# Patient Record
Sex: Female | Born: 1960
Health system: Southern US, Community
[De-identification: ages and names within clinical notes are randomized; demographics above are authoritative.]

## PROBLEM LIST (undated history)

## (undated) DIAGNOSIS — I10 Essential (primary) hypertension: Secondary | ICD-10-CM

## (undated) DIAGNOSIS — K250 Acute gastric ulcer with hemorrhage: Secondary | ICD-10-CM

## (undated) DIAGNOSIS — E1129 Type 2 diabetes mellitus with other diabetic kidney complication: Secondary | ICD-10-CM

## (undated) DIAGNOSIS — R519 Headache, unspecified: Secondary | ICD-10-CM

## (undated) DIAGNOSIS — K219 Gastro-esophageal reflux disease without esophagitis: Secondary | ICD-10-CM

## (undated) DIAGNOSIS — D62 Acute posthemorrhagic anemia: Secondary | ICD-10-CM

## (undated) DIAGNOSIS — K635 Polyp of colon: Secondary | ICD-10-CM

## (undated) DIAGNOSIS — R51 Headache: Secondary | ICD-10-CM

## (undated) DIAGNOSIS — K3189 Other diseases of stomach and duodenum: Secondary | ICD-10-CM

## (undated) DIAGNOSIS — D509 Iron deficiency anemia, unspecified: Secondary | ICD-10-CM

## (undated) DIAGNOSIS — K257 Chronic gastric ulcer without hemorrhage or perforation: Secondary | ICD-10-CM

## (undated) DIAGNOSIS — I129 Hypertensive chronic kidney disease with stage 1 through stage 4 chronic kidney disease, or unspecified chronic kidney disease: Secondary | ICD-10-CM

## (undated) DIAGNOSIS — F419 Anxiety disorder, unspecified: Secondary | ICD-10-CM

## (undated) DIAGNOSIS — R011 Cardiac murmur, unspecified: Secondary | ICD-10-CM

## (undated) DIAGNOSIS — R7303 Prediabetes: Secondary | ICD-10-CM

## (undated) HISTORY — DX: Acute gastric ulcer with hemorrhage: K25.0

## (undated) HISTORY — PX: COLONOSCOPY: SHX174

## (undated) HISTORY — DX: Other diseases of stomach and duodenum: K31.89

## (undated) HISTORY — DX: Hypertensive chronic kidney disease with stage 1 through stage 4 chronic kidney disease, or unspecified chronic kidney disease: I12.9

## (undated) HISTORY — DX: Cardiac murmur, unspecified: R01.1

## (undated) HISTORY — DX: Gastro-esophageal reflux disease without esophagitis: K21.9

## (undated) HISTORY — DX: Essential (primary) hypertension: I10

## (undated) HISTORY — DX: Iron deficiency anemia, unspecified: D50.9

## (undated) HISTORY — DX: Chronic gastric ulcer without hemorrhage or perforation: K25.7

## (undated) HISTORY — DX: Anxiety disorder, unspecified: F41.9

## (undated) HISTORY — DX: Type 2 diabetes mellitus with other diabetic kidney complication: E11.29

## (undated) HISTORY — DX: Acute posthemorrhagic anemia: D62

## (undated) HISTORY — PX: UPPER GASTROINTESTINAL ENDOSCOPY: SHX188

---

## 2004-12-15 ENCOUNTER — Other Ambulatory Visit: Payer: Self-pay

## 2004-12-15 ENCOUNTER — Emergency Department: Payer: Self-pay | Admitting: Emergency Medicine

## 2005-05-06 ENCOUNTER — Ambulatory Visit: Payer: Self-pay | Admitting: Internal Medicine

## 2005-05-08 ENCOUNTER — Ambulatory Visit: Payer: Self-pay | Admitting: Internal Medicine

## 2005-12-11 ENCOUNTER — Ambulatory Visit: Payer: Self-pay | Admitting: Gastroenterology

## 2005-12-18 ENCOUNTER — Other Ambulatory Visit: Payer: Self-pay

## 2005-12-25 ENCOUNTER — Ambulatory Visit: Payer: Self-pay | Admitting: Gastroenterology

## 2005-12-30 ENCOUNTER — Ambulatory Visit: Payer: Self-pay | Admitting: Gastroenterology

## 2006-02-01 ENCOUNTER — Ambulatory Visit: Payer: Self-pay | Admitting: Internal Medicine

## 2006-05-11 ENCOUNTER — Ambulatory Visit: Payer: Self-pay | Admitting: Internal Medicine

## 2008-03-13 ENCOUNTER — Ambulatory Visit: Payer: Self-pay | Admitting: Internal Medicine

## 2009-04-10 ENCOUNTER — Emergency Department: Payer: Self-pay

## 2010-08-12 ENCOUNTER — Ambulatory Visit: Payer: Self-pay | Admitting: Internal Medicine

## 2012-02-10 HISTORY — PX: BREAST BIOPSY: SHX20

## 2012-06-17 ENCOUNTER — Ambulatory Visit: Payer: Self-pay

## 2012-06-29 ENCOUNTER — Ambulatory Visit (INDEPENDENT_AMBULATORY_CARE_PROVIDER_SITE_OTHER): Payer: BC Managed Care – PPO | Admitting: General Surgery

## 2012-06-29 ENCOUNTER — Encounter: Payer: Self-pay | Admitting: General Surgery

## 2012-06-29 ENCOUNTER — Other Ambulatory Visit: Payer: Self-pay

## 2012-06-29 VITALS — BP 124/76 | HR 88 | Resp 16 | Ht 66.0 in | Wt 188.0 lb

## 2012-06-29 DIAGNOSIS — N63 Unspecified lump in unspecified breast: Secondary | ICD-10-CM

## 2012-06-29 NOTE — Progress Notes (Signed)
Patient ID: Tabitha Evans, female   DOB: December 26, 1960, 52 y.o.   MRN: [id number]  Chief Complaint  Patient presents with  . Other    mammogram    HPI Tabitha Evans is a 52 y.o. female here today for an following up mammogram and ultrasound  done on 06/17/12. Patient states no lumps and has never had any breast problems before. Patient dose perform self breast checks. No Family history of breast cancer.Patient states she is sore under her arm. Backspace the patient has a strong family history of breast cancer, but has been cleared of BRCA abnormality in 2011 under the care of Wyatt Haste, M.D.  HPI  Past Medical History  Diagnosis Date  . Hypertension   . Acid reflux     Past Surgical History  Procedure Laterality Date  . Colonoscopy      No family history on file.  Social History History  Substance Use Topics  . Smoking status: Not on file  . Smokeless tobacco: Never Used  . Alcohol Use: No    No Known Allergies  Current Outpatient Prescriptions  Medication Sig Dispense Refill  . Ferrous Sulfate (IRON) 325 (65 FE) MG TABS Take 1 tablet by mouth daily.      . Flaxseed, Linseed, (FLAX SEED OIL PO) Take 1 tablet by mouth daily.      Marland Kitchen omeprazole (PRILOSEC) 20 MG capsule Take 20 mg by mouth daily.      Marland Kitchen triamterene-hydrochlorothiazide (MAXZIDE-25) 37.5-25 MG per tablet        No current facility-administered medications for this visit.    Review of Systems Review of Systems  Constitutional: Negative.   Respiratory: Negative.   Cardiovascular: Negative.     Blood pressure 124/76, pulse 88, resp. rate 16, height 5\' 6"  (1.676 m), weight 188 lb (85.276 kg), last menstrual period 04/15/2012, not currently breastfeeding.  Physical Exam Physical Exam  Constitutional: She is oriented to person, place, and time. She appears well-developed and well-nourished.  Cardiovascular: Normal rate.   Murmur heard.  Systolic murmur is present with a grade of 2/6  Right upper  sternal border  Pulmonary/Chest: Effort normal and breath sounds normal. Right breast exhibits no inverted nipple, no mass, no nipple discharge, no skin change and no tenderness. Left breast exhibits no inverted nipple, no mass, no nipple discharge and no skin change.  Lymphadenopathy:    She has no cervical adenopathy.    She has no axillary adenopathy.  Neurological: She is alert and oriented to person, place, and time.  Skin: Skin is warm and dry.  1 cm skin thickening right breast  8 o'clock 6 CFN Tenderness lateral chest wall left breast along the course of the serratus muscle. No focal breast tenderness was noted.  Data Reviewed The patient's original screening mammograms were dated 05/31/2012. These were not available for review today.  Focal spot compression views dated 06/17/2012 showed a persistent density in the right breast. This measured 0.5 x 1.0 x 1.5 cm. This was oval-shaped with mixed echogenic and cystic areas. Internal vascularity was identified. This was felt to be indeterminate, BI-[id number].  Ultrasound examination of the right breast at the 8:00 position, 6 cm from the nipple showed a 0.6 x 0.92 x 0.9 cm fairly smoothly marginated nodule with a hyperechoic center and marked acoustic enhancement.   The patient was amenable to a vacuum assisted biopsy. 10 cc of 0.5% Xylocaine with 0.25% Marcaine with 1 200,000 of epinephrine was utilized well tolerated. A 10-gauge Encor device  was utilized. Pre-and post biopsy images were obtained. Approximately 10 core samples were obtained a postbiopsy clip was placed. There was minimal residual density after the biopsy was completed. Skin defect was closed with benzoin Steri-Strips followed by a Telfa and Tegaderm dressing. Assessment    Right breast nodule    Plan    The patient will be contacted when the pathology report is available. Assuming a benign result, a follow up right breast mammogram in 6 months will be obtained.  The left  axillary pain appears to be secondary to a musculoskeletal source. Local heat and OTC anti-inflammatories were recommended.       Earline Mayotte 06/30/2012, 8:13 AM

## 2012-06-29 NOTE — Patient Instructions (Addendum)
heating pad for comfort left breast area     CARE AFTER BREAST BIOPSY  1. Leave the dressing on that your doctor applied after surgery. It is waterproof. You may bathe, shower and/or swim. The dressing will probably remain intact until your return office visit. If the dressing comes off, you will see small strips of tape against your skin on the incision. Do not remove these strips.  2. You may want to use a gauze,cloth or similar protection in your bra to prevent rubbing against your dressing and incision. This is not necessary, but you may feel more comfortable doing so.  3. It is recommended that you wear a bra day and night to give support to the breast. This will prevent the weight of the breast from pulling on the incision.  4. Your breast will feel hard and lumpy under the incision. Do not be alarmed. This is the underlying stitching of tissue. Softening of this tissue will occur in time.  5. Make sure you call the office and schedule an appointment in one week after your surgery. The office phone number is [phone]. The nurses [facility] may have already done this for you.  6. You will notice about a week after your office visit that the strips of the tape on your incision will begin to loosen. These may then be removed.  7. Report to your doctor any of the following:  * Severe pain not relieved by your pain medication  *Redness of the incision  * Drainage from the incision  *Fever greater than 101 degrees  If biopsy normal then return in 6 months with mammogram and office visit

## 2012-06-30 ENCOUNTER — Encounter: Payer: Self-pay | Admitting: General Surgery

## 2012-06-30 DIAGNOSIS — N63 Unspecified lump in unspecified breast: Secondary | ICD-10-CM | POA: Insufficient documentation

## 2012-06-30 LAB — PATHOLOGY

## 2012-06-30 NOTE — Progress Notes (Signed)
Quick Note:  The patient was notified that the pathology was benign. She will follow up next week with a nurse as previously scheduled. Arrangements were made for a right breast mammogram and office visit in 6 months.  Cc: Marylu Lund Dear, M.D. ______

## 2012-07-01 ENCOUNTER — Other Ambulatory Visit: Payer: Self-pay | Admitting: *Deleted

## 2012-07-01 DIAGNOSIS — N63 Unspecified lump in unspecified breast: Secondary | ICD-10-CM

## 2012-07-01 NOTE — Progress Notes (Signed)
The patient has been asked to return to the office in six months for a unilateral right breast diagnostic mammogram. 

## 2012-07-07 ENCOUNTER — Ambulatory Visit (INDEPENDENT_AMBULATORY_CARE_PROVIDER_SITE_OTHER): Payer: BC Managed Care – PPO | Admitting: *Deleted

## 2012-07-07 DIAGNOSIS — N63 Unspecified lump in unspecified breast: Secondary | ICD-10-CM

## 2012-07-07 NOTE — Patient Instructions (Addendum)
The patient is aware that a heating pad may be used for comfort as needed.  Aware of pathology. Follow up as scheduled in 6 months with right mammogram and office visit

## 2012-07-07 NOTE — Progress Notes (Signed)
Patient here today for follow up post right breast biopsy.  Dressing and steristrip are off.  Minimal bruising noted.  The patient is aware that a heating pad may be used for comfort as needed.  Aware of pathology. Follow up as scheduled.

## 2013-01-19 ENCOUNTER — Ambulatory Visit: Payer: BC Managed Care – PPO | Admitting: General Surgery

## 2013-02-15 ENCOUNTER — Encounter: Payer: Self-pay | Admitting: *Deleted

## 2013-06-10 ENCOUNTER — Emergency Department: Payer: Self-pay | Admitting: Emergency Medicine

## 2013-06-10 LAB — URINALYSIS, COMPLETE
BACTERIA: NONE SEEN
BILIRUBIN, UR: NEGATIVE
Glucose,UR: NEGATIVE mg/dL (ref 0–75)
KETONE: NEGATIVE
Leukocyte Esterase: NEGATIVE
NITRITE: NEGATIVE
Ph: 5 (ref 4.5–8.0)
Protein: 30
SPECIFIC GRAVITY: 1.018 (ref 1.003–1.030)
SQUAMOUS EPITHELIAL: NONE SEEN
WBC UR: 13 /HPF (ref 0–5)

## 2013-11-28 ENCOUNTER — Emergency Department: Payer: Self-pay | Admitting: Emergency Medicine

## 2013-11-28 LAB — BASIC METABOLIC PANEL
ANION GAP: 10 (ref 7–16)
BUN: 13 mg/dL (ref 7–18)
CREATININE: 0.84 mg/dL (ref 0.60–1.30)
Calcium, Total: 9 mg/dL (ref 8.5–10.1)
Chloride: 100 mmol/L (ref 98–107)
Co2: 26 mmol/L (ref 21–32)
GLUCOSE: 118 mg/dL — AB (ref 65–99)
OSMOLALITY: 273 (ref 275–301)
POTASSIUM: 3.1 mmol/L — AB (ref 3.5–5.1)
SODIUM: 136 mmol/L (ref 136–145)

## 2013-11-28 LAB — TROPONIN I: Troponin-I: <0.02 ng/mL

## 2013-11-28 LAB — CBC
HCT: 43 % (ref 35.0–47.0)
HGB: 14.3 g/dL (ref 12.0–16.0)
MCH: 28.6 pg (ref 26.0–34.0)
MCHC: 33.3 g/dL (ref 32.0–36.0)
MCV: 86 fL (ref 80–100)
PLATELETS: 426 10*3/uL (ref 150–440)
RBC: 5 10*6/uL (ref 3.80–5.20)
RDW: 12.9 % (ref 11.5–14.5)
WBC: 10.3 10*3/uL (ref 3.6–11.0)

## 2013-12-11 ENCOUNTER — Encounter: Payer: Self-pay | Admitting: General Surgery

## 2014-04-19 ENCOUNTER — Ambulatory Visit: Payer: Self-pay | Admitting: Family Medicine

## 2014-05-22 ENCOUNTER — Ambulatory Visit
Admit: 2014-05-22 | Disposition: A | Discharge status: Home / Self Care | Payer: Self-pay | Attending: Family Medicine | Admitting: Family Medicine

## 2014-06-18 ENCOUNTER — Ambulatory Visit: Payer: Self-pay

## 2014-06-25 ENCOUNTER — Ambulatory Visit: Payer: Self-pay

## 2014-07-02 ENCOUNTER — Ambulatory Visit: Payer: Self-pay

## 2014-09-01 IMAGING — US ULTRASOUND RIGHT BREAST
1 series · 14 of 25 positions shown · non-contrast
Comparison: none

REASON FOR EXAM: av rt nodularity OUTSIDE
COMMENTS:

PROCEDURE:     US  - US BREAST RIGHT  - June 17, 2012 [DATE]
RESULT:     Focus right breast ultrasound dated 06/17/2012

[Series 1: ultrasound right breast · 0.10mm/px · 14 of 47 slices shown]
[im 1/47]
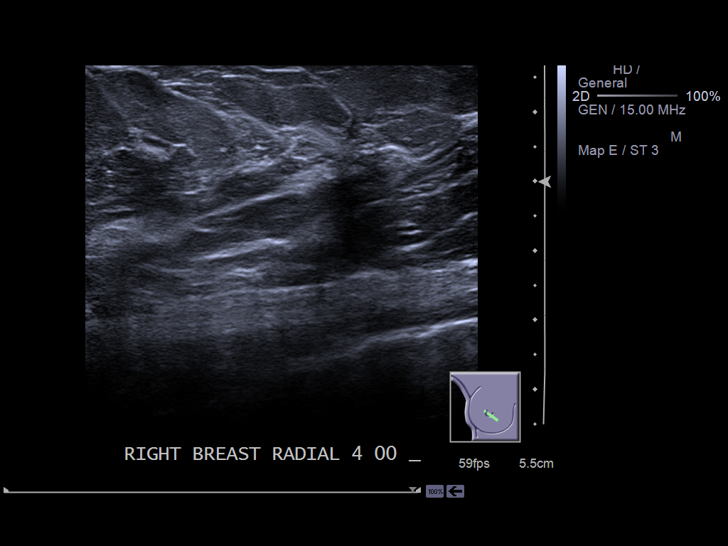
[im 4/47]
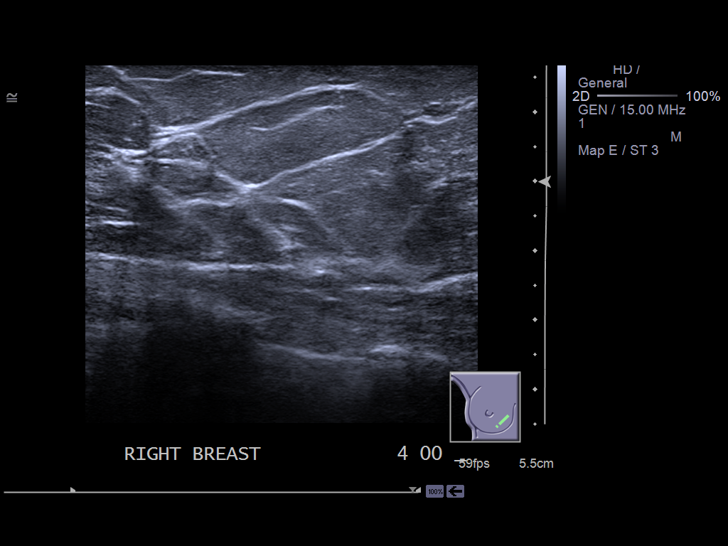
[im 8/47]
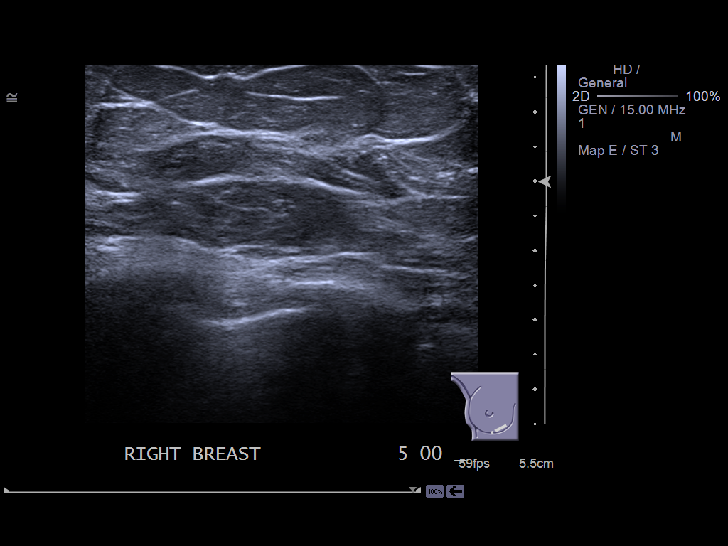
[im 12/47]
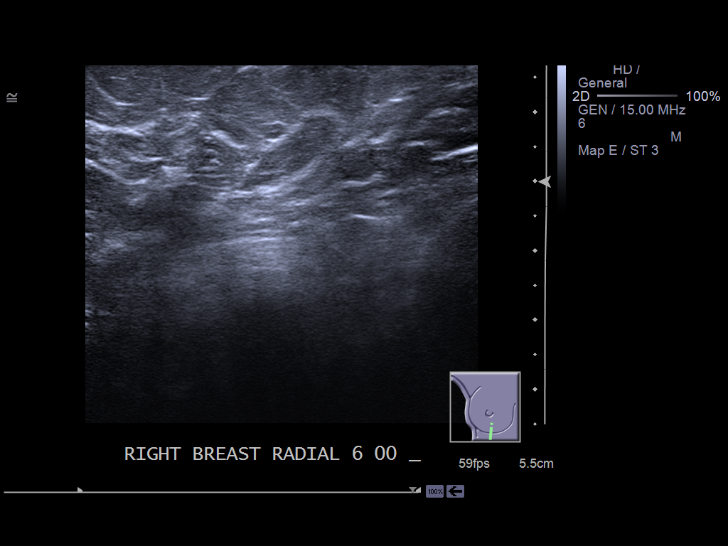
[im 16/47]
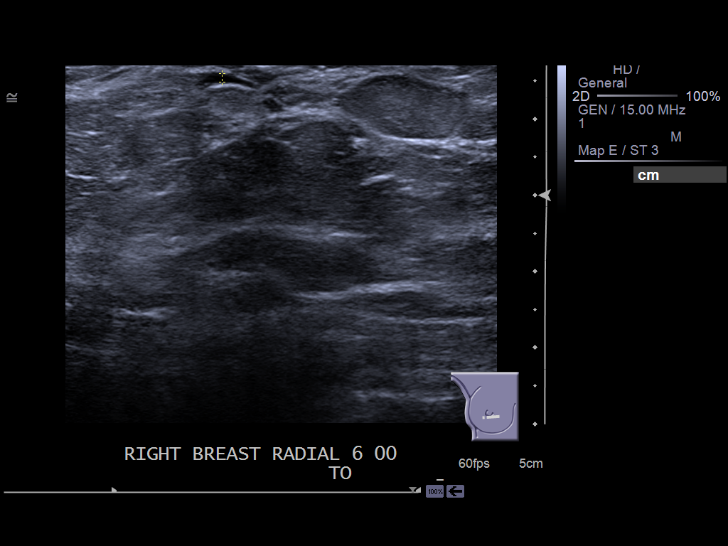
[im 18/47]
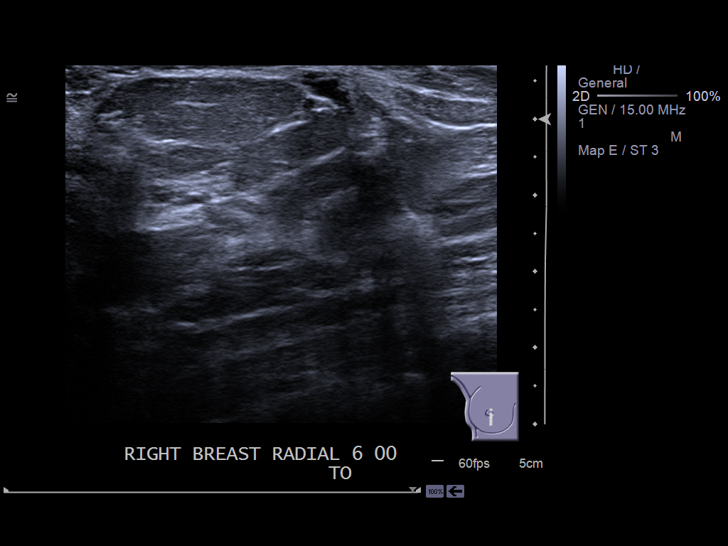
[im 22/47]
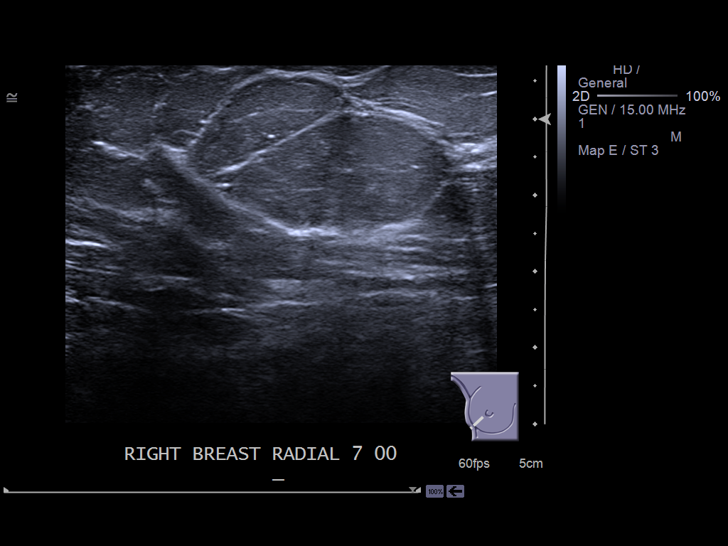
[im 25/47]
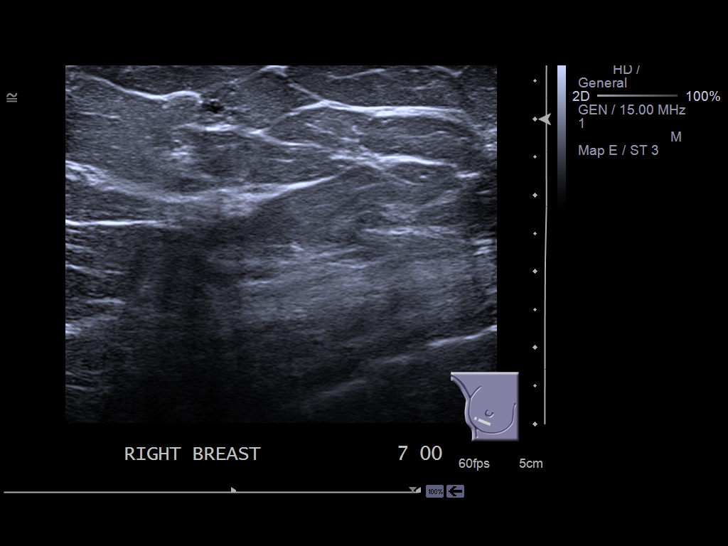
[im 29/47]
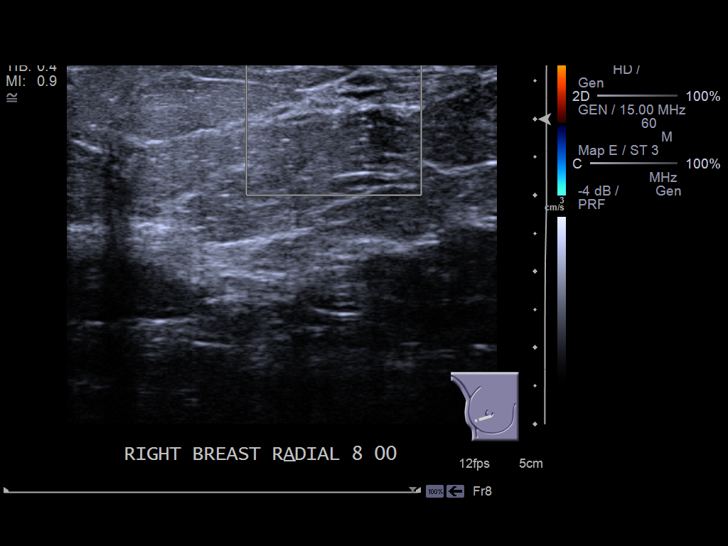
[im 31/47]
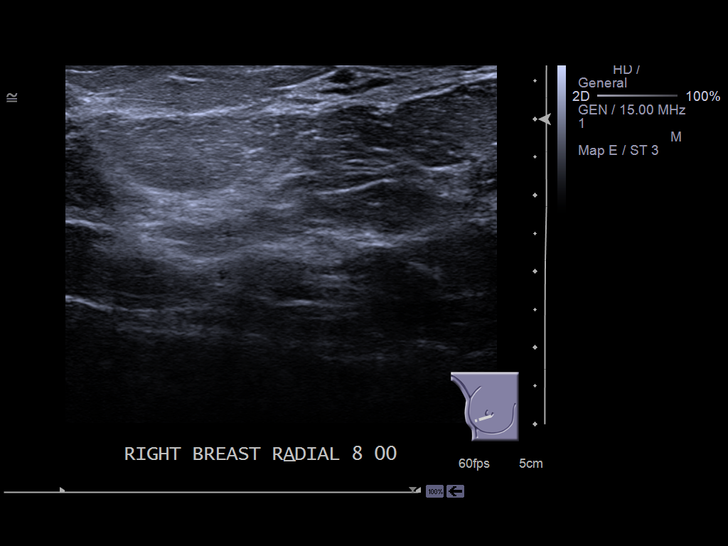
[im 35/47]
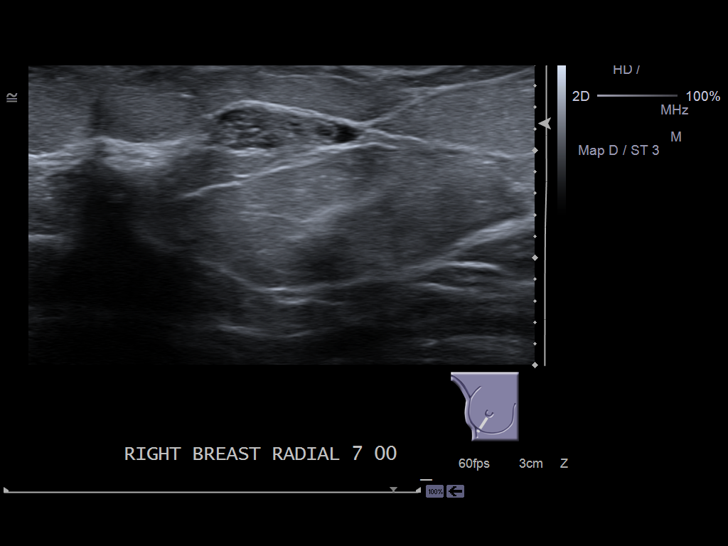
[im 39/47]
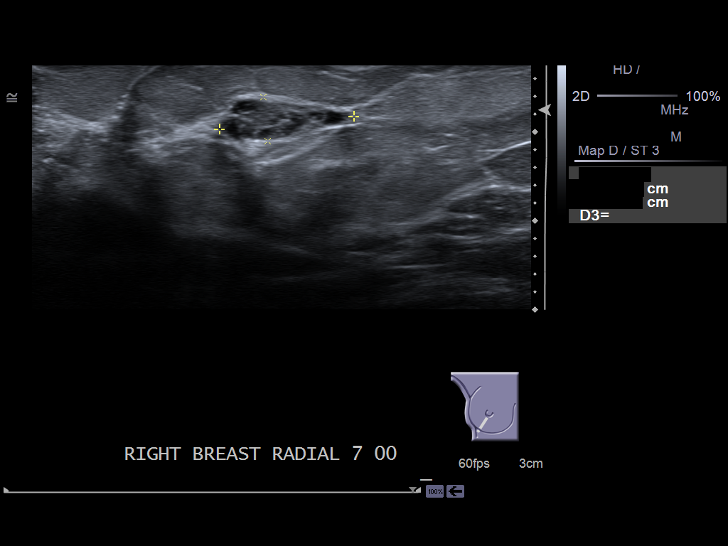
[im 43/47]
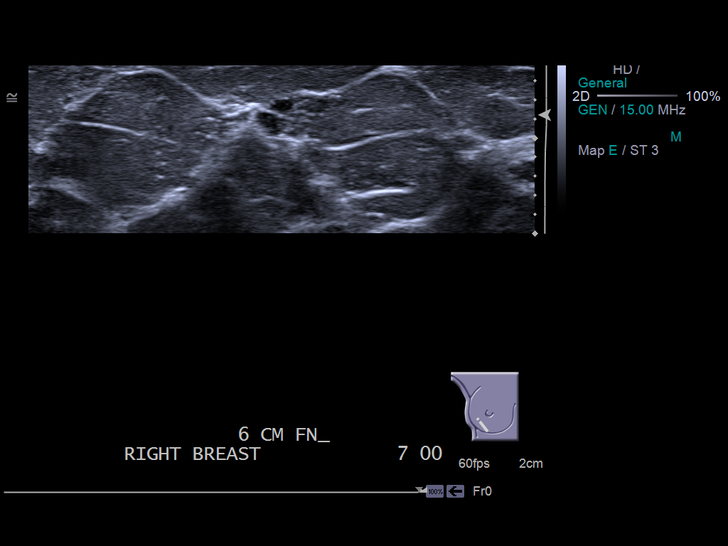
[im 47/47]
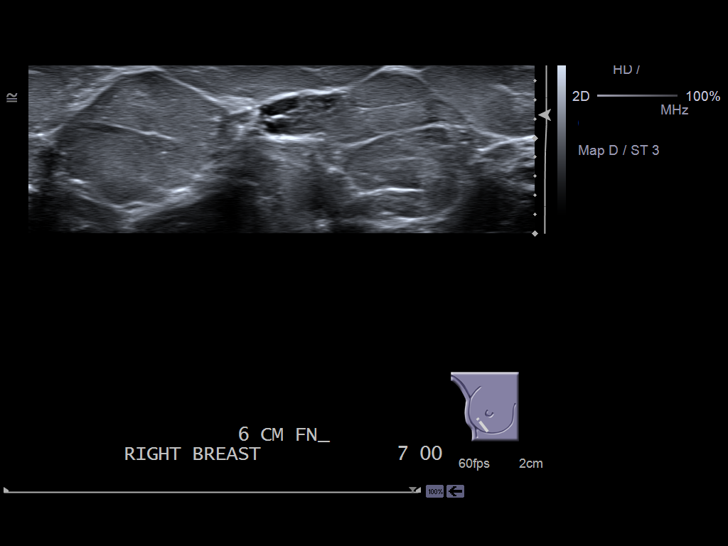

[14 of 25 positions shown; findings below may reference images not displayed]

FINDINGS: The right breast was evaluated in the region of interest on the 4
clock to the [DATE] position.

At the [DATE] position a oval-shaped mixed solid and cystic nodule is
appreciated measuring 1.52 x 0.5 x 0.99 cm. The borders a nodule primarily
smoothly marginated the nodule is wider than taller. There are areas of
vascularity within the nodule. The sonographic findings are indeterminate.
IMPRESSION: Indeterminate nodule at some o'clock position of the left
breast proximally 6 cm from the nipple please refer to the additional
radiographic view dictation for complete discussion.

## 2014-09-18 ENCOUNTER — Ambulatory Visit (INDEPENDENT_AMBULATORY_CARE_PROVIDER_SITE_OTHER): Payer: 59 | Admitting: Family Medicine

## 2014-09-18 ENCOUNTER — Encounter: Payer: Self-pay | Admitting: Family Medicine

## 2014-09-18 VITALS — BP 132/88 | HR 90 | Temp 98.3°F | Ht 64.4 in | Wt 192.6 lb

## 2014-09-18 DIAGNOSIS — E1129 Type 2 diabetes mellitus with other diabetic kidney complication: Secondary | ICD-10-CM | POA: Insufficient documentation

## 2014-09-18 DIAGNOSIS — I10 Essential (primary) hypertension: Secondary | ICD-10-CM

## 2014-09-18 DIAGNOSIS — Z13220 Encounter for screening for lipoid disorders: Secondary | ICD-10-CM | POA: Diagnosis not present

## 2014-09-18 DIAGNOSIS — D509 Iron deficiency anemia, unspecified: Secondary | ICD-10-CM | POA: Diagnosis not present

## 2014-09-18 DIAGNOSIS — E119 Type 2 diabetes mellitus without complications: Secondary | ICD-10-CM

## 2014-09-18 DIAGNOSIS — I129 Hypertensive chronic kidney disease with stage 1 through stage 4 chronic kidney disease, or unspecified chronic kidney disease: Secondary | ICD-10-CM | POA: Insufficient documentation

## 2014-09-18 DIAGNOSIS — B372 Candidiasis of skin and nail: Secondary | ICD-10-CM

## 2014-09-18 LAB — BAYER DCA HB A1C WAIVED: HB A1C: 5.8 % (ref <7.0)

## 2014-09-18 LAB — MICROALBUMIN, URINE WAIVED
Creatinine, Urine Waived: 100 mg/dL (ref 10–300)
MICROALB, UR WAIVED: 80 mg/L — AB (ref 0–19)

## 2014-09-18 MED ORDER — OMEPRAZOLE 20 MG PO CPDR: 20.0000 mg | DELAYED_RELEASE_CAPSULE | Freq: Every day | ORAL | Status: DC

## 2014-09-18 MED ORDER — NYSTATIN 100000 UNIT/GM EX CREA: 1.0000 "application " | TOPICAL_CREAM | Freq: Two times a day (BID) | CUTANEOUS | Status: DC

## 2014-09-18 MED ORDER — TRIAMCINOLONE ACETONIDE 0.1 % EX CREA: 1.0000 "application " | TOPICAL_CREAM | Freq: Two times a day (BID) | CUTANEOUS | Status: DC

## 2014-09-18 MED ORDER — TRIAMTERENE-HCTZ 37.5-25 MG PO TABS: 1.0000 | ORAL_TABLET | Freq: Every day | ORAL | Status: DC

## 2014-09-18 NOTE — Assessment & Plan Note (Signed)
Under good control at this time. Checking CMP today. Continue current regimen and recheck in 6 months.

## 2014-09-18 NOTE — Assessment & Plan Note (Signed)
Will treat with nystatin. Call if not getting better or getting worse.

## 2014-09-18 NOTE — Patient Instructions (Signed)
Diabetes and Standards of Medical Care Diabetes is complicated. You may find that your diabetes team includes a dietitian, nurse, diabetes educator, eye doctor, and more. To help everyone know what is going on and to help you get the care you deserve, the following schedule of care was developed to help keep you on track. Below are the tests, exams, vaccines, medicines, education, and plans you will need. HbA1c test This test shows how well you have controlled your glucose over the past 2-3 months. It is used to see if your diabetes management plan needs to be adjusted.   It is performed at least 2 times a year if you are meeting treatment goals.  It is performed 4 times a year if therapy has changed or if you are not meeting treatment goals. Blood pressure test  This test is performed at every routine medical visit. The goal is less than 140/90 mm Hg for most people, but 130/80 mm Hg in some cases. Ask your health care provider about your goal. Dental exam  Follow up with the dentist regularly. Eye exam  If you are diagnosed with type 1 diabetes as a child, get an exam upon reaching the age of 37 years or older and have had diabetes for 3-5 years. Yearly eye exams are recommended after that initial eye exam.  If you are diagnosed with type 1 diabetes as an adult, get an exam within 5 years of diagnosis and then yearly.  If you are diagnosed with type 2 diabetes, get an exam as soon as possible after the diagnosis and then yearly. Foot care exam  Visual foot exams are performed at every routine medical visit. The exams check for cuts, injuries, or other problems with the feet.  A comprehensive foot exam should be done yearly. This includes visual inspection as well as assessing foot pulses and testing for loss of sensation.  Check your feet nightly for cuts, injuries, or other problems with your feet. Tell your health care provider if anything is not healing. Kidney function test (urine  microalbumin)  This test is performed once a year.  Type 1 diabetes: The first test is performed 5 years after diagnosis.  Type 2 diabetes: The first test is performed at the time of diagnosis.  A serum creatinine and estimated glomerular filtration rate (eGFR) test is done once a year to assess the level of chronic kidney disease (CKD), if present. Lipid profile (cholesterol, HDL, LDL, triglycerides)  Performed every 5 years for most people.  The goal for LDL is less than 100 mg/dL. If you are at high risk, the goal is less than 70 mg/dL.  The goal for HDL is 40 mg/dL-50 mg/dL for men and 50 mg/dL-60 mg/dL for women. An HDL cholesterol of 60 mg/dL or higher gives some protection against heart disease.  The goal for triglycerides is less than 150 mg/dL. Influenza vaccine, pneumococcal vaccine, and hepatitis B vaccine  The influenza vaccine is recommended yearly.  It is recommended that people with diabetes who are over 24 years old get the pneumonia vaccine. In some cases, two separate shots may be given. Ask your health care provider if your pneumonia vaccination is up to date.  The hepatitis B vaccine is also recommended for adults with diabetes. Diabetes self-management education  Education is recommended at diagnosis and ongoing as needed. Treatment plan  Your treatment plan is reviewed at every medical visit. Document Released: 11/23/2008 Document Revised: 06/12/2013 Document Reviewed: 06/28/2012 [facility] Patient Information 2015 [place],  [employer]. This information is not intended to replace advice given to you by your health care provider. Make sure you discuss any questions you have with your health care provider.  

## 2014-09-18 NOTE — Progress Notes (Signed)
BP 132/88 mmHg  Pulse 90  Temp(Src) 98.3 F (36.8 C)  Ht 5' 4.4" (1.636 m)  Wt 192 lb 9.6 oz (87.363 kg)  BMI 32.64 kg/m2  SpO2 97%  LMP 09/01/2014 (Approximate)   Subjective:    Patient ID: Tabitha Evans, female    DOB: 1960/02/15, 54 y.o.   MRN: [id number]  HPI: Tabitha Evans is a 54 y.o. female  Chief Complaint  Patient presents with  . Rash    under left breast and back of neck, has been using the triamcinolone acetonide cream, which seem to help some.  . Diabetes   DIABETES- has not been doing well.  Hypoglycemic episodes:no Polydipsia/polyuria: no Visual disturbance: no Chest pain: no Paresthesias: no Glucose Monitoring: no Taking Insulin?: no Blood Pressure Monitoring: not checking Retinal Examination: Up to Date Foot Exam: Up to Date Diabetic Education: Completed Pneumovax: Up to Date Influenza: Postponed to Flu season Aspirin: yes  RASH Duration:  chronic just stated to bother her, under breasts have been there for a while off and on Location: under her L breast and at the back of her neck  Itching: yes Burning: yes Redness: no Oozing: no Scaling: no Blisters: no Painful: yes Fevers: no Change in detergents/soaps/personal care products: no Recent illness: no Recent travel:no History of same: yes Context: stable Alleviating factors: hydrocortisone cream- didn't get rid of it  Treatments attempted:hydrocortisone cream Shortness of breath: no  Throat/tongue swelling: no Myalgias/arthralgias: no  Relevant past medical, surgical, family and social history reviewed and updated as indicated. Interim medical history since our last visit reviewed. Allergies and medications reviewed and updated.  Review of Systems  Constitutional: Negative.   Respiratory: Negative.   Cardiovascular: Negative.   Gastrointestinal: Negative.   Psychiatric/Behavioral: Negative.    Per HPI unless specifically indicated above     Objective:    BP 132/88 mmHg   Pulse 90  Temp(Src) 98.3 F (36.8 C)  Ht 5' 4.4" (1.636 m)  Wt 192 lb 9.6 oz (87.363 kg)  BMI 32.64 kg/m2  SpO2 97%  LMP 09/01/2014 (Approximate)  Wt Readings from Last 3 Encounters:  09/18/14 192 lb 9.6 oz (87.363 kg)  06/29/12 188 lb (85.276 kg)    Physical Exam  Constitutional: She is oriented to person, place, and time. She appears well-developed and well-nourished. No distress.  HENT:  Head: Normocephalic and atraumatic.  Right Ear: Hearing normal.  Left Ear: Hearing normal.  Nose: Nose normal.  Eyes: Conjunctivae and lids are normal. Right eye exhibits no discharge. Left eye exhibits no discharge. No scleral icterus.  Cardiovascular: Normal rate, regular rhythm and normal heart sounds.  Exam reveals no gallop and no friction rub.   No murmur heard. Pulmonary/Chest: Effort normal and breath sounds normal. No respiratory distress. She has no wheezes. She has no rales. She exhibits no tenderness.  Musculoskeletal: Normal range of motion.  Neurological: She is alert and oriented to person, place, and time.  Skin: Skin is warm, dry and intact. Rash noted. No erythema. No pallor.  Pearly hyper pigmented rash on the back of her neck and under her breasts  Psychiatric: She has a normal mood and affect. Her speech is normal and behavior is normal. Judgment and thought content normal. Cognition and memory are normal.  Nursing note and vitals reviewed.     Assessment & Plan:   Problem List Items Addressed This Visit      Cardiovascular and Mediastinum   Essential hypertension    Under good control  at this time. Checking CMP today. Continue current regimen and recheck in 6 months.       Relevant Medications   aspirin 81 MG tablet   triamterene-hydrochlorothiazide (MAXZIDE-25) 37.5-25 MG per tablet   Other Relevant Orders   Comprehensive metabolic panel   Microalbumin, Urine Waived     Endocrine   Type 2 diabetes mellitus without complication - Primary    Has not been  working on diet and exercise. But A1c much better a 5.8! Start working on diet and exercise and we will recheck in 6 months.       Relevant Medications   aspirin 81 MG tablet   Other Relevant Orders   Comprehensive metabolic panel   Bayer DCA Hb A1c Waived   Microalbumin, Urine Waived     Musculoskeletal and Integument   Candidal intertrigo    Will treat with nystatin. Call if not getting better or getting worse.       Relevant Medications   nystatin cream (MYCOSTATIN)       Follow up plan: Return in about 6 months (around 03/21/2015) for Follow up.

## 2014-09-18 NOTE — Assessment & Plan Note (Signed)
Has not been working on diet and exercise. But A1c much better a 5.8! Start working on diet and exercise and we will recheck in 6 months.

## 2014-09-19 ENCOUNTER — Encounter: Payer: Self-pay | Admitting: Family Medicine

## 2014-09-19 LAB — COMPREHENSIVE METABOLIC PANEL
ALBUMIN: 4.5 g/dL (ref 3.5–5.5)
ALT: 46 IU/L — ABNORMAL HIGH (ref 0–32)
AST: 48 IU/L — ABNORMAL HIGH (ref 0–40)
Albumin/Globulin Ratio: 1.3 (ref 1.1–2.5)
Alkaline Phosphatase: 85 IU/L (ref 39–117)
BILIRUBIN TOTAL: 0.2 mg/dL (ref 0.0–1.2)
BUN / CREAT RATIO: 21 (ref 9–23)
BUN: 14 mg/dL (ref 6–24)
CO2: 23 mmol/L (ref 18–29)
CREATININE: 0.66 mg/dL (ref 0.57–1.00)
Calcium: 10 mg/dL (ref 8.7–10.2)
Chloride: 95 mmol/L — ABNORMAL LOW (ref 97–108)
GFR, EST AFRICAN AMERICAN: 118 mL/min/{1.73_m2} (ref >59)
GFR, EST NON AFRICAN AMERICAN: 103 mL/min/{1.73_m2} (ref >59)
GLOBULIN, TOTAL: 3.4 g/dL (ref 1.5–4.5)
Glucose: 131 mg/dL — ABNORMAL HIGH (ref 65–99)
POTASSIUM: 3.6 mmol/L (ref 3.5–5.2)
SODIUM: 138 mmol/L (ref 134–144)
TOTAL PROTEIN: 7.9 g/dL (ref 6.0–8.5)

## 2015-03-21 ENCOUNTER — Encounter: Payer: Self-pay | Admitting: Family Medicine

## 2015-03-21 ENCOUNTER — Ambulatory Visit (INDEPENDENT_AMBULATORY_CARE_PROVIDER_SITE_OTHER): Payer: BLUE CROSS/BLUE SHIELD | Admitting: Family Medicine

## 2015-03-21 VITALS — BP 140/90 | HR 91 | Temp 98.2°F | Ht 64.0 in | Wt 198.0 lb

## 2015-03-21 DIAGNOSIS — Z13220 Encounter for screening for lipoid disorders: Secondary | ICD-10-CM | POA: Diagnosis not present

## 2015-03-21 DIAGNOSIS — Z23 Encounter for immunization: Secondary | ICD-10-CM

## 2015-03-21 DIAGNOSIS — I10 Essential (primary) hypertension: Secondary | ICD-10-CM | POA: Diagnosis not present

## 2015-03-21 DIAGNOSIS — D509 Iron deficiency anemia, unspecified: Secondary | ICD-10-CM | POA: Diagnosis not present

## 2015-03-21 DIAGNOSIS — E119 Type 2 diabetes mellitus without complications: Secondary | ICD-10-CM | POA: Diagnosis not present

## 2015-03-21 MED ORDER — TRIAMTERENE-HCTZ 37.5-25 MG PO TABS: 1.0000 | ORAL_TABLET | Freq: Every day | ORAL | Status: DC

## 2015-03-21 MED ORDER — OMEPRAZOLE 20 MG PO CPDR: 20.0000 mg | DELAYED_RELEASE_CAPSULE | Freq: Every day | ORAL | Status: DC

## 2015-03-21 NOTE — Assessment & Plan Note (Signed)
A1c going in the wrong direction. Up to 7.2- will check again in 3 months. Work on diet and exercise.

## 2015-03-21 NOTE — Progress Notes (Signed)
BP 140/90 mmHg  Pulse 91  Temp(Src) 98.2 F (36.8 C)  Ht 5\' 4"  (1.626 m)  Wt 198 lb (89.812 kg)  BMI 33.97 kg/m2  SpO2 98%  LMP 09/01/2014 (Approximate)   Subjective:    Patient ID: Tabitha Evans, female    DOB: 1960/10/16, 55 y.o.   MRN: [id number]  HPI: Tabitha Evans is a 55 y.o. female  Chief Complaint  Patient presents with  . Hypertension  . Gastroesophageal Reflux   HYPERTENSION / [place] Satisfied with current treatment? yes Duration of hypertension: chronic BP monitoring frequency: not checking BP medication side effects: no Duration of hyperlipidemia: chronic Aspirin: no Recent stressors: yes Recurrent headaches: yes Visual changes: no Palpitations: yes Dyspnea: no Chest pain: no Lower extremity edema: no Dizzy/lightheaded: no  DIABETES Hypoglycemic episodes:no Polydipsia/polyuria: no Visual disturbance: no Chest pain: no Paresthesias: no Glucose Monitoring: no Blood Pressure Monitoring: not checking Retinal Examination: Up to Date Foot Exam: Up to Date Diabetic Education: Completed Pneumovax: Up to Date Influenza: Up to Date Aspirin: no  GERD GERD control status: controlled  Satisfied with current treatment? no Heartburn frequency: rarely Medication side effects: no  Medication compliance: good compliance Dysphagia: yes Odynophagia:  no Hematemesis: no Blood in stool: no EGD: no  Relevant past medical, surgical, family and social history reviewed and updated as indicated. Interim medical history since our last visit reviewed. Allergies and medications reviewed and updated.  Review of Systems  Constitutional: Negative.   Respiratory: Negative.   Cardiovascular: Negative.   Gastrointestinal: Negative.   Psychiatric/Behavioral: Negative.     Per HPI unless specifically indicated above     Objective:    BP 140/90 mmHg  Pulse 91  Temp(Src) 98.2 F (36.8 C)  Ht 5\' 4"  (1.626 m)  Wt 198 lb (89.812 kg)  BMI 33.97 kg/m2   SpO2 98%  LMP 09/01/2014 (Approximate)  Wt Readings from Last 3 Encounters:  03/21/15 198 lb (89.812 kg)  09/18/14 192 lb 9.6 oz (87.363 kg)  06/29/12 188 lb (85.276 kg)    Physical Exam  Constitutional: She is oriented to person, place, and time. She appears well-developed and well-nourished. No distress.  HENT:  Head: Normocephalic and atraumatic.  Right Ear: Hearing normal.  Left Ear: Hearing normal.  Nose: Nose normal.  Eyes: Conjunctivae and lids are normal. Right eye exhibits no discharge. Left eye exhibits no discharge. No scleral icterus.  Cardiovascular: Normal rate, regular rhythm, normal heart sounds and intact distal pulses.  Exam reveals no gallop and no friction rub.   No murmur heard. Pulmonary/Chest: Effort normal and breath sounds normal. No respiratory distress. She has no wheezes. She has no rales. She exhibits no tenderness.  Musculoskeletal: Normal range of motion.  Neurological: She is alert and oriented to person, place, and time.  Skin: Skin is warm, dry and intact. No rash noted. No erythema. No pallor.  Psychiatric: She has a normal mood and affect. Her speech is normal and behavior is normal. Judgment and thought content normal. Cognition and memory are normal.  Nursing note and vitals reviewed.   Results for orders placed or performed in visit on 09/18/14  Comprehensive metabolic panel  Result Value Ref Range   Glucose 131 (H) 65 - 99 mg/dL   BUN 14 6 - 24 mg/dL   Creatinine, Ser 0.66 0.57 - 1.00 mg/dL   GFR calc non Af Amer 103 >59 mL/min/1.73   GFR calc Af Amer 118 >59 mL/min/1.73   BUN/Creatinine Ratio 21 9 - 23  Sodium 138 134 - 144 mmol/L   Potassium 3.6 3.5 - 5.2 mmol/L   Chloride 95 (L) 97 - 108 mmol/L   CO2 23 18 - 29 mmol/L   Calcium 10.0 8.7 - 10.2 mg/dL   Total Protein 7.9 6.0 - 8.5 g/dL   Albumin 4.5 3.5 - 5.5 g/dL   Globulin, Total 3.4 1.5 - 4.5 g/dL   Albumin/Globulin Ratio 1.3 1.1 - 2.5   Bilirubin Total 0.2 0.0 - 1.2 mg/dL    Alkaline Phosphatase 85 39 - 117 IU/L   AST 48 (H) 0 - 40 IU/L   ALT 46 (H) 0 - 32 IU/L  Bayer DCA Hb A1c Waived  Result Value Ref Range   Bayer DCA Hb A1c Waived 5.8 <7.0 %  Microalbumin, Urine Waived  Result Value Ref Range   Microalb, Ur Waived 80 (H) 0 - 19 mg/L   Creatinine, Urine Waived 100 10 - 300 mg/dL   Microalb/Creat Ratio 30-300 (H) <30 mg/g      Assessment & Plan:   Problem List Items Addressed This Visit      Cardiovascular and Mediastinum   Essential hypertension    Not under the best control today. Will work on diet and exercise. DASH diet given today. Recheck in 3 months. Continue to monitor closely.       Relevant Medications   triamterene-hydrochlorothiazide (MAXZIDE-25) 37.5-25 MG tablet     Endocrine   Type 2 diabetes mellitus without complication (HCC) - Primary    A1c going in the wrong direction. Up to 7.2- will check again in 3 months. Work on diet and exercise.         Other   Iron deficiency anemia    Doing much better. Hgb up to 15.8 today. Continue to monitor.        Other Visit Diagnoses    Screening for cholesterol level        Labs checked today. Continue to monitor     Immunization due        Flu shot given today.    Relevant Orders    Flu Vaccine QUAD 36+ mos PF IM (Fluarix & Fluzone Quad PF) (Completed)        Follow up plan: Return in about 3 months (around 06/18/2015) for DM/HTN follow up.

## 2015-03-21 NOTE — Assessment & Plan Note (Signed)
Doing much better. Hgb up to 15.8 today. Continue to monitor.

## 2015-03-21 NOTE — Assessment & Plan Note (Signed)
Not under the best control today. Will work on diet and exercise. DASH diet given today. Recheck in 3 months. Continue to monitor closely.

## 2015-03-21 NOTE — Patient Instructions (Signed)
DASH Eating Plan  DASH stands for "Dietary Approaches to Stop Hypertension." The DASH eating plan is a healthy eating plan that has been shown to reduce high blood pressure (hypertension). Additional health benefits may include reducing the risk of type 2 diabetes mellitus, heart disease, and stroke. The DASH eating plan may also help with weight loss.  WHAT DO I NEED TO KNOW ABOUT THE DASH EATING PLAN?  For the DASH eating plan, you will follow these general guidelines:  · Choose foods with a percent daily value for sodium of less than 5% (as listed on the food label).  · Use salt-free seasonings or herbs instead of table salt or sea salt.  · Check with your health care provider or pharmacist before using salt substitutes.  · Eat lower-sodium products, often labeled as "lower sodium" or "no salt added."  · Eat fresh foods.  · Eat more vegetables, fruits, and low-fat dairy products.  · Choose whole grains. Look for the word "whole" as the first word in the ingredient list.  · Choose fish and skinless chicken or turkey more often than red meat. Limit fish, poultry, and meat to 6 oz (170 g) each day.  · Limit sweets, desserts, sugars, and sugary drinks.  · Choose heart-healthy fats.  · Limit cheese to 1 oz (28 g) per day.  · Eat more home-cooked food and less restaurant, buffet, and fast food.  · Limit fried foods.  · Cook foods using methods other than frying.  · Limit canned vegetables. If you do use them, rinse them well to decrease the sodium.  · When eating at a restaurant, ask that your food be prepared with less salt, or no salt if possible.  WHAT FOODS CAN I EAT?  Seek help from a dietitian for individual calorie needs.  Grains  Whole grain or whole wheat bread. Brown rice. Whole grain or whole wheat pasta. Quinoa, bulgur, and whole grain cereals. Low-sodium cereals. Corn or whole wheat flour tortillas. Whole grain cornbread. Whole grain crackers. Low-sodium crackers.  Vegetables  Fresh or frozen vegetables  (raw, steamed, roasted, or grilled). Low-sodium or reduced-sodium tomato and vegetable juices. Low-sodium or reduced-sodium tomato sauce and paste. Low-sodium or reduced-sodium canned vegetables.   Fruits  All fresh, canned (in natural juice), or frozen fruits.  Meat and Other Protein Products  Ground beef (85% or leaner), grass-fed beef, or beef trimmed of fat. Skinless chicken or turkey. Ground chicken or turkey. Pork trimmed of fat. All fish and seafood. Eggs. Dried beans, peas, or lentils. Unsalted nuts and seeds. Unsalted canned beans.  Dairy  Low-fat dairy products, such as skim or 1% milk, 2% or reduced-fat cheeses, low-fat ricotta or cottage cheese, or plain low-fat yogurt. Low-sodium or reduced-sodium cheeses.  Fats and Oils  Tub margarines without trans fats. Light or reduced-fat mayonnaise and salad dressings (reduced sodium). Avocado. Safflower, olive, or canola oils. Natural peanut or almond butter.  Other  Unsalted popcorn and pretzels.  The items listed above may not be a complete list of recommended foods or beverages. Contact your dietitian for more options.  WHAT FOODS ARE NOT RECOMMENDED?  Grains  White bread. White pasta. White rice. Refined cornbread. Bagels and croissants. Crackers that contain trans fat.  Vegetables  Creamed or fried vegetables. Vegetables in a cheese sauce. Regular canned vegetables. Regular canned tomato sauce and paste. Regular tomato and vegetable juices.  Fruits  Dried fruits. Canned fruit in light or heavy syrup. Fruit juice.  Meat and Other Protein   Products  Fatty cuts of meat. Ribs, chicken wings, bacon, sausage, bologna, salami, chitterlings, fatback, hot dogs, bratwurst, and packaged luncheon meats. Salted nuts and seeds. Canned beans with salt.  Dairy  Whole or 2% milk, cream, half-and-half, and cream cheese. Whole-fat or sweetened yogurt. Full-fat cheeses or blue cheese. Nondairy creamers and whipped toppings. Processed cheese, cheese spreads, or cheese  curds.  Condiments  Onion and garlic salt, seasoned salt, table salt, and sea salt. Canned and packaged gravies. Worcestershire sauce. Tartar sauce. Barbecue sauce. Teriyaki sauce. Soy sauce, including reduced sodium. Steak sauce. Fish sauce. Oyster sauce. Cocktail sauce. Horseradish. Ketchup and mustard. Meat flavorings and tenderizers. Bouillon cubes. Hot sauce. Tabasco sauce. Marinades. Taco seasonings. Relishes.  Fats and Oils  Butter, stick margarine, lard, shortening, ghee, and bacon fat. Coconut, palm kernel, or palm oils. Regular salad dressings.  Other  Pickles and olives. Salted popcorn and pretzels.  The items listed above may not be a complete list of foods and beverages to avoid. Contact your dietitian for more information.  WHERE CAN I FIND MORE INFORMATION?  National Heart, [employer], [employer]: www.nhlbi.nih.gov/health/health-topics/topics/dash/     This information is not intended to replace advice given to you by your health care provider. Make sure you discuss any questions you have with your health care provider.     Document Released: 01/15/2011 Document Revised: 02/16/2014 Document Reviewed: 11/30/2012  Elsevier Interactive Patient Education ©2016 [employer]

## 2015-03-22 ENCOUNTER — Encounter: Payer: Self-pay | Admitting: Family Medicine

## 2015-03-22 LAB — COMPREHENSIVE METABOLIC PANEL
ALT: 90 IU/L — ABNORMAL HIGH (ref 0–32)
AST: 76 IU/L — AB (ref 0–40)
Albumin/Globulin Ratio: 1.4 (ref 1.1–2.5)
Albumin: 4.5 g/dL (ref 3.5–5.5)
Alkaline Phosphatase: 90 IU/L (ref 39–117)
BUN/Creatinine Ratio: 19 (ref 9–23)
BUN: 13 mg/dL (ref 6–24)
Bilirubin Total: 0.4 mg/dL (ref 0.0–1.2)
CALCIUM: 10.2 mg/dL (ref 8.7–10.2)
CO2: 26 mmol/L (ref 18–29)
CREATININE: 0.68 mg/dL (ref 0.57–1.00)
Chloride: 96 mmol/L (ref 96–106)
GFR calc Af Amer: 116 mL/min/{1.73_m2} (ref >59)
GFR, EST NON AFRICAN AMERICAN: 101 mL/min/{1.73_m2} (ref >59)
Globulin, Total: 3.3 g/dL (ref 1.5–4.5)
Glucose: 133 mg/dL — ABNORMAL HIGH (ref 65–99)
POTASSIUM: 3.9 mmol/L (ref 3.5–5.2)
Sodium: 139 mmol/L (ref 134–144)
Total Protein: 7.8 g/dL (ref 6.0–8.5)

## 2015-03-22 LAB — TSH: TSH: 2.73 u[IU]/mL (ref 0.450–4.500)

## 2015-03-23 LAB — LIPID PANEL PICCOLO, WAIVED
Chol/HDL Ratio Piccolo,Waive: 3.8 mg/dL
Cholesterol Piccolo, Waived: 222 mg/dL — ABNORMAL HIGH (ref <200)
HDL CHOL PICCOLO, WAIVED: 58 mg/dL (ref >59)
LDL Chol Calc Piccolo Waived: 125 mg/dL — ABNORMAL HIGH (ref <100)
Triglycerides Piccolo,Waived: 196 mg/dL — ABNORMAL HIGH (ref <150)
VLDL Chol Calc Piccolo,Waive: 39 mg/dL — ABNORMAL HIGH (ref <30)

## 2015-03-23 LAB — MICROSCOPIC EXAMINATION: RBC, UA: NONE SEEN /hpf (ref 0–?)

## 2015-03-23 LAB — CBC WITH DIFFERENTIAL/PLATELET
HEMOGLOBIN: 15.7 g/dL (ref 11.1–15.9)
Hematocrit: 43.7 % (ref 34.0–46.6)
Lymphocytes Absolute: 2.7 10*3/uL (ref 0.7–3.1)
Lymphs: 33 %
MCH: 30.5 pg (ref 26.6–33.0)
MCHC: 35.9 g/dL — AB (ref 31.5–35.7)
MCV: 85 fL (ref 79–97)
MID (Absolute): 0.7 10*3/uL (ref 0.1–1.6)
MID: 9 %
NEUTROS ABS: 4.7 10*3/uL (ref 1.4–7.0)
NEUTROS PCT: 58 %
PLATELETS: 336 10*3/uL (ref 150–379)
RBC: 5.14 x10E6/uL (ref 3.77–5.28)
RDW: 13 % (ref 12.3–15.4)
WBC: 8.1 10*3/uL (ref 3.4–10.8)

## 2015-03-23 LAB — UA/M W/RFLX CULTURE, ROUTINE
Bilirubin, UA: NEGATIVE
Glucose, UA: NEGATIVE
KETONES UA: NEGATIVE
NITRITE UA: NEGATIVE
PH UA: 7 (ref 5.0–7.5)
Protein, UA: NEGATIVE
RBC UA: NEGATIVE
SPEC GRAV UA: 1.02 (ref 1.005–1.030)
Urobilinogen, Ur: 1 mg/dL (ref 0.2–1.0)

## 2015-03-23 LAB — BAYER DCA HB A1C WAIVED: HB A1C (BAYER DCA - WAIVED): 7.2 % — ABNORMAL HIGH (ref <7.0)

## 2015-03-23 LAB — URINE CULTURE, REFLEX

## 2015-03-25 ENCOUNTER — Telehealth: Payer: Self-pay | Admitting: Family Medicine

## 2015-03-25 NOTE — Telephone Encounter (Signed)
Please let her know that her urine did grow out a bacteria, but it doesn't usually cause any symptoms. If she has symptoms, we'll treat her, if not, she's good.

## 2015-03-25 NOTE — Telephone Encounter (Signed)
Patient states that she is not having any symptoms.

## 2015-06-18 ENCOUNTER — Other Ambulatory Visit: Payer: Self-pay | Admitting: Family Medicine

## 2015-06-18 ENCOUNTER — Ambulatory Visit (INDEPENDENT_AMBULATORY_CARE_PROVIDER_SITE_OTHER): Payer: BLUE CROSS/BLUE SHIELD | Admitting: Family Medicine

## 2015-06-18 ENCOUNTER — Encounter: Payer: Self-pay | Admitting: Family Medicine

## 2015-06-18 VITALS — BP 120/90 | HR 84 | Temp 98.2°F | Wt 197.0 lb

## 2015-06-18 DIAGNOSIS — E119 Type 2 diabetes mellitus without complications: Secondary | ICD-10-CM

## 2015-06-18 DIAGNOSIS — I10 Essential (primary) hypertension: Secondary | ICD-10-CM | POA: Diagnosis not present

## 2015-06-18 LAB — BAYER DCA HB A1C WAIVED: HB A1C (BAYER DCA - WAIVED): 7.8 % — ABNORMAL HIGH (ref <7.0)

## 2015-06-18 MED ORDER — TRIAMTERENE-HCTZ 37.5-25 MG PO TABS: 1.0000 | ORAL_TABLET | Freq: Every day | ORAL | Status: DC

## 2015-06-18 MED ORDER — METFORMIN HCL ER 500 MG PO TB24: 500.0000 mg | ORAL_TABLET | Freq: Every day | ORAL | Status: DC

## 2015-06-18 NOTE — Patient Instructions (Addendum)
Metformin extended-release tablets What is this medicine? METFORMIN (met FOR min) is used to treat type 2 diabetes. It helps to control blood sugar. Treatment is combined with diet and exercise. This medicine can be used alone or with other medicines for diabetes. This medicine may be used for other purposes; ask your health care provider or pharmacist if you have questions. What should I tell my health care provider before I take this medicine? They need to know if you have any of these conditions: -anemia -frequently drink alcohol-containing beverages -become easily dehydrated -heart attack -heart failure -kidney disease -liver disease -polycystic ovary syndrome -serious infection or injury -vomiting -an unusual or allergic reaction to metformin, other medicines, foods, dyes, or preservatives -pregnant or trying to get pregnant -breast-feeding How should I use this medicine? Take this medicine by mouth with a glass of water. Take it with meals. Swallow whole, do not crush or chew. Follow the directions on the prescription label. Take your medicine at regular intervals. Do not take your medicine more often than directed. Talk to your pediatrician regarding the use of this medicine in children. Special care may be needed. Overdosage: If you think you have taken too much of this medicine contact a poison control center or emergency room at once. NOTE: This medicine is only for you. Do not share this medicine with others. What if I miss a dose? If you miss a dose, take it as soon as you can. If it is almost time for your next dose, take only that dose. Do not take double or extra doses. What may interact with this medicine? Do not take this medicine with any of the following medications: -dofetilide -gatifloxacin -certain contrast medicines given before X-rays, CT scans, MRI, or other procedures This medicine may also interact with the following medications: -acetazolamide -certain  medicines for HIV infection or hepatitis, like adefovir, emtricitabine, entecavir, lamivudine, or tenofovir -cimetidine -crizotinib -digoxin -diuretics -female hormones, like estrogens or progestins and birth control pills -glycopyrrolate -isoniazid -lamotrigine -medicines for blood pressure, heart disease, irregular heart beat -memantine -midodrine -methazolamide -morphine -nicotinic acid -phenothiazines like chlorpromazine, mesoridazine, prochlorperazine, thioridazine -phenytoin -procainamide -propantheline -quinidine -quinine -ranitidine -ranolazine -steroid medicines like prednisone or cortisone -stimulant medicines for attention disorders, weight loss, or to stay awake -thyroid medicines -topiramate -trimethoprim -trospium -vancomycin -vandetanib -zonisamide This list may not describe all possible interactions. Give your health care provider a list of all the medicines, herbs, non-prescription drugs, or dietary supplements you use. Also tell them if you smoke, drink alcohol, or use illegal drugs. Some items may interact with your medicine. What should I watch for while using this medicine? Visit your doctor or health care professional for regular checks on your progress. A test called the HbA1C (A1C) will be monitored. This is a simple blood test. It measures your blood sugar control over the last 2 to 3 months. You will receive this test every 3 to 6 months. Learn how to check your blood sugar. Learn the symptoms of low and high blood sugar and how to manage them. Always carry a quick-source of sugar with you in case you have symptoms of low blood sugar. Examples include hard sugar candy or glucose tablets. Make sure others know that you can choke if you eat or drink when you develop serious symptoms of low blood sugar, such as seizures or unconsciousness. They must get medical help at once. Tell your doctor or health care professional if you have high blood sugar. You  might need to  change the dose of your medicine. If you are sick or exercising more than usual, you might need to change the dose of your medicine. Do not skip meals. Ask your doctor or health care professional if you should avoid alcohol. Many nonprescription cough and cold products contain sugar or alcohol. These can affect blood sugar. This medicine may cause ovulation in premenopausal women who do not have regular monthly periods. This may increase your chances of becoming pregnant. You should not take this medicine if you become pregnant or think you may be pregnant. Talk with your doctor or health care professional about your birth control options while taking this medicine. Contact your doctor or health care professional right away if think you are pregnant. The tablet shell for some brands of this medicine does not dissolve. This is normal. The tablet shell may appear whole in the stool. This is not a cause for concern. If you are going to need surgery, a MRI, CT scan, or other procedure, tell your doctor that you are taking this medicine. You may need to stop taking this medicine before the procedure. Wear a medical ID bracelet or chain, and carry a card that describes your disease and details of your medicine and dosage times. What side effects may I notice from receiving this medicine? Side effects that you should report to your doctor or health care professional as soon as possible: -allergic reactions like skin rash, itching or hives, swelling of the face, lips, or tongue -breathing problems -feeling faint or lightheaded, falls -muscle aches or pains -signs and symptoms of low blood sugar such as feeling anxious, confusion, dizziness, increased hunger, unusually weak or tired, sweating, shakiness, cold, irritable, headache, blurred vision, fast heartbeat, loss of consciousness -slow or irregular heartbeat -unusual stomach pain or discomfort -unusually tired or weak Side effects that  usually do not require medical attention (report to your doctor or health care professional if they continue or are bothersome): -diarrhea -headache -heartburn -metallic taste in mouth -nausea -stomach gas, upset This list may not describe all possible side effects. Call your doctor for medical advice about side effects. You may report side effects to FDA at 1-800-FDA-1088. Where should I keep my medicine? Keep out of the reach of children. Store at room temperature between 15 and 30 degrees C (59 and 86 degrees F). Protect from light. Throw away any unused medicine after the expiration date. NOTE: This sheet is a summary. It may not cover all possible information. If you have questions about this medicine, talk to your doctor, pharmacist, or health care provider.    2016, Elsevier/Gold Standard. (2013-07-11 22:12:16) Stress and Stress Management Stress is a normal reaction to life events. It is what you feel when life demands more than you are used to or more than you can handle. Some stress can be useful. For example, the stress reaction can help you catch the last bus of the day, study for a test, or meet a deadline at work. But stress that occurs too often or for too long can cause problems. It can affect your emotional health and interfere with relationships and normal daily activities. Too much stress can weaken your immune system and increase your risk for physical illness. If you already have a medical problem, stress can make it worse. CAUSES  All sorts of life events may cause stress. An event that causes stress for one person may not be stressful for another person. Major life events commonly cause stress. These may be  positive or negative. Examples include losing your job, moving into a new home, getting married, having a baby, or losing a loved one. Less obvious life events may also cause stress, especially if they occur day after day or in combination. Examples include working long  hours, driving in traffic, caring for children, being in debt, or being in a difficult relationship. SIGNS AND SYMPTOMS Stress may cause emotional symptoms including, the following:  Anxiety. This is feeling worried, afraid, on edge, overwhelmed, or out of control.  Anger. This is feeling irritated or impatient.  Depression. This is feeling sad, down, helpless, or guilty.  Difficulty focusing, remembering, or making decisions. Stress may cause physical symptoms, including the following:   Aches and pains. These may affect your head, neck, back, stomach, or other areas of your body.  Tight muscles or clenched jaw.  Low energy or trouble sleeping. Stress may cause unhealthy behaviors, including the following:   Eating to feel better (overeating) or skipping meals.  Sleeping too little, too much, or both.  Working too much or putting off tasks (procrastination).  Smoking, drinking alcohol, or using drugs to feel better. DIAGNOSIS  Stress is diagnosed through an assessment by your health care provider. Your health care provider will ask questions about your symptoms and any stressful life events.Your health care provider will also ask about your medical history and may order blood tests or other tests. Certain medical conditions and medicine can cause physical symptoms similar to stress. Mental illness can cause emotional symptoms and unhealthy behaviors similar to stress. Your health care provider may refer you to a mental health professional for further evaluation.  TREATMENT  Stress management is the recommended treatment for stress.The goals of stress management are reducing stressful life events and coping with stress in healthy ways.  Techniques for reducing stressful life events include the following:  Stress identification. Self-monitor for stress and identify what causes stress for you. These skills may help you to avoid some stressful events.  Time management. Set your  priorities, keep a calendar of events, and learn to say "no." These tools can help you avoid making too many commitments. Techniques for coping with stress include the following:  Rethinking the problem. Try to think realistically about stressful events rather than ignoring them or overreacting. Try to find the positives in a stressful situation rather than focusing on the negatives.  Exercise. Physical exercise can release both physical and emotional tension. The key is to find a form of exercise you enjoy and do it regularly.  Relaxation techniques. These relax the body and mind. Examples include yoga, meditation, tai chi, biofeedback, deep breathing, progressive muscle relaxation, listening to music, being out in nature, journaling, and other hobbies. Again, the key is to find one or more that you enjoy and can do regularly.  Healthy lifestyle. Eat a balanced diet, get plenty of sleep, and do not smoke. Avoid using alcohol or drugs to relax.  Strong support network. Spend time with family, friends, or other people you enjoy being around.Express your feelings and talk things over with someone you trust. Counseling or talktherapy with a mental health professional may be helpful if you are having difficulty managing stress on your own. Medicine is typically not recommended for the treatment of stress.Talk to your health care provider if you think you need medicine for symptoms of stress. HOME CARE INSTRUCTIONS  Keep all follow-up visits as directed by your health care provider.  Take all medicines as directed by your health  care provider. SEEK MEDICAL CARE IF:  Your symptoms get worse or you start having new symptoms.  You feel overwhelmed by your problems and can no longer manage them on your own. SEEK IMMEDIATE MEDICAL CARE IF:  You feel like hurting yourself or someone else.   This information is not intended to replace advice given to you by your health care provider. Make sure you  discuss any questions you have with your health care provider.   Document Released: 07/22/2000 Document Revised: 02/16/2014 Document Reviewed: 09/20/2012 Elsevier Interactive Patient Education [employer].

## 2015-06-18 NOTE — Progress Notes (Signed)
BP 120/90 mmHg  Pulse 84  Temp(Src) 98.2 F (36.8 C)  Wt 197 lb (89.359 kg)  SpO2 100%  LMP 09/01/2014 (Approximate)   Subjective:    Patient ID: Tabitha Evans, female    DOB: 11/03/1960, 55 y.o.   MRN: [id number]  HPI: Tabitha Evans is a 55 y.o. female  Chief Complaint  Patient presents with  . Hypertension  . Diabetes   DIABETES Hypoglycemic episodes:no Polydipsia/polyuria: yes Visual disturbance: no Chest pain: no Paresthesias: no Glucose Monitoring: no Taking Insulin?: no Blood Pressure Monitoring: not checking Retinal Examination: Not up to Date Foot Exam: Up to Date Diabetic Education: Completed Pneumovax: Up to Date Influenza: Up to Date Aspirin: yes  HYPERTENSION Hypertension status: stable  Satisfied with current treatment? yes Duration of hypertension: chronic BP monitoring frequency:  not checking BP medication side effects:  no Medication compliance: excellent compliance Aspirin: yes Recurrent headaches: no Visual changes: no Palpitations: no Dyspnea: no Chest pain: no Lower extremity edema: no Dizzy/lightheaded: no  Relevant past medical, surgical, family and social history reviewed and updated as indicated. Interim medical history since our last visit reviewed. Allergies and medications reviewed and updated.  Review of Systems  Constitutional: Negative.   Respiratory: Negative.   Cardiovascular: Negative.   Psychiatric/Behavioral: Negative.     Per HPI unless specifically indicated above     Objective:    BP 120/90 mmHg  Pulse 84  Temp(Src) 98.2 F (36.8 C)  Wt 197 lb (89.359 kg)  SpO2 100%  LMP 09/01/2014 (Approximate)  Wt Readings from Last 3 Encounters:  06/18/15 197 lb (89.359 kg)  03/21/15 198 lb (89.812 kg)  09/18/14 192 lb 9.6 oz (87.363 kg)    Physical Exam  Constitutional: She is oriented to person, place, and time. She appears well-developed and well-nourished. No distress.  HENT:  Head: Normocephalic and  atraumatic.  Right Ear: Hearing normal.  Left Ear: Hearing normal.  Nose: Nose normal.  Eyes: Conjunctivae and lids are normal. Right eye exhibits no discharge. Left eye exhibits no discharge. No scleral icterus.  Cardiovascular: Normal rate, regular rhythm, normal heart sounds and intact distal pulses.  Exam reveals no gallop and no friction rub.   No murmur heard. Pulmonary/Chest: Effort normal and breath sounds normal. No respiratory distress. She has no wheezes. She has no rales. She exhibits no tenderness.  Musculoskeletal: Normal range of motion.  Neurological: She is alert and oriented to person, place, and time.  Skin: Skin is warm, dry and intact. No rash noted. No erythema. No pallor.  Psychiatric: She has a normal mood and affect. Her speech is normal and behavior is normal. Judgment and thought content normal. Cognition and memory are normal.  Nursing note and vitals reviewed.   Results for orders placed or performed in visit on 03/21/15  Microscopic Examination  Result Value Ref Range   WBC, UA 0-5 0 -  5 /hpf   RBC, UA None seen 0 -  2 /hpf   Epithelial Cells (non renal) 0-10 0 - 10 /hpf   Bacteria, UA Few None seen/Few  Comprehensive metabolic panel  Result Value Ref Range   Glucose 133 (H) 65 - 99 mg/dL   BUN 13 6 - 24 mg/dL   Creatinine, Ser 0.68 0.57 - 1.00 mg/dL   GFR calc non Af Amer 101 >59 mL/min/1.73   GFR calc Af Amer 116 >59 mL/min/1.73   BUN/Creatinine Ratio 19 9 - 23   Sodium 139 134 - 144 mmol/L  Potassium 3.9 3.5 - 5.2 mmol/L   Chloride 96 96 - 106 mmol/L   CO2 26 18 - 29 mmol/L   Calcium 10.2 8.7 - 10.2 mg/dL   Total Protein 7.8 6.0 - 8.5 g/dL   Albumin 4.5 3.5 - 5.5 g/dL   Globulin, Total 3.3 1.5 - 4.5 g/dL   Albumin/Globulin Ratio 1.4 1.1 - 2.5   Bilirubin Total 0.4 0.0 - 1.2 mg/dL   Alkaline Phosphatase 90 39 - 117 IU/L   AST 76 (H) 0 - 40 IU/L   ALT 90 (H) 0 - 32 IU/L  Bayer DCA Hb A1c Waived  Result Value Ref Range   Bayer DCA Hb A1c  Waived 7.2 (H) <7.0 %  Lipid Panel Piccolo, Waived  Result Value Ref Range   Cholesterol Piccolo, Waived 222 (H) <200 mg/dL   HDL Chol Piccolo, Waived 58 >59 mg/dL   Triglycerides Piccolo,Waived 196 (H) <150 mg/dL   Chol/HDL Ratio Piccolo,Waive 3.8 mg/dL   LDL Chol Calc Piccolo Waived 125 (H) <100 mg/dL   VLDL Chol Calc Piccolo,Waive 39 (H) <30 mg/dL  TSH  Result Value Ref Range   TSH 2.730 0.450 - 4.500 uIU/mL  UA/M w/rflx Culture, Routine  Result Value Ref Range   Specific Gravity, UA 1.020 1.005 - 1.030   pH, UA 7.0 5.0 - 7.5   Color, UA Yellow Yellow   Appearance Ur Clear Clear   Leukocytes, UA Trace (A) Negative   Protein, UA Negative Negative/Trace   Glucose, UA Negative Negative   Ketones, UA Negative Negative   RBC, UA Negative Negative   Bilirubin, UA Negative Negative   Urobilinogen, Ur 1.0 0.2 - 1.0 mg/dL   Nitrite, UA Negative Negative   Microscopic Examination See below:    Urinalysis Reflex Comment   Urine Culture, Routine  Result Value Ref Range   Urine Culture, Routine Final report (A)    Urine Culture result 1 Comment (A)   CBC With Differential/Platelet  Result Value Ref Range   WBC 8.1 3.4 - 10.8 x10E3/uL   RBC 5.14 3.77 - 5.28 x10E6/uL   Hemoglobin 15.7 11.1 - 15.9 g/dL   Hematocrit 43.7 34.0 - 46.6 %   MCV 85 79 - 97 fL   MCH 30.5 26.6 - 33.0 pg   MCHC 35.9 (H) 31.5 - 35.7 g/dL   RDW 13.0 12.3 - 15.4 %   Platelets 336 150 - 379 x10E3/uL   Neutrophils 58 %   Lymphs 33 %   MID 9 %   Neutrophils Absolute 4.7 1.4 - 7.0 x10E3/uL   Lymphocytes Absolute 2.7 0.7 - 3.1 x10E3/uL   MID (Absolute) 0.7 0.1 - 1.6 X10E3/uL      Assessment & Plan:   Problem List Items Addressed This Visit      Cardiovascular and Mediastinum   Essential hypertension - Primary    Better on recheck. Continue current regimen. Continue to monitor. Call with any concerns.       Relevant Medications   triamterene-hydrochlorothiazide (MAXZIDE-25) 37.5-25 MG tablet      Endocrine   Type 2 diabetes mellitus without complication (HCC)    123456 up to 7.8. Will start her on metformin. Recheck 1 month. Call with any concerns.      Relevant Medications   metFORMIN (GLUCOPHAGE XR) 500 MG 24 hr tablet       Follow up plan: Return in about 4 weeks (around 07/16/2015) for follow up metformin.

## 2015-06-18 NOTE — Assessment & Plan Note (Signed)
A1c up to 7.8. Will start her on metformin. Recheck 1 month. Call with any concerns.

## 2015-06-18 NOTE — Assessment & Plan Note (Signed)
Better on recheck. Continue current regimen. Continue to monitor. Call with any concerns.  

## 2015-07-23 ENCOUNTER — Ambulatory Visit (INDEPENDENT_AMBULATORY_CARE_PROVIDER_SITE_OTHER): Payer: BLUE CROSS/BLUE SHIELD | Admitting: Family Medicine

## 2015-07-23 ENCOUNTER — Encounter: Payer: Self-pay | Admitting: Family Medicine

## 2015-07-23 VITALS — BP 147/89 | HR 84 | Temp 97.7°F | Ht 64.3 in | Wt 198.0 lb

## 2015-07-23 DIAGNOSIS — F419 Anxiety disorder, unspecified: Secondary | ICD-10-CM | POA: Insufficient documentation

## 2015-07-23 DIAGNOSIS — E119 Type 2 diabetes mellitus without complications: Secondary | ICD-10-CM | POA: Diagnosis not present

## 2015-07-23 DIAGNOSIS — F411 Generalized anxiety disorder: Secondary | ICD-10-CM | POA: Diagnosis not present

## 2015-07-23 MED ORDER — SERTRALINE HCL 50 MG PO TABS: ORAL_TABLET | ORAL | Status: DC

## 2015-07-23 NOTE — Assessment & Plan Note (Signed)
Discussed options. Would like to start zoloft. Follow up in 1 month.

## 2015-07-23 NOTE — Progress Notes (Signed)
BP 147/89 mmHg  Pulse 84  Temp(Src) 97.7 F (36.5 C)  Ht 5' 4.3" (1.633 m)  Wt 198 lb (89.812 kg)  BMI 33.68 kg/m2  SpO2 98%  LMP 09/01/2014 (Approximate)   Subjective:    Patient ID: Tabitha Evans, female    DOB: [birth year], 55 y.o.   MRN: [id number]  HPI: Tabitha Evans is a 55 y.o. female  Chief Complaint  Patient presents with  . Diabetes   DIABETES Hypoglycemic episodes:no Polydipsia/polyuria: no Visual disturbance: yes- slight Chest pain: no Paresthesias: no Glucose Monitoring: no  Accucheck frequency: Not Checking Taking Insulin?: no Blood Pressure Monitoring: not checking Retinal Examination: Not up to Date Foot Exam: Up to Date Diabetic Education: Completed Pneumovax: up to date Influenza: Up to Date Aspirin: no   ANXIETY/STRESS- since she started the new job, been at the new job 25 years Duration:exacerbated Anxious mood: yes  Excessive worrying: yes Irritability: yes  Sweating: no Nausea: no Palpitations:yes Hyperventilation: no Panic attacks: no Agoraphobia: no  Obscessions/compulsions: no Depressed mood: yes Depression screen PHQ 2/9 06/18/2015  Decreased Interest 0  Down, Depressed, Hopeless 0  PHQ - 2 Score 0   Anhedonia: no Weight changes: no Insomnia: yes hard to stay asleep  Hypersomnia: no Fatigue/loss of energy: yes Feelings of worthlessness: yes Feelings of guilt: yes Impaired concentration/indecisiveness: yes Suicidal ideations: no  Crying spells: no Recent Stressors/Life Changes: yes   Relationship problems: no   Family stress: no     Financial stress: no    Job stress: yes    Recent death/loss: no   Relevant past medical, surgical, family and social history reviewed and updated as indicated. Interim medical history since our last visit reviewed. Allergies and medications reviewed and updated.  Review of Systems  Constitutional: Negative.   Respiratory: Negative.   Cardiovascular: Negative.    Psychiatric/Behavioral: Positive for confusion, sleep disturbance, dysphoric mood and decreased concentration. Negative for suicidal ideas, hallucinations, behavioral problems, self-injury and agitation. The patient is nervous/anxious. The patient is not hyperactive.     Per HPI unless specifically indicated above     Objective:    BP 147/89 mmHg  Pulse 84  Temp(Src) 97.7 F (36.5 C)  Ht 5' 4.3" (1.633 m)  Wt 198 lb (89.812 kg)  BMI 33.68 kg/m2  SpO2 98%  LMP 09/01/2014 (Approximate)  Wt Readings from Last 3 Encounters:  07/23/15 198 lb (89.812 kg)  06/18/15 197 lb (89.359 kg)  03/21/15 198 lb (89.812 kg)    Physical Exam  Constitutional: She is oriented to person, place, and time. She appears well-developed and well-nourished. No distress.  HENT:  Head: Normocephalic and atraumatic.  Right Ear: Hearing normal.  Left Ear: Hearing normal.  Nose: Nose normal.  Eyes: Conjunctivae and lids are normal. Right eye exhibits no discharge. Left eye exhibits no discharge. No scleral icterus.  Pulmonary/Chest: Effort normal. No respiratory distress.  Musculoskeletal: Normal range of motion.  Neurological: She is alert and oriented to person, place, and time.  Skin: Skin is warm, dry and intact. No rash noted. No erythema. No pallor.  Psychiatric: She has a normal mood and affect. Her speech is normal and behavior is normal. Judgment and thought content normal. Cognition and memory are normal.  Nursing note and vitals reviewed.   Results for orders placed or performed in visit on 06/18/15  Bayer DCA Hb A1c Waived  Result Value Ref Range   Bayer DCA Hb A1c Waived 7.8 (H) <7.0 %  Assessment & Plan:   Problem List Items Addressed This Visit      Endocrine   Type 2 diabetes mellitus without complication ([place]) - Primary    Rechecking BMP. Continue metformin. Recheck 1 month.       Relevant Orders   Basic metabolic panel     Other   Anxiety disorder    Discussed options.  Would like to start zoloft. Follow up in 1 month.           Follow up plan: Return in about 4 weeks (around 08/20/2015) for follow up mood.

## 2015-07-23 NOTE — Assessment & Plan Note (Signed)
Rechecking BMP. Continue metformin. Recheck 1 month.

## 2015-07-23 NOTE — Patient Instructions (Signed)
Sertraline tablets What is this medicine? SERTRALINE (SER tra leen) is used to treat depression. It may also be used to treat obsessive compulsive disorder, panic disorder, post-trauma stress, premenstrual dysphoric disorder (PMDD) or social anxiety. This medicine may be used for other purposes; ask your health care provider or pharmacist if you have questions. What should I tell my health care provider before I take this medicine? They need to know if you have any of these conditions: -bipolar disorder or a family history of bipolar disorder -diabetes -glaucoma -heart disease -high blood pressure -history of irregular heartbeat -history of low levels of calcium, magnesium, or potassium in the blood -if you often drink alcohol -liver disease -receiving electroconvulsive therapy -seizures -suicidal thoughts, plans, or attempt; a previous suicide attempt by you or a family member -thyroid disease -an unusual or allergic reaction to sertraline, other medicines, foods, dyes, or preservatives -pregnant or trying to get pregnant -breast-feeding How should I use this medicine? Take this medicine by mouth with a glass of water. Follow the directions on the prescription label. You can take it with or without food. Take your medicine at regular intervals. Do not take your medicine more often than directed. Do not stop taking this medicine suddenly except upon the advice of your doctor. Stopping this medicine too quickly may cause serious side effects or your condition may worsen. A special MedGuide will be given to you by the pharmacist with each prescription and refill. Be sure to read this information carefully each time. Talk to your pediatrician regarding the use of this medicine in children. While this drug may be prescribed for children as young as 7 years for selected conditions, precautions do apply. Overdosage: If you think you have taken too much of this medicine contact a poison control  center or emergency room at once. NOTE: This medicine is only for you. Do not share this medicine with others. What if I miss a dose? If you miss a dose, take it as soon as you can. If it is almost time for your next dose, take only that dose. Do not take double or extra doses. What may interact with this medicine? Do not take this medicine with any of the following medications: -certain medicines for fungal infections like fluconazole, itraconazole, ketoconazole, posaconazole, voriconazole -cisapride -disulfiram -dofetilide -linezolid -MAOIs like Carbex, Eldepryl, Marplan, Nardil, and Parnate -metronidazole -methylene blue (injected into a vein) -pimozide -thioridazine -ziprasidone This medicine may also interact with the following medications: -alcohol -aspirin and aspirin-like medicines -certain medicines for depression, anxiety, or psychotic disturbances -certain medicines for irregular heart beat like flecainide, propafenone -certain medicines for migraine headaches like almotriptan, eletriptan, frovatriptan, naratriptan, rizatriptan, sumatriptan, zolmitriptan -certain medicines for sleep -certain medicines for seizures like carbamazepine, valproic acid, phenytoin -certain medicines that treat or prevent blood clots like warfarin, enoxaparin, dalteparin -cimetidine -digoxin -diuretics -fentanyl -furazolidone -isoniazid -lithium -NSAIDs, medicines for pain and inflammation, like ibuprofen or naproxen -other medicines that prolong the QT interval (cause an abnormal heart rhythm) -procarbazine -rasagiline -supplements like St. John's wort, kava kava, valerian -tolbutamide -tramadol -tryptophan This list may not describe all possible interactions. Give your health care provider a list of all the medicines, herbs, non-prescription drugs, or dietary supplements you use. Also tell them if you smoke, drink alcohol, or use illegal drugs. Some items may interact with your  medicine. What should I watch for while using this medicine? Tell your doctor if your symptoms do not get better or if they get worse. Visit your doctor   interactions. Give your health care provider a list of all the medicines, herbs, non-prescription drugs, or dietary supplements you use. Also tell them if you smoke, drink alcohol, or use illegal drugs. Some items may interact with your  medicine.  What should I watch for while using this medicine?  Tell your doctor if your symptoms do not get better or if they get worse. Visit your doctor or health care professional for regular checks on your progress. Because it may take several weeks to see the full effects of this medicine, it is important to continue your treatment as prescribed by your doctor.  Patients and their families should watch out for new or worsening thoughts of suicide or depression. Also watch out for sudden changes in feelings such as feeling anxious, agitated, panicky, irritable, hostile, aggressive, impulsive, severely restless, overly excited and hyperactive, or not being able to sleep. If this happens, especially at the beginning of treatment or after a change in dose, call your health care professional.  You may get drowsy or dizzy. Do not drive, use machinery, or do anything that needs mental alertness until you know how this medicine affects you. Do not stand or sit up quickly, especially if you are an older patient. This reduces the risk of dizzy or fainting spells. Alcohol may interfere with the effect of this medicine. Avoid alcoholic drinks.  Your mouth may get dry. Chewing sugarless gum or sucking hard candy, and drinking plenty of water may help. Contact your doctor if the problem does not go away or is severe.  What side effects may I notice from receiving this medicine?  Side effects that you should report to your doctor or health care professional as soon as possible:  -allergic reactions like skin rash, itching or hives, swelling of the face, lips, or tongue  -black or bloody stools, blood in the urine or vomit  -fast, irregular heartbeat  -feeling faint or lightheaded, falls  -hallucination, loss of contact with reality  -seizures  -suicidal thoughts or other mood changes  -unusual bleeding or bruising  -unusually weak or tired  -vomiting  Side effects that usually do not require medical attention (report to your  doctor or health care professional if they continue or are bothersome):  -change in appetite  -change in sex drive or performance  -diarrhea  -increased sweating  -indigestion, nausea  -tremors  This list may not describe all possible side effects. Call your doctor for medical advice about side effects. You may report side effects to FDA at 1-800-FDA-1088.  Where should I keep my medicine?  Keep out of the reach of children.  Store at room temperature between 15 and 30 degrees C (59 and 86 degrees F). Throw away any unused medicine after the expiration date.  NOTE: This sheet is a summary. It may not cover all possible information. If you have questions about this medicine, talk to your doctor, pharmacist, or health care provider.     © 2016, Elsevier/Gold Standard. (2012-08-23 12:57:35)  Generalized Anxiety Disorder  Generalized anxiety disorder (GAD) is a mental disorder. It interferes with life functions, including relationships, work, and school.  GAD is different from normal anxiety, which everyone experiences at some point in their lives in response to specific life events and activities. Normal anxiety actually helps us prepare for and get through these life events and activities. Normal anxiety goes away after the event or activity is over.   GAD causes anxiety that is not necessarily related to specific   events or activities. It also causes excess anxiety in proportion to specific events or activities. The anxiety associated with GAD is also difficult to control. GAD can vary from mild to severe. People with severe GAD can have intense waves of anxiety with physical symptoms (panic attacks).   SYMPTOMS  The anxiety and worry associated with GAD are difficult to control. This anxiety and worry are related to many life events and activities and also occur more days than not for 6 months or longer. People with GAD also have three or more of the following symptoms (one or more in children):  · Restlessness.     · Fatigue.  · Difficulty concentrating.    · Irritability.  · Muscle tension.  · Difficulty sleeping or unsatisfying sleep.  DIAGNOSIS  GAD is diagnosed through an assessment by your health care provider. Your health care provider will ask you questions about your mood, physical symptoms, and events in your life. Your health care provider may ask you about your medical history and use of alcohol or drugs, including prescription medicines. Your health care provider may also do a physical exam and blood tests. Certain medical conditions and the use of certain substances can cause symptoms similar to those associated with GAD. Your health care provider may refer you to a mental health specialist for further evaluation.  TREATMENT  The following therapies are usually used to treat GAD:   · Medication. Antidepressant medication usually is prescribed for long-term daily control. Antianxiety medicines may be added in severe cases, especially when panic attacks occur.    · Talk therapy (psychotherapy). Certain types of talk therapy can be helpful in treating GAD by providing support, education, and guidance. A form of talk therapy called cognitive behavioral therapy can teach you healthy ways to think about and react to daily life events and activities.  · Stress management techniques. These include yoga, meditation, and exercise and can be very helpful when they are practiced regularly.  A mental health specialist can help determine which treatment is best for you. Some people see improvement with one therapy. However, other people require a combination of therapies.     This information is not intended to replace advice given to you by your health care provider. Make sure you discuss any questions you have with your health care provider.     Document Released: 05/23/2012 Document Revised: 02/16/2014 Document Reviewed: 05/23/2012  Elsevier Interactive Patient Education ©2016 Elsevier Inc.

## 2015-07-24 ENCOUNTER — Encounter: Payer: Self-pay | Admitting: Family Medicine

## 2015-07-24 LAB — BASIC METABOLIC PANEL
BUN / CREAT RATIO: 12 (ref 9–23)
BUN: 10 mg/dL (ref 6–24)
CO2: 22 mmol/L (ref 18–29)
CREATININE: 0.83 mg/dL (ref 0.57–1.00)
Calcium: 9.9 mg/dL (ref 8.7–10.2)
Chloride: 96 mmol/L (ref 96–106)
GFR, EST AFRICAN AMERICAN: 94 mL/min/{1.73_m2} (ref >59)
GFR, EST NON AFRICAN AMERICAN: 81 mL/min/{1.73_m2} (ref >59)
GLUCOSE: 102 mg/dL — AB (ref 65–99)
Potassium: 3.8 mmol/L (ref 3.5–5.2)
Sodium: 137 mmol/L (ref 134–144)

## 2015-08-22 ENCOUNTER — Ambulatory Visit (INDEPENDENT_AMBULATORY_CARE_PROVIDER_SITE_OTHER): Payer: BLUE CROSS/BLUE SHIELD | Admitting: Family Medicine

## 2015-08-22 ENCOUNTER — Encounter: Payer: Self-pay | Admitting: Family Medicine

## 2015-08-22 VITALS — BP 133/85 | HR 76 | Temp 98.2°F | Ht 64.3 in | Wt 191.0 lb

## 2015-08-22 DIAGNOSIS — F40243 Fear of flying: Secondary | ICD-10-CM

## 2015-08-22 DIAGNOSIS — F411 Generalized anxiety disorder: Secondary | ICD-10-CM | POA: Diagnosis not present

## 2015-08-22 DIAGNOSIS — E119 Type 2 diabetes mellitus without complications: Secondary | ICD-10-CM

## 2015-08-22 MED ORDER — METFORMIN HCL ER 500 MG PO TB24: 500.0000 mg | ORAL_TABLET | Freq: Every day | ORAL | Status: DC

## 2015-08-22 MED ORDER — SERTRALINE HCL 100 MG PO TABS: 100.0000 mg | ORAL_TABLET | Freq: Every day | ORAL | Status: DC

## 2015-08-22 MED ORDER — ALPRAZOLAM 0.5 MG PO TABS: ORAL_TABLET | ORAL | Status: DC

## 2015-08-22 NOTE — Assessment & Plan Note (Signed)
Doing better, but still not great. Will increase zoloft to 100mg  daily and recheck in 1 month.

## 2015-08-22 NOTE — Progress Notes (Signed)
BP 133/85 mmHg  Pulse 76  Temp(Src) 98.2 F (36.8 C)  Ht 5' 4.3" (1.633 m)  Wt 191 lb (86.637 kg)  BMI 32.49 kg/m2  SpO2 98%  LMP 09/01/2014 (Approximate)   Subjective:    Patient ID: Tabitha Evans, female    DOB: [birth year], 55 y.o.   MRN: [id number]  HPI: Tabitha Evans is a 55 y.o. female  Chief Complaint  Patient presents with  . Depression  . Anxiety  . med refills    needs metformin   DEPRESSION- doing a bit better, but still not great Mood status: better Satisfied with current treatment?: no Symptom severity: moderate  Duration of current treatment : months Side effects: no Medication compliance: excellent compliance Psychotherapy/counseling: no  Depressed mood: yes Anxious mood: yes Anhedonia: no Significant weight loss or gain: yes Insomnia: yes hard to stay asleep Fatigue: yes Feelings of worthlessness or guilt: yes Impaired concentration/indecisiveness: yes Suicidal ideations: no Hopelessness: yes Crying spells: yes Depression screen [facility] 2/9 08/22/2015 06/18/2015  Decreased Interest 1 0  Down, Depressed, Hopeless 0 0  PHQ - 2 Score 1 0  Altered sleeping 3 -  Tired, decreased energy 1 -  Change in appetite 1 -  Feeling bad or failure about yourself  0 -  Trouble concentrating 2 -  Moving slowly or fidgety/restless 0 -  Suicidal thoughts 0 -  PHQ-9 Score 8 -  Difficult doing work/chores Somewhat difficult -   GAD 7 : Generalized Anxiety Score 08/22/2015  Nervous, Anxious, on Edge 1  Control/stop worrying 3  Worry too much - different things 3  Trouble relaxing 1  Restless 0  Easily annoyed or irritable 0  Afraid - awful might happen 0  Total GAD 7 Score 8  Anxiety Difficulty Not difficult at all   Flying to [place] in a couple of weeks and anxious about flying.   Relevant past medical, surgical, family and social history reviewed and updated as indicated. Interim medical history since our last visit reviewed. Allergies and medications  reviewed and updated.  Review of Systems  Constitutional: Negative.   Respiratory: Negative.   Cardiovascular: Negative.   Psychiatric/Behavioral: Positive for sleep disturbance and dysphoric mood. Negative for suicidal ideas, hallucinations, behavioral problems, confusion, self-injury, decreased concentration and agitation. The patient is nervous/anxious. The patient is not hyperactive.    Per HPI unless specifically indicated above     Objective:    BP 133/85 mmHg  Pulse 76  Temp(Src) 98.2 F (36.8 C)  Ht 5' 4.3" (1.633 m)  Wt 191 lb (86.637 kg)  BMI 32.49 kg/m2  SpO2 98%  LMP 09/01/2014 (Approximate)  Wt Readings from Last 3 Encounters:  08/22/15 191 lb (86.637 kg)  07/23/15 198 lb (89.812 kg)  06/18/15 197 lb (89.359 kg)    Physical Exam  Constitutional: She is oriented to person, place, and time. She appears well-developed and well-nourished. No distress.  HENT:  Head: Normocephalic and atraumatic.  Right Ear: Hearing normal.  Left Ear: Hearing normal.  Nose: Nose normal.  Eyes: Conjunctivae and lids are normal. Right eye exhibits no discharge. Left eye exhibits no discharge. No scleral icterus.  Cardiovascular: Normal rate, regular rhythm, normal heart sounds and intact distal pulses.  Exam reveals no gallop and no friction rub.   No murmur heard. Pulmonary/Chest: Effort normal and breath sounds normal. No respiratory distress. She has no wheezes. She has no rales. She exhibits no tenderness.  Musculoskeletal: Normal range of motion.  Neurological: She is alert  and oriented to person, place, and time.  Skin: Skin is warm, dry and intact. No rash noted. She is not diaphoretic. No erythema. No pallor.  Psychiatric: She has a normal mood and affect. Her speech is normal and behavior is normal. Judgment and thought content normal. Cognition and memory are normal.  Nursing note and vitals reviewed.   Results for orders placed or performed in visit on [id number]  Basic  metabolic panel  Result Value Ref Range   Glucose 102 (H) 65 - 99 mg/dL   BUN 10 6 - 24 mg/dL   Creatinine, Ser 0.83 0.57 - 1.00 mg/dL   GFR calc non Af Amer 81 >59 mL/min/1.73   GFR calc Af Amer 94 >59 mL/min/1.73   BUN/Creatinine Ratio 12 9 - 23   Sodium 137 134 - 144 mmol/L   Potassium 3.8 3.5 - 5.2 mmol/L   Chloride 96 96 - 106 mmol/L   CO2 22 18 - 29 mmol/L   Calcium 9.9 8.7 - 10.2 mg/dL      Assessment & Plan:   Problem List Items Addressed This Visit      Endocrine   Type 2 diabetes mellitus without complication ([place])    Tolerating well. Due for A1c next month. Needs refill- given today.      Relevant Medications   metFORMIN (GLUCOPHAGE XR) 500 MG 24 hr tablet     Other   Anxiety disorder - Primary    Doing better, but still not great. Will increase zoloft to 100mg  daily and recheck in 1 month.        Other Visit Diagnoses    Fear of flying        Xanax given for flying. Not to drive on medicine. Not to drink on medicine.     Relevant Medications    sertraline (ZOLOFT) 100 MG tablet    ALPRAZolam (XANAX) 0.5 MG tablet        Follow up plan: Return in about 4 weeks (around 09/19/2015).

## 2015-08-22 NOTE — Assessment & Plan Note (Signed)
Tolerating well. Due for A1c next month. Needs refill- given today.

## 2015-10-01 ENCOUNTER — Ambulatory Visit: Payer: BLUE CROSS/BLUE SHIELD | Admitting: Family Medicine

## 2015-10-08 ENCOUNTER — Encounter: Payer: Self-pay | Admitting: Family Medicine

## 2015-10-08 ENCOUNTER — Ambulatory Visit (INDEPENDENT_AMBULATORY_CARE_PROVIDER_SITE_OTHER): Payer: BLUE CROSS/BLUE SHIELD | Admitting: Family Medicine

## 2015-10-08 VITALS — BP 138/87 | HR 83 | Temp 98.3°F | Wt 181.0 lb

## 2015-10-08 DIAGNOSIS — F411 Generalized anxiety disorder: Secondary | ICD-10-CM

## 2015-10-08 MED ORDER — SERTRALINE HCL 100 MG PO TABS: 100.0000 mg | ORAL_TABLET | Freq: Every day | ORAL | 1 refills | Status: DC

## 2015-10-08 NOTE — Assessment & Plan Note (Signed)
Doing much better. Feeling stable. Does not want to change medicine. Continue current regimen and recheck in 1 month at DM visit.

## 2015-10-08 NOTE — Progress Notes (Signed)
BP 138/87 (BP Location: Left Arm, Patient Position: Sitting, Cuff Size: Large)   Pulse 83   Temp 98.3 F (36.8 C)   Wt 181 lb (82.1 kg)   LMP 09/01/2014 (Approximate)   SpO2 98%   BMI 30.78 kg/m    Subjective:    Patient ID: Tabitha Evans, female    DOB: [birth year], 55 y.o.   MRN: [id number]  HPI: Tabitha Evans is a 55 y.o. female  Chief Complaint  Patient presents with  . Depression   DEPRESSION- feeling good! Doesn't want to change dose. Happy with how she is doing.  Mood status: better Satisfied with current treatment?: yes Symptom severity: mild  Duration of current treatment : months Side effects: no Medication compliance: excellent compliance Psychotherapy/counseling: no  Depressed mood: yes Anxious mood: no Anhedonia: no Significant weight loss or gain: yes Insomnia: no  Fatigue: yes Feelings of worthlessness or guilt: no Impaired concentration/indecisiveness: no Suicidal ideations: no Hopelessness: no Crying spells: no Depression screen [facility] 2/9 10/08/2015 08/22/2015 06/18/2015  Decreased Interest 0 1 0  Down, Depressed, Hopeless 0 0 0  PHQ - 2 Score 0 1 0  Altered sleeping 3 3 -  Tired, decreased energy 1 1 -  Change in appetite 2 1 -  Feeling bad or failure about yourself  0 0 -  Trouble concentrating 3 2 -  Moving slowly or fidgety/restless 0 0 -  Suicidal thoughts 0 0 -  PHQ-9 Score 9 8 -  Difficult doing work/chores - Somewhat difficult -    Relevant past medical, surgical, family and social history reviewed and updated as indicated. Interim medical history since our last visit reviewed. Allergies and medications reviewed and updated.  Review of Systems  Constitutional: Negative.   Respiratory: Negative.   Cardiovascular: Negative.   Psychiatric/Behavioral: Negative.     Per HPI unless specifically indicated above     Objective:    BP 138/87 (BP Location: Left Arm, Patient Position: Sitting, Cuff Size: Large)   Pulse 83   Temp  98.3 F (36.8 C)   Wt 181 lb (82.1 kg)   LMP 09/01/2014 (Approximate)   SpO2 98%   BMI 30.78 kg/m   Wt Readings from Last 3 Encounters:  10/08/15 181 lb (82.1 kg)  08/22/15 191 lb (86.6 kg)  07/23/15 198 lb (89.8 kg)    Physical Exam  Constitutional: She is oriented to person, place, and time. She appears well-developed and well-nourished. No distress.  HENT:  Head: Normocephalic and atraumatic.  Right Ear: Hearing normal.  Left Ear: Hearing normal.  Nose: Nose normal.  Eyes: Conjunctivae and lids are normal. Right eye exhibits no discharge. Left eye exhibits no discharge. No scleral icterus.  Pulmonary/Chest: Effort normal. No respiratory distress.  Musculoskeletal: Normal range of motion.  Neurological: She is alert and oriented to person, place, and time.  Skin: Skin is warm, dry and intact. No rash noted. No erythema. No pallor.  Psychiatric: She has a normal mood and affect. Her speech is normal and behavior is normal. Judgment and thought content normal. Cognition and memory are normal.  Nursing note and vitals reviewed.   Results for orders placed or performed in visit on [id number]  Basic metabolic panel  Result Value Ref Range   Glucose 102 (H) 65 - 99 mg/dL   BUN 10 6 - 24 mg/dL   Creatinine, Ser 0.83 0.57 - 1.00 mg/dL   GFR calc non Af Amer 81 >59 mL/min/1.73   GFR calc Af Wyvonnia Lora  94 >59 mL/min/1.73   BUN/Creatinine Ratio 12 9 - 23   Sodium 137 134 - 144 mmol/L   Potassium 3.8 3.5 - 5.2 mmol/L   Chloride 96 96 - 106 mmol/L   CO2 22 18 - 29 mmol/L   Calcium 9.9 8.7 - 10.2 mg/dL      Assessment & Plan:   Problem List Items Addressed This Visit      Other   Anxiety disorder - Primary    Doing much better. Feeling stable. Does not want to change medicine. Continue current regimen and recheck in 1 month at DM visit.        Other Visit Diagnoses   None.      Follow up plan: Return 3-5 weeks, for DM visit.

## 2015-11-05 ENCOUNTER — Ambulatory Visit: Payer: BLUE CROSS/BLUE SHIELD | Admitting: Family Medicine

## 2016-03-01 ENCOUNTER — Other Ambulatory Visit: Payer: Self-pay | Admitting: Family Medicine

## 2016-03-23 ENCOUNTER — Encounter: Payer: Self-pay | Admitting: Family Medicine

## 2016-03-23 MED ORDER — OMEPRAZOLE 20 MG PO CPDR: 20.0000 mg | DELAYED_RELEASE_CAPSULE | Freq: Every day | ORAL | 1 refills | Status: DC

## 2016-05-29 ENCOUNTER — Encounter: Payer: Self-pay | Admitting: Family Medicine

## 2016-05-29 ENCOUNTER — Ambulatory Visit (INDEPENDENT_AMBULATORY_CARE_PROVIDER_SITE_OTHER): Payer: BLUE CROSS/BLUE SHIELD | Admitting: Family Medicine

## 2016-05-29 VITALS — BP 125/78 | HR 80 | Temp 98.3°F | Ht 64.0 in | Wt 195.7 lb

## 2016-05-29 DIAGNOSIS — E119 Type 2 diabetes mellitus without complications: Secondary | ICD-10-CM

## 2016-05-29 DIAGNOSIS — J069 Acute upper respiratory infection, unspecified: Secondary | ICD-10-CM | POA: Diagnosis not present

## 2016-05-29 MED ORDER — HYDROCOD POLST-CPM POLST ER 10-8 MG/5ML PO SUER: 5.0000 mL | Freq: Two times a day (BID) | ORAL | 0 refills | Status: DC | PRN

## 2016-05-29 NOTE — Assessment & Plan Note (Signed)
Overdue for A1C. Last A1C was 06/2015 and was 7.8. Continue metformin and await results. Continue working on lifestyle modifications

## 2016-05-29 NOTE — Patient Instructions (Signed)
Follow up as needed

## 2016-05-29 NOTE — Progress Notes (Signed)
BP 125/78 (BP Location: Right Arm, Patient Position: Sitting, Cuff Size: Normal)   Pulse 80   Temp 98.3 F (36.8 C)   Ht 5\' 4"  (1.626 m)   Wt 195 lb 11.2 oz (88.8 kg)   LMP 09/01/2014 (Approximate)   SpO2 94%   BMI 33.59 kg/m    Subjective:    Patient ID: Tabitha Evans, female    DOB: 03-Mar-1960, 56 y.o.   MRN: [id number]  HPI: Tabitha Evans is a 56 y.o. female  Presenting with cough, chest congestion, ear discomfort, and malaise x 9 days. Has also had 3 episodes of vomiting and diarrhea 3x/day x 3 days. Went to urgent care 2 days ago and was Rxed ipratropium nasal spray, tessalon, and cefdinir with instructions to fill the abx if the other meds did not give relief. She wants advice on whether or not to take the cefdinir. She has used the nasal spray and tessalon along with other OTC meds including delsym, mucinex, and robitussin with minimal relief. Denies fever, sinus pain, and wheezing.   Chief Complaint  Patient presents with  . Sore Throat    x's one week. Went to Urgent Care. Not feeling better. Has Rx for Cefdinir on hand.   . Cough    Productive, clear  . Ear Pain    Left ear   Past Medical History:  Diagnosis Date  . Acid reflux   . Hypertension    Social History   Social History  . Marital status: Single    Spouse name: N/A  . Number of children: N/A  . Years of education: N/A   Occupational History  . Not on file.   Social History Main Topics  . Smoking status: Never Smoker  . Smokeless tobacco: Never Used  . Alcohol use 0.0 oz/week     Comment: on the weekends  . Drug use: No  . Sexual activity: No   Other Topics Concern  . Not on file   Social History Narrative  . No narrative on file  Non-smoker, no relevant ENT pmhx.   Relevant past medical, surgical, family and social history reviewed and updated as indicated. Interim medical history since our last visit reviewed. Allergies and medications reviewed and updated.  Review of Systems    Constitutional: Positive for fatigue. Negative for fever.  HENT: Positive for ear pain (left) and sore throat. Negative for congestion, ear discharge, sinus pain and sinus pressure.   Eyes: Negative.   Respiratory: Positive for cough. Negative for shortness of breath and wheezing.   Cardiovascular: Negative.   Gastrointestinal: Positive for diarrhea and vomiting.  Endocrine: Negative.   Genitourinary: Negative.   Musculoskeletal: Positive for myalgias.  Skin: Negative.   Allergic/Immunologic: Negative.   Neurological: Negative.   Hematological: Negative.   Psychiatric/Behavioral: Negative.     Per HPI unless specifically indicated above     Objective:    BP 125/78 (BP Location: Right Arm, Patient Position: Sitting, Cuff Size: Normal)   Pulse 80   Temp 98.3 F (36.8 C)   Ht 5\' 4"  (1.626 m)   Wt 195 lb 11.2 oz (88.8 kg)   LMP 09/01/2014 (Approximate)   SpO2 94%   BMI 33.59 kg/m   Wt Readings from Last 3 Encounters:  05/29/16 195 lb 11.2 oz (88.8 kg)  10/08/15 181 lb (82.1 kg)  08/22/15 191 lb (86.6 kg)    Physical Exam  Constitutional: She is oriented to person, place, and time. She appears well-developed and well-nourished.  No distress.  HENT:  Head: Normocephalic and atraumatic.  Right Ear: Tympanic membrane and external ear normal.  Left Ear: Tympanic membrane and external ear normal.  Nose: Nose normal.  Mouth/Throat: Mucous membranes are normal. No oropharyngeal exudate.  oropharynx is erythematous, non-edematous, tonsils are not enlarged   Eyes: Conjunctivae are normal. Right eye exhibits no discharge. Left eye exhibits no discharge. No scleral icterus.  Neck: Neck supple.  Cardiovascular: Normal rate, regular rhythm and normal heart sounds.   Pulmonary/Chest: Effort normal and breath sounds normal. No respiratory distress. She has no wheezes. She has no rales. She exhibits no tenderness.  Abdominal: Soft. There is no tenderness.  Lymphadenopathy:    She has  no cervical adenopathy.  Neurological: She is alert and oriented to person, place, and time.  Skin: Skin is warm and dry. She is not diaphoretic.  Psychiatric: She has a normal mood and affect.      Assessment & Plan:   Problem List Items Addressed This Visit      Endocrine   Type 2 diabetes mellitus without complication ([place])    Overdue for A1C. Last A1C was 06/2015 and was 7.8. Continue metformin and await results. Continue working on lifestyle modifications      Relevant Orders   HgB A1c    Other Visit Diagnoses    Upper respiratory tract infection, unspecified type    -  Primary   Rapid strep neg, discussed filling the cefdinir and starting Tussionex for bedtime in addition to tessalon and delsym for daytime. Mucinex, rest, hydration.    Relevant Orders   Rapid strep screen (not at [facility]) (Completed)   Culture, Group A Strep (Completed)       Follow up plan: Return in about 3 months (around 08/28/2016) for A1C.

## 2016-06-01 ENCOUNTER — Other Ambulatory Visit: Payer: Self-pay | Admitting: Family Medicine

## 2016-06-01 LAB — RAPID STREP SCREEN (MED CTR MEBANE ONLY): Strep Gp A Ag, IA W/Reflex: NEGATIVE

## 2016-06-01 LAB — HEMOGLOBIN A1C
Est. average glucose Bld gHb Est-mCnc: 137 mg/dL
HEMOGLOBIN A1C: 6.4 % — AB (ref 4.8–5.6)

## 2016-06-01 LAB — CULTURE, GROUP A STREP: Strep A Culture: NEGATIVE

## 2016-07-02 ENCOUNTER — Emergency Department
Admission: EM | Admit: 2016-07-02 | Discharge: 2016-07-02 | Disposition: A | Discharge status: Home / Self Care | Payer: BLUE CROSS/BLUE SHIELD | Attending: Emergency Medicine | Admitting: Emergency Medicine

## 2016-07-02 ENCOUNTER — Emergency Department: Payer: BLUE CROSS/BLUE SHIELD

## 2016-07-02 ENCOUNTER — Telehealth: Payer: Self-pay | Admitting: Family Medicine

## 2016-07-02 ENCOUNTER — Encounter: Payer: Self-pay | Admitting: Emergency Medicine

## 2016-07-02 DIAGNOSIS — I10 Essential (primary) hypertension: Secondary | ICD-10-CM | POA: Insufficient documentation

## 2016-07-02 DIAGNOSIS — Z79899 Other long term (current) drug therapy: Secondary | ICD-10-CM | POA: Diagnosis not present

## 2016-07-02 DIAGNOSIS — Z7984 Long term (current) use of oral hypoglycemic drugs: Secondary | ICD-10-CM | POA: Diagnosis not present

## 2016-07-02 DIAGNOSIS — R079 Chest pain, unspecified: Secondary | ICD-10-CM | POA: Insufficient documentation

## 2016-07-02 DIAGNOSIS — Z7982 Long term (current) use of aspirin: Secondary | ICD-10-CM | POA: Insufficient documentation

## 2016-07-02 DIAGNOSIS — E119 Type 2 diabetes mellitus without complications: Secondary | ICD-10-CM | POA: Insufficient documentation

## 2016-07-02 LAB — TROPONIN I
Troponin I: <0.03 ng/mL (ref <0.03)
Troponin I: <0.03 ng/mL (ref <0.03)

## 2016-07-02 LAB — BASIC METABOLIC PANEL
Anion gap: 9 (ref 5–15)
BUN: 15 mg/dL (ref 6–20)
CO2: 27 mmol/L (ref 22–32)
Calcium: 9.8 mg/dL (ref 8.9–10.3)
Chloride: 103 mmol/L (ref 101–111)
Creatinine, Ser: 0.66 mg/dL (ref 0.44–1.00)
GFR calc non Af Amer: >60 mL/min (ref >60)
Glucose, Bld: 114 mg/dL — ABNORMAL HIGH (ref 65–99)
POTASSIUM: 3.4 mmol/L — AB (ref 3.5–5.1)
SODIUM: 139 mmol/L (ref 135–145)

## 2016-07-02 LAB — CBC
HEMATOCRIT: 39.6 % (ref 35.0–47.0)
Hemoglobin: 13.7 g/dL (ref 12.0–16.0)
MCH: 28.1 pg (ref 26.0–34.0)
MCHC: 34.5 g/dL (ref 32.0–36.0)
MCV: 81.6 fL (ref 80.0–100.0)
PLATELETS: 306 10*3/uL (ref 150–440)
RBC: 4.86 MIL/uL (ref 3.80–5.20)
RDW: 13.7 % (ref 11.5–14.5)
WBC: 6.4 10*3/uL (ref 3.6–11.0)

## 2016-07-02 NOTE — Telephone Encounter (Signed)
Patient at ER  

## 2016-07-02 NOTE — Telephone Encounter (Addendum)
Patient called to see if she could get an appointment early this morning with Dr. Wynetta Emery.Went through the schedule and offered times for patient but patient stated she would not be able to make times given.Patient informed this Probation officer that she is experiencing  tingling in her left arm, tightness in her chest, and something moving around in her head. . Also informed patient she may need to go to the ER. Patient  stated she will go to the ER because she would like to be seen now and wants to know for sure if she needs immediate attention.   Thank you.

## 2016-07-02 NOTE — Discharge Instructions (Signed)
Please return here for worse pain shortness of breath fever or feeling sicker. Dr. Humphrey Rolls can see you tomorrow at 10:00. He will continue to check on your chest pain.

## 2016-07-02 NOTE — ED Notes (Signed)
Report given to Stephen, RN 

## 2016-07-02 NOTE — ED Provider Notes (Signed)
[facility] Emergency Department Provider Note   ____________________________________________   First MD Initiated Contact with Patient 07/02/16 [phone]     (approximate)  I have reviewed the triage vital signs and the nursing notes.   HISTORY  Chief Complaint Chest Pain    HPI Tabitha Evans is a 56 y.o. female who woke up this morning and reports she had some chest pressure and heaviness and some left arm tingling. It lasted for approximately an hour. His not associated with this shortness of breath sweating or nausea. Nothing she seemed to do made it better or worse although she really didn't exercise much. Pain is gone now. And station is normal in both arms at this time. The pressure was mild when it was present. Patient also reports she feels some tingling in her scalp occasionally. There is none at this at this time.  Family history significant for her brother dying of a heart attack at age 68 although he had retention and was on dialysis for number of years prior to that. Past Medical History:  Diagnosis Date  . Acid reflux   . Hypertension     Patient Active Problem List   Diagnosis Date Noted  . Anxiety disorder 07/23/2015  . Type 2 diabetes mellitus without complication ([place]) 15/17/6160  . Essential hypertension 09/18/2014  . Iron deficiency anemia 09/18/2014  . Candidal intertrigo 09/18/2014  . Breast mass 06/30/2012    Past Surgical History:  Procedure Laterality Date  . COLONOSCOPY    . UPPER GASTROINTESTINAL ENDOSCOPY      Prior to Admission medications   Medication Sig Start Date End Date Taking? Authorizing Provider  aspirin 81 MG tablet Take 81 mg by mouth daily.   Yes [provider]  ipratropium (ATROVENT) 0.03 % nasal spray Place 1 spray into both nostrils 2 (two) times daily.  05/26/16  Yes [provider]  metFORMIN (GLUCOPHAGE-XR) 500 MG 24 hr tablet TAKE ONE TABLET BY MOUTH ONCE DAILY WITH BREAKFAST  03/02/16  Yes Johnson, Megan P, DO  omeprazole (PRILOSEC) 20 MG capsule Take 1 capsule (20 mg total) by mouth daily. 03/23/16  Yes Johnson, Megan P, DO  sertraline (ZOLOFT) 100 MG tablet Take 1 tablet (100 mg total) by mouth daily. 10/08/15  Yes Johnson, Megan P, DO  triamterene-hydrochlorothiazide (MAXZIDE-25) 37.5-25 MG tablet TAKE ONE TABLET BY MOUTH ONCE DAILY 06/02/16  Yes Johnson, Megan P, DO  chlorpheniramine-HYDROcodone (TUSSIONEX PENNKINETIC ER) 10-8 MG/5ML SUER Take 5 mLs by mouth every 12 (twelve) hours as needed for cough. Patient not taking: Reported on 07/02/2016 05/29/16   Volney American, PA-C    Allergies Patient has no known allergies.  Family History  Problem Relation Age of Onset  . Hypertension Mother   . Hyperlipidemia Mother   . Glaucoma Mother   . Liver disease Father   . Cancer Father   . Glaucoma Sister   . Hypertension Brother   . Dementia Maternal Grandmother   . Glaucoma Sister     Social History Social History  Substance Use Topics  . Smoking status: Never Smoker  . Smokeless tobacco: Never Used  . Alcohol use 0.0 oz/week     Comment: on the weekends    Review of Systems  Constitutional: No fever/chills Eyes: No visual changes. ENT: No sore throat. Cardiovascular: See history of present illness Respiratory: Denies shortness of breath. Gastrointestinal: No abdominal pain.  No nausea, no vomiting.  No diarrhea.  No constipation. Genitourinary: Negative for dysuria. Musculoskeletal:  Negative for back pain. Skin: Negative for rash. Neurological: At present Negative for headaches, focal weakness or numbness. {  ____________________________________________   PHYSICAL EXAM:  VITAL SIGNS: ED Triage Vitals  Enc Vitals Group     BP 07/02/16 0919 (!) 151/89     Pulse Rate 07/02/16 0919 79     Resp 07/02/16 0919 18     Temp 07/02/16 0919 98.6 F (37 C)     Temp Source 07/02/16 0919 Oral     SpO2 07/02/16 0919 96 %     Weight --       Height --      Head Circumference --      Peak Flow --      Pain Score 07/02/16 0915 5     Pain Loc --      Pain Edu? --      Excl. in [place]? --     Constitutional: Alert and oriented. Well appearing and in no acute distress. Eyes: Conjunctivae are normal. PERRL. EOMI. Head: Atraumatic. Nose: No congestion/rhinnorhea. Mouth/Throat: Mucous membranes are moist.  Oropharynx non-erythematous. Neck: No stridor.  Cardiovascular: Normal rate, regular rhythm. Grossly normal heart sounds.  Good peripheral circulation. Respiratory: Normal respiratory effort.  No retractions. Lungs CTAB. Gastrointestinal: Soft and nontender. No distention. No abdominal bruits. No CVA tenderness. Musculoskeletal: No lower extremity tenderness nor edema.  No joint effusions. Neurologic:  Normal speech and language. No gross focal neurologic deficits are appreciated. No gait instability. Skin:  Skin is warm, dry and intact. No rash noted. Psychiatric: Mood and affect are normal. Speech and behavior are normal.  ____________________________________________   LABS (all labs ordered are listed, but only abnormal results are displayed)  Labs Reviewed  BASIC METABOLIC PANEL - Abnormal; Notable for the following:       Result Value   Potassium 3.4 (*)    Glucose, Bld 114 (*)    All other components within normal limits  CBC  TROPONIN I  TROPONIN I   ____________________________________________  EKG  EKG read and interpreted by me shows normal sinus rhythm rate of 78 left axis no acute changes ____________________________________________  RADIOLOGY  Study Result   CLINICAL DATA:  Left-sided chest pain impression  EXAM: CHEST  2 VIEW  COMPARISON:  12/15/2004  FINDINGS: The heart size and mediastinal contours are within normal limits. Both lungs are clear. The visualized skeletal structures are unremarkable.  IMPRESSION: No active cardiopulmonary disease.   Electronically Signed   By:  Inez Catalina M.D.   On: 07/02/2016 09:34    ____________________________________________   PROCEDURES  Procedure(s) performed:   Procedures  Critical Care performed:  ____________________________________________   INITIAL IMPRESSION / ASSESSMENT AND PLAN / ED COURSE  Pertinent labs & imaging results that were available during my care of the patient were reviewed by me and considered in my medical decision making (see chart for details).          ____________________________________________   FINAL CLINICAL IMPRESSION(S) / ED DIAGNOSES  Final diagnoses:  Nonspecific chest pain      NEW MEDICATIONS STARTED DURING THIS VISIT:  New Prescriptions   No medications on file     Note:  This document was prepared using Dragon voice recognition software and may include unintentional dictation errors.    Nena Polio, MD 07/02/16 1311

## 2016-07-02 NOTE — ED Triage Notes (Signed)
Pt c/o left sided chest pressure with left arm tingling on and off for a month. Has not had any other symptoms with it.  Ambulatory to check in desk. NAD.

## 2016-09-01 ENCOUNTER — Ambulatory Visit (INDEPENDENT_AMBULATORY_CARE_PROVIDER_SITE_OTHER): Payer: BLUE CROSS/BLUE SHIELD | Admitting: Family Medicine

## 2016-09-01 ENCOUNTER — Encounter: Payer: Self-pay | Admitting: Family Medicine

## 2016-09-01 VITALS — BP 117/79 | HR 78 | Temp 98.3°F | Ht 64.0 in | Wt 188.3 lb

## 2016-09-01 DIAGNOSIS — G2581 Restless legs syndrome: Secondary | ICD-10-CM

## 2016-09-01 DIAGNOSIS — I10 Essential (primary) hypertension: Secondary | ICD-10-CM

## 2016-09-01 DIAGNOSIS — E119 Type 2 diabetes mellitus without complications: Secondary | ICD-10-CM | POA: Diagnosis not present

## 2016-09-01 DIAGNOSIS — D509 Iron deficiency anemia, unspecified: Secondary | ICD-10-CM | POA: Diagnosis not present

## 2016-09-01 DIAGNOSIS — F411 Generalized anxiety disorder: Secondary | ICD-10-CM | POA: Diagnosis not present

## 2016-09-01 LAB — MICROALBUMIN, URINE: Microalb, Ur: 80

## 2016-09-01 LAB — HEMOGLOBIN A1C: Hemoglobin A1C: 6.1

## 2016-09-01 MED ORDER — TRIAMTERENE-HCTZ 37.5-25 MG PO TABS: 1.0000 | ORAL_TABLET | Freq: Every day | ORAL | 1 refills | Status: DC

## 2016-09-01 MED ORDER — SERTRALINE HCL 100 MG PO TABS: 100.0000 mg | ORAL_TABLET | Freq: Every day | ORAL | 1 refills | Status: DC

## 2016-09-01 MED ORDER — OMEPRAZOLE 20 MG PO CPDR: 20.0000 mg | DELAYED_RELEASE_CAPSULE | Freq: Every day | ORAL | 1 refills | Status: DC

## 2016-09-01 NOTE — Assessment & Plan Note (Signed)
Under good control. Continue current regimen. Refill given. Call with any concerns.  

## 2016-09-01 NOTE — Assessment & Plan Note (Signed)
Rechecking levels today- possibly the cause of her RLS, await results and treat as needed.

## 2016-09-01 NOTE — Assessment & Plan Note (Signed)
Under good control with A1c of 6.1. Continue current regimen. Refill given. Call with any concerns.

## 2016-09-01 NOTE — Progress Notes (Signed)
BP 117/79 (BP Location: Left Arm, Patient Position: Sitting, Cuff Size: Normal)   Pulse 78   Temp 98.3 F (36.8 C)   Ht 5\' 4"  (1.626 m)   Wt 188 lb 4.8 oz (85.4 kg)   LMP 09/01/2014 (Approximate)   SpO2 99%   BMI 32.32 kg/m    Subjective:    Patient ID: Tabitha Evans, female    DOB: [birth year], 56 y.o.   MRN: [id number]  HPI: [name] is a 56 y.o. female  Chief Complaint  Patient presents with  . Diabetes   HYPERTENSION Hypertension status: controlled  Satisfied with current treatment? yes Duration of hypertension: chronic BP monitoring frequency:  not checking BP medication side effects:  no Medication compliance: excellent compliance Previous BP meds: triamterene-HCTZ Aspirin: no Recurrent headaches: yes Visual changes: no Palpitations: no Dyspnea: no Chest pain: no Lower extremity edema: no Dizzy/lightheaded: no  DIABETES Hypoglycemic episodes:yes Polydipsia/polyuria: no Visual disturbance: no Chest pain: no Paresthesias: no Glucose Monitoring: no Taking Insulin?: no Blood Pressure Monitoring: not checking Retinal Examination: Not up to Date Foot Exam: Up to Date Diabetic Education: Completed Pneumovax: Up to Date Influenza: Up to Date Aspirin: no  ANXIETY/STRESS Duration:controlled Anxious mood: no  Excessive worrying: no Irritability: no  Sweating: no Nausea: no Palpitations:no Hyperventilation: no Panic attacks: no Agoraphobia: no  Obscessions/compulsions: no Depressed mood: no Depression screen [facility] 2/9 10/08/2015 08/22/2015 06/18/2015  Decreased Interest 0 1 0  Down, Depressed, Hopeless 0 0 0  PHQ - 2 Score 0 1 0  Altered sleeping 3 3 -  Tired, decreased energy 1 1 -  Change in appetite 2 1 -  Feeling bad or failure about yourself  0 0 -  Trouble concentrating 3 2 -  Moving slowly or fidgety/restless 0 0 -  Suicidal thoughts 0 0 -  PHQ-9 Score 9 8 -  Difficult doing work/chores - Somewhat difficult -   Anhedonia:  no Weight changes: no Insomnia: no   Hypersomnia: no Fatigue/loss of energy: no Feelings of worthlessness: no Feelings of guilt: no Impaired concentration/indecisiveness: no Suicidal ideations: no  Crying spells: no Recent Stressors/Life Changes: no  RESTLESS LEGS Duration: 5 months Discomfort description:  Jumping leg- she can stop the shaking leg when she thinks about it, but when she doesn't think about it she does it Pain: no Location: R leg Bilateral: yes Symmetric: no Severity: moderate Onset:  gradual Frequency:  every few minutes Symptoms only occur while legs at rest: yes Sudden unintentional leg jerking: yes Bed partner bothered by leg movements: yes LE numbness: no Decreased sensation: no Weakness: no Insomnia: no Daytime somnolence: no Fatigue: no Alleviating factors: thinking about it Aggravating factors: unknown Status: stable Treatments attempted: nothing  ANEMIA Anemia status: uncontrolled Etiology of anemia: iron-deficiency Duration of anemia treatment: has been off the iron for about a year Compliance with treatment: good compliance Iron supplementation side effects: no Severity of anemia: mild Fatigue: no Decreased exercise tolerance: no  Dyspnea on exertion: no Palpitations: no Bleeding: no Pica: no  Relevant past medical, surgical, family and social history reviewed and updated as indicated. Interim medical history since our last visit reviewed. Allergies and medications reviewed and updated.  Review of Systems  Constitutional: Negative.   HENT: Negative.   Respiratory: Negative.   Cardiovascular: Negative.   Psychiatric/Behavioral: Negative.     Per HPI unless specifically indicated above     Objective:    BP 117/79 (BP Location: Left Arm, Patient Position: Sitting, Cuff Size:  Normal)   Pulse 78   Temp 98.3 F (36.8 C)   Ht 5\' 4"  (1.626 m)   Wt 188 lb 4.8 oz (85.4 kg)   LMP 09/01/2014 (Approximate)   SpO2 99%   BMI 32.32  kg/m   Wt Readings from Last 3 Encounters:  09/01/16 188 lb 4.8 oz (85.4 kg)  05/29/16 195 lb 11.2 oz (88.8 kg)  10/08/15 181 lb (82.1 kg)    Physical Exam  Constitutional: She is oriented to person, place, and time. She appears well-developed and well-nourished. No distress.  HENT:  Head: Normocephalic and atraumatic.  Right Ear: Hearing normal.  Left Ear: Hearing normal.  Nose: Nose normal.  Eyes: Conjunctivae and lids are normal. Right eye exhibits no discharge. Left eye exhibits no discharge. No scleral icterus.  Cardiovascular: Normal rate, regular rhythm, normal heart sounds and intact distal pulses.  Exam reveals no gallop and no friction rub.   No murmur heard. Pulmonary/Chest: Effort normal and breath sounds normal. No respiratory distress. She has no wheezes. She has no rales. She exhibits no tenderness.  Musculoskeletal: Normal range of motion.  Neurological: She is alert and oriented to person, place, and time.  Skin: Skin is warm, dry and intact. No rash noted. She is not diaphoretic. No erythema. No pallor.  Psychiatric: She has a normal mood and affect. Her speech is normal and behavior is normal. Judgment and thought content normal. Cognition and memory are normal.  Nursing note and vitals reviewed.   Results for orders placed or performed in visit on 09/01/16  Microalbumin, urine  Result Value Ref Range   Microalb, Ur 80   Hemoglobin A1c  Result Value Ref Range   Hemoglobin A1C 6.1       Assessment & Plan:   Problem List Items Addressed This Visit      Endocrine   Controlled type 2 diabetes with renal manifestation ([place])    Under good control with A1c of 6.1. Continue current regimen. Refill given. Call with any concerns.        Genitourinary   Benign hypertensive renal disease - Primary    Under good control. Continue current regimen. Refill given. Call with any concerns.        Other   Iron deficiency anemia    Rechecking levels today- possibly  the cause of her RLS, await results and treat as needed.       Relevant Orders   CBC with Differential/Platelet   Comprehensive metabolic panel   UA/M w/rflx Culture, Routine   Anxiety disorder    Under good control. Continue current regimen. Refill given. Call with any concerns.      Relevant Orders   Comprehensive metabolic panel   TSH   UA/M w/rflx Culture, Routine    Other Visit Diagnoses    RLS (restless legs syndrome)       Unclear if nervous habit or RLS- will check labs. Await results. Treat as needed. Call with any concerns.        Follow up plan: Return in about 6 months (around 03/04/2017) for Physical.

## 2016-09-02 LAB — COMPREHENSIVE METABOLIC PANEL
A/G RATIO: 1.6 (ref 1.2–2.2)
ALT: 47 IU/L — ABNORMAL HIGH (ref 0–32)
AST: 51 IU/L — AB (ref 0–40)
Albumin: 4.5 g/dL (ref 3.5–5.5)
Alkaline Phosphatase: 91 IU/L (ref 39–117)
BILIRUBIN TOTAL: 0.3 mg/dL (ref 0.0–1.2)
BUN/Creatinine Ratio: 18 (ref 9–23)
BUN: 14 mg/dL (ref 6–24)
CHLORIDE: 100 mmol/L (ref 96–106)
CO2: 25 mmol/L (ref 20–29)
Calcium: 9.7 mg/dL (ref 8.7–10.2)
Creatinine, Ser: 0.77 mg/dL (ref 0.57–1.00)
GFR calc Af Amer: 102 mL/min/{1.73_m2} (ref >59)
GFR calc non Af Amer: 88 mL/min/{1.73_m2} (ref >59)
GLUCOSE: 118 mg/dL — AB (ref 65–99)
Globulin, Total: 2.9 g/dL (ref 1.5–4.5)
POTASSIUM: 4.3 mmol/L (ref 3.5–5.2)
Sodium: 142 mmol/L (ref 134–144)
Total Protein: 7.4 g/dL (ref 6.0–8.5)

## 2016-09-02 LAB — CBC WITH DIFFERENTIAL/PLATELET
BASOS ABS: 0 10*3/uL (ref 0.0–0.2)
BASOS: 0 %
EOS (ABSOLUTE): 0.1 10*3/uL (ref 0.0–0.4)
Eos: 2 %
Hematocrit: 38 % (ref 34.0–46.6)
Hemoglobin: 12.9 g/dL (ref 11.1–15.9)
Immature Grans (Abs): 0 10*3/uL (ref 0.0–0.1)
Immature Granulocytes: 0 %
Lymphocytes Absolute: 1.5 10*3/uL (ref 0.7–3.1)
Lymphs: 28 %
MCH: 27.9 pg (ref 26.6–33.0)
MCHC: 33.9 g/dL (ref 31.5–35.7)
MCV: 82 fL (ref 79–97)
MONOS ABS: 0.3 10*3/uL (ref 0.1–0.9)
Monocytes: 6 %
NEUTROS ABS: 3.4 10*3/uL (ref 1.4–7.0)
NEUTROS PCT: 64 %
PLATELETS: 271 10*3/uL (ref 150–379)
RBC: 4.62 x10E6/uL (ref 3.77–5.28)
RDW: 14.9 % (ref 12.3–15.4)
WBC: 5.3 10*3/uL (ref 3.4–10.8)

## 2016-09-02 LAB — TSH: TSH: 3.18 u[IU]/mL (ref 0.450–4.500)

## 2016-09-02 LAB — LIPID PANEL W/O CHOL/HDL RATIO
CHOLESTEROL TOTAL: 222 mg/dL — AB (ref 100–199)
HDL: 46 mg/dL (ref >39)
LDL Calculated: 151 mg/dL — ABNORMAL HIGH (ref 0–99)
TRIGLYCERIDES: 126 mg/dL (ref 0–149)
VLDL Cholesterol Cal: 25 mg/dL (ref 5–40)

## 2016-09-03 LAB — UA/M W/RFLX CULTURE, ROUTINE
Bilirubin, UA: NEGATIVE
GLUCOSE, UA: NEGATIVE
Leukocytes, UA: NEGATIVE
NITRITE UA: NEGATIVE
RBC, UA: NEGATIVE
SPEC GRAV UA: 1.02 (ref 1.005–1.030)
UUROB: 4 mg/dL — AB (ref 0.2–1.0)
pH, UA: 7 (ref 5.0–7.5)

## 2016-09-03 LAB — MICROSCOPIC EXAMINATION
BACTERIA UA: NONE SEEN
CAST TYPE: NONE SEEN
CRYSTALS: NONE SEEN
Casts: NONE SEEN /lpf
Crystal Type: NONE SEEN
MUCUS UA: NONE SEEN
RBC, UA: NONE SEEN /hpf (ref 0–?)
Renal Epithel, UA: NONE SEEN /hpf
Trichomonas, UA: NONE SEEN
YEAST UA: NONE SEEN

## 2016-09-03 LAB — MICROALBUMIN, URINE WAIVED
Creatinine, Urine Waived: 200 mg/dL (ref 10–300)
Microalb, Ur Waived: 80 mg/L — ABNORMAL HIGH (ref 0–19)

## 2016-09-03 LAB — BAYER DCA HB A1C WAIVED: HB A1C: 6.1 % (ref <7.0)

## 2016-09-05 ENCOUNTER — Other Ambulatory Visit: Payer: Self-pay | Admitting: Family Medicine

## 2017-01-12 LAB — HM DIABETES EYE EXAM

## 2017-02-25 ENCOUNTER — Encounter: Payer: Self-pay | Admitting: Emergency Medicine

## 2017-02-25 ENCOUNTER — Other Ambulatory Visit: Payer: Self-pay

## 2017-02-25 ENCOUNTER — Observation Stay
Admission: EM | Admit: 2017-02-25 | Discharge: 2017-02-27 | Disposition: A | Discharge status: Home / Self Care | Payer: BLUE CROSS/BLUE SHIELD | Attending: Internal Medicine | Admitting: Internal Medicine

## 2017-02-25 ENCOUNTER — Ambulatory Visit: Payer: Self-pay | Admitting: *Deleted

## 2017-02-25 DIAGNOSIS — K921 Melena: Secondary | ICD-10-CM

## 2017-02-25 DIAGNOSIS — E876 Hypokalemia: Secondary | ICD-10-CM | POA: Diagnosis not present

## 2017-02-25 DIAGNOSIS — F329 Major depressive disorder, single episode, unspecified: Secondary | ICD-10-CM | POA: Insufficient documentation

## 2017-02-25 DIAGNOSIS — K254 Chronic or unspecified gastric ulcer with hemorrhage: Principal | ICD-10-CM | POA: Insufficient documentation

## 2017-02-25 DIAGNOSIS — Z8349 Family history of other endocrine, nutritional and metabolic diseases: Secondary | ICD-10-CM | POA: Diagnosis not present

## 2017-02-25 DIAGNOSIS — Z9889 Other specified postprocedural states: Secondary | ICD-10-CM | POA: Insufficient documentation

## 2017-02-25 DIAGNOSIS — Z83511 Family history of glaucoma: Secondary | ICD-10-CM | POA: Diagnosis not present

## 2017-02-25 DIAGNOSIS — Z86010 Personal history of colonic polyps: Secondary | ICD-10-CM | POA: Diagnosis not present

## 2017-02-25 DIAGNOSIS — K922 Gastrointestinal hemorrhage, unspecified: Secondary | ICD-10-CM | POA: Diagnosis present

## 2017-02-25 DIAGNOSIS — R000 Tachycardia, unspecified: Secondary | ICD-10-CM | POA: Diagnosis not present

## 2017-02-25 DIAGNOSIS — Z79899 Other long term (current) drug therapy: Secondary | ICD-10-CM | POA: Diagnosis not present

## 2017-02-25 DIAGNOSIS — Z8249 Family history of ischemic heart disease and other diseases of the circulatory system: Secondary | ICD-10-CM | POA: Diagnosis not present

## 2017-02-25 DIAGNOSIS — D62 Acute posthemorrhagic anemia: Secondary | ICD-10-CM

## 2017-02-25 DIAGNOSIS — K219 Gastro-esophageal reflux disease without esophagitis: Secondary | ICD-10-CM | POA: Diagnosis not present

## 2017-02-25 DIAGNOSIS — Z8379 Family history of other diseases of the digestive system: Secondary | ICD-10-CM | POA: Insufficient documentation

## 2017-02-25 DIAGNOSIS — Z7984 Long term (current) use of oral hypoglycemic drugs: Secondary | ICD-10-CM | POA: Diagnosis not present

## 2017-02-25 DIAGNOSIS — I10 Essential (primary) hypertension: Secondary | ICD-10-CM | POA: Diagnosis not present

## 2017-02-25 DIAGNOSIS — Z7982 Long term (current) use of aspirin: Secondary | ICD-10-CM | POA: Insufficient documentation

## 2017-02-25 DIAGNOSIS — Z818 Family history of other mental and behavioral disorders: Secondary | ICD-10-CM | POA: Diagnosis not present

## 2017-02-25 DIAGNOSIS — E119 Type 2 diabetes mellitus without complications: Secondary | ICD-10-CM | POA: Insufficient documentation

## 2017-02-25 DIAGNOSIS — R51 Headache: Secondary | ICD-10-CM | POA: Insufficient documentation

## 2017-02-25 DIAGNOSIS — K250 Acute gastric ulcer with hemorrhage: Secondary | ICD-10-CM

## 2017-02-25 HISTORY — DX: Polyp of colon: K63.5

## 2017-02-25 HISTORY — DX: Headache, unspecified: R51.9

## 2017-02-25 HISTORY — DX: Headache: R51

## 2017-02-25 HISTORY — DX: Gastrointestinal hemorrhage, unspecified: K92.2

## 2017-02-25 LAB — COMPREHENSIVE METABOLIC PANEL
ALT: 46 U/L (ref 14–54)
AST: 52 U/L — ABNORMAL HIGH (ref 15–41)
Albumin: 3.9 g/dL (ref 3.5–5.0)
Alkaline Phosphatase: 72 U/L (ref 38–126)
Anion gap: 11 (ref 5–15)
BILIRUBIN TOTAL: 0.7 mg/dL (ref 0.3–1.2)
BUN: 19 mg/dL (ref 6–20)
CALCIUM: 9.2 mg/dL (ref 8.9–10.3)
CHLORIDE: 102 mmol/L (ref 101–111)
CO2: 24 mmol/L (ref 22–32)
CREATININE: 0.68 mg/dL (ref 0.44–1.00)
Glucose, Bld: 126 mg/dL — ABNORMAL HIGH (ref 65–99)
Potassium: 3.5 mmol/L (ref 3.5–5.1)
Sodium: 137 mmol/L (ref 135–145)
TOTAL PROTEIN: 7.2 g/dL (ref 6.5–8.1)

## 2017-02-25 LAB — CBC
HCT: 32.5 % — ABNORMAL LOW (ref 35.0–47.0)
Hemoglobin: 11.2 g/dL — ABNORMAL LOW (ref 12.0–16.0)
MCH: 28.8 pg (ref 26.0–34.0)
MCHC: 34.3 g/dL (ref 32.0–36.0)
MCV: 83.9 fL (ref 80.0–100.0)
Platelets: 364 10*3/uL (ref 150–440)
RBC: 3.88 MIL/uL (ref 3.80–5.20)
RDW: 13.8 % (ref 11.5–14.5)
WBC: 9.5 10*3/uL (ref 3.6–11.0)

## 2017-02-25 LAB — HEMOGLOBIN: Hemoglobin: 9.8 g/dL — ABNORMAL LOW (ref 12.0–16.0)

## 2017-02-25 LAB — TYPE AND SCREEN
ABO/RH(D): B NEG
ANTIBODY SCREEN: NEGATIVE

## 2017-02-25 MED ORDER — PANTOPRAZOLE SODIUM 40 MG IV SOLR
40.0000 mg | Freq: Two times a day (BID) | INTRAVENOUS | Status: DC
  Administered 2017-02-25 – 2017-02-27 (×4): 40 mg via INTRAVENOUS
  Filled 2017-02-25 (×4): qty 40

## 2017-02-25 MED ORDER — ALBUTEROL SULFATE (2.5 MG/3ML) 0.083% IN NEBU: 2.5000 mg | INHALATION_SOLUTION | RESPIRATORY_TRACT | Status: DC | PRN

## 2017-02-25 MED ORDER — SODIUM CHLORIDE 0.9 % IV BOLUS (SEPSIS)
1000.0000 mL | Freq: Once | INTRAVENOUS | Status: AC
  Administered 2017-02-25: 1000 mL via INTRAVENOUS

## 2017-02-25 MED ORDER — PANTOPRAZOLE SODIUM 40 MG PO TBEC: 40.0000 mg | DELAYED_RELEASE_TABLET | Freq: Two times a day (BID) | ORAL | Status: DC

## 2017-02-25 MED ORDER — SERTRALINE HCL 50 MG PO TABS
100.0000 mg | ORAL_TABLET | Freq: Every day | ORAL | Status: DC
  Administered 2017-02-27: 100 mg via ORAL
  Filled 2017-02-25: qty 2

## 2017-02-25 MED ORDER — ACETAMINOPHEN 325 MG PO TABS
650.0000 mg | ORAL_TABLET | Freq: Four times a day (QID) | ORAL | Status: DC | PRN
  Administered 2017-02-26 (×2): 650 mg via ORAL
  Filled 2017-02-25 (×2): qty 2

## 2017-02-25 MED ORDER — PANTOPRAZOLE SODIUM 40 MG IV SOLR
40.0000 mg | Freq: Once | INTRAVENOUS | Status: AC
  Administered 2017-02-25: 40 mg via INTRAVENOUS
  Filled 2017-02-25: qty 40

## 2017-02-25 MED ORDER — SODIUM CHLORIDE 0.9% FLUSH
3.0000 mL | Freq: Two times a day (BID) | INTRAVENOUS | Status: DC
  Administered 2017-02-25 – 2017-02-27 (×4): 3 mL via INTRAVENOUS

## 2017-02-25 MED ORDER — SODIUM CHLORIDE 0.9 % IV SOLN
Freq: Once | INTRAVENOUS | Status: AC
  Administered 2017-02-26 (×2): via INTRAVENOUS

## 2017-02-25 MED ORDER — POLYETHYLENE GLYCOL 3350 17 G PO PACK: 17.0000 g | PACK | Freq: Every day | ORAL | Status: DC | PRN

## 2017-02-25 MED ORDER — ONDANSETRON HCL 4 MG PO TABS: 4.0000 mg | ORAL_TABLET | Freq: Four times a day (QID) | ORAL | Status: DC | PRN

## 2017-02-25 MED ORDER — ACETAMINOPHEN 650 MG RE SUPP: 650.0000 mg | Freq: Four times a day (QID) | RECTAL | Status: DC | PRN

## 2017-02-25 MED ORDER — ONDANSETRON HCL 4 MG/2ML IJ SOLN: 4.0000 mg | Freq: Four times a day (QID) | INTRAMUSCULAR | Status: DC | PRN

## 2017-02-25 NOTE — H&P (Signed)
[facility] [place] NAME: Tabitha Evans    MR#:  [id number]  DATE OF BIRTH:  06-05-1960  DATE OF ADMISSION:  02/25/2017  PRIMARY CARE PHYSICIAN: Valerie Roys, DO   REQUESTING/REFERRING PHYSICIAN: Dr. Jimmye Norman  CHIEF COMPLAINT:   Chief Complaint  Patient presents with  . Dizziness  . Rectal Bleeding    HISTORY OF PRESENT ILLNESS:  Tabitha Evans  is a 57 y.o. female with a known history of hypertension, GERD, colon polyps who takes Tylenol with aspirin daily for headaches presents to the emergency room complaining of painless bloody stool.  4 episodes.  No melena.  Feels dizzy every time she sits or stands up.  Hemoglobin 11.2.  Tachycardic into the 120s.  Blood pressure stable.  4 patient had colonoscopy many years back which was routine and had colon polyps.  No prior history of GI bleed.  Patient is being admitted for GI bleed which seems to be likely lower with anemia. Total of 4 episodes of bloody stool.  PAST MEDICAL HISTORY:   Past Medical History:  Diagnosis Date  . Acid reflux   . Colon polyp   . Frequent headaches   . Hypertension     PAST SURGICAL HISTORY:   Past Surgical History:  Procedure Laterality Date  . COLONOSCOPY    . UPPER GASTROINTESTINAL ENDOSCOPY      SOCIAL HISTORY:   Social History   Tobacco Use  . Smoking status: Never Smoker  . Smokeless tobacco: Never Used  Substance Use Topics  . Alcohol use: Yes    Alcohol/week: 0.0 oz    Comment: on the weekends    FAMILY HISTORY:   Family History  Problem Relation Age of Onset  . Hypertension Mother   . Hyperlipidemia Mother   . Glaucoma Mother   . Liver disease Father   . Cancer Father   . Glaucoma Sister   . Hypertension Brother   . Dementia Maternal Grandmother   . Glaucoma Sister     DRUG ALLERGIES:  No Known Allergies  REVIEW OF SYSTEMS:   Review of Systems  Constitutional: Positive for malaise/fatigue. Negative for chills  and fever.  HENT: Negative for sore throat.   Eyes: Negative for blurred vision, double vision and pain.  Respiratory: Negative for cough, hemoptysis, shortness of breath and wheezing.   Cardiovascular: Negative for chest pain, palpitations, orthopnea and leg swelling.  Gastrointestinal: Positive for blood in stool. Negative for abdominal pain, constipation, diarrhea, heartburn, nausea and vomiting.  Genitourinary: Negative for dysuria and hematuria.  Musculoskeletal: Negative for back pain and joint pain.  Skin: Negative for rash.  Neurological: Positive for dizziness. Negative for sensory change, speech change, focal weakness and headaches.  Endo/Heme/Allergies: Does not bruise/bleed easily.  Psychiatric/Behavioral: Negative for depression. The patient is not nervous/anxious.     MEDICATIONS AT HOME:   Prior to Admission medications   Medication Sig Start Date End Date Taking? Authorizing Provider  aspirin-acetaminophen-caffeine (EXCEDRIN MIGRAINE) [phone] MG tablet Take 2 tablets by mouth daily.   Yes [provider]  ferrous sulfate 325 (65 FE) MG tablet Take 325 mg by mouth daily with breakfast.   Yes [provider]  metFORMIN (GLUCOPHAGE-XR) 500 MG 24 hr tablet TAKE ONE TABLET BY MOUTH ONCE DAILY WITH BREAKFAST 09/07/16  Yes Johnson, Megan P, DO  omeprazole (PRILOSEC) 20 MG capsule Take 1 capsule (20 mg total) by mouth daily. 09/01/16  Yes Johnson, Megan P, DO  sertraline (ZOLOFT) 100  MG tablet Take 1 tablet (100 mg total) by mouth daily. 09/01/16  Yes Johnson, Megan P, DO  triamterene-hydrochlorothiazide (MAXZIDE-25) 37.5-25 MG tablet Take 1 tablet by mouth daily. 09/01/16  Yes Johnson, Megan P, DO     VITAL SIGNS:  Blood pressure 133/80, pulse (!) 121, temperature 98.4 F (36.9 C), temperature source Oral, resp. rate 16, height 5\' 5"  (1.651 m), weight 88.5 kg (195 lb), last menstrual period 09/01/2014, SpO2 98 %.  PHYSICAL EXAMINATION:  Physical  Exam  GENERAL:  57 y.o.-year-old patient lying in the bed with no acute distress.  EYES: Pupils equal, round, reactive to light and accommodation. No scleral icterus. Extraocular muscles intact.  HEENT: Head atraumatic, normocephalic. Oropharynx and nasopharynx clear. No oropharyngeal erythema, moist oral mucosa  NECK:  Supple, no jugular venous distention. No thyroid enlargement, no tenderness.  LUNGS: Normal breath sounds bilaterally, no wheezing, rales, rhonchi. No use of accessory muscles of respiration.  CARDIOVASCULAR: S1, S2 normal. No murmurs, rubs, or gallops.  ABDOMEN: Soft, nontender, nondistended. Bowel sounds present. No organomegaly or mass.  EXTREMITIES: No pedal edema, cyanosis, or clubbing. + 2 pedal & radial pulses b/l.   NEUROLOGIC: Cranial nerves II through XII are intact. No focal Motor or sensory deficits appreciated b/l PSYCHIATRIC: The patient is alert and oriented x 3. Good affect.  SKIN: No obvious rash, lesion, or ulcer.   LABORATORY PANEL:   CBC Recent Labs  Lab 02/25/17 1255  WBC 9.5  HGB 11.2*  HCT 32.5*  PLT 364   ------------------------------------------------------------------------------------------------------------------  Chemistries  Recent Labs  Lab 02/25/17 1255  NA 137  K 3.5  CL 102  CO2 24  GLUCOSE 126*  BUN 19  CREATININE 0.68  CALCIUM 9.2  AST 52*  ALT 46  ALKPHOS 72  BILITOT 0.7   ------------------------------------------------------------------------------------------------------------------  Cardiac Enzymes No results for input(s): TROPONINI in the last 168 hours. ------------------------------------------------------------------------------------------------------------------  RADIOLOGY:  No results found.   IMPRESSION AND PLAN:   *GI bleed likely lower.  Could be due to colon polyps or diverticular.  Tachycardic and has acute blood loss anemia.  No need for transfusion at this time.  We will bolus 1 L normal  saline stent.  Repeat hemoglobin later today evening.  Transfuse if hypotensive or hemoglobin less than 8.  Consult GI. Hold aspirin.  *Hypertension.  Hold blood pressure medications at this time.  *DVT prophylaxis with SCDs  All the records are reviewed and case discussed with ED provider. Management plans discussed with the patient, family and they are in agreement.  CODE STATUS: Full Code  TOTAL TIME TAKING CARE OF THIS PATIENT: 40 minutes.   Neita Carp M.D on 02/25/2017 at 2:34 PM  Between 7am to 6pm - Pager - [phone]  After 6pm go to [email or web address] - password [email or web address] [facility]  Office  [phone]  CC: Primary care physician; Valerie Roys, DO  Note: This dictation was prepared with Dragon dictation along with smaller phrase technology. Any transcriptional errors that result from this process are unintentional.

## 2017-02-25 NOTE — ED Provider Notes (Addendum)
[facility] Emergency Department Provider Note       Time seen: ----------------------------------------- 1:54 PM on 02/25/2017 -----------------------------------------   I have reviewed the triage vital signs and the nursing notes.  HISTORY   Chief Complaint Dizziness and Rectal Bleeding    HPI Tabitha Evans is a 57 y.o. female with a history of GERD and hypertension who presents to the ED for bloody stools and dizziness since last night.  Patient states she has been having dark red stools.  Patient states she has had about 4 episodes of passing large amount of blood that is very dark in appearance.  It is painless, she has never had this happen before.  She has had a colonoscopy years ago which was routine.  She denies fevers, chills or other complaints.  She denies recent stomach upset or GERD.  She does take omeprazole.  Past Medical History:  Diagnosis Date  . Acid reflux   . Hypertension     Patient Active Problem List   Diagnosis Date Noted  . Anxiety disorder 07/23/2015  . Controlled type 2 diabetes with renal manifestation ([place]) 09/18/2014  . Benign hypertensive renal disease 09/18/2014  . Iron deficiency anemia 09/18/2014  . Candidal intertrigo 09/18/2014  . Breast mass 06/30/2012    Past Surgical History:  Procedure Laterality Date  . COLONOSCOPY    . UPPER GASTROINTESTINAL ENDOSCOPY      Allergies Patient has no known allergies.  Social History Social History   Tobacco Use  . Smoking status: Never Smoker  . Smokeless tobacco: Never Used  Substance Use Topics  . Alcohol use: Yes    Alcohol/week: 0.0 oz    Comment: on the weekends  . Drug use: No    Review of Systems Constitutional: Negative for fever. Cardiovascular: Negative for chest pain. Respiratory: Negative for shortness of breath. Gastrointestinal: Negative for abdominal pain, vomiting and diarrhea.  Positive for rectal bleeding Genitourinary: Negative for  dysuria. Musculoskeletal: Negative for back pain. Skin: Negative for rash. Neurological: Negative for headaches, focal weakness or numbness.  All systems negative/normal/unremarkable except as stated in the HPI  ____________________________________________   PHYSICAL EXAM:  VITAL SIGNS: ED Triage Vitals [02/25/17 1250]  Enc Vitals Group     BP 133/80     Pulse Rate (!) 121     Resp 16     Temp 98.4 F (36.9 C)     Temp Source Oral     SpO2 98 %     Weight 195 lb (88.5 kg)     Height 5\' 5"  (1.651 m)     Head Circumference      Peak Flow      Pain Score      Pain Loc      Pain Edu?      Excl. in [place]?     Constitutional: Alert and oriented. Well appearing and in no distress. Eyes: Conjunctivae are normal. Normal extraocular movements. ENT   Head: Normocephalic and atraumatic.   Nose: No congestion/rhinnorhea.   Mouth/Throat: Mucous membranes are moist.   Neck: No stridor. Cardiovascular: rapid rate, regular rhythm. No murmurs, rubs, or gallops. Respiratory: Normal respiratory effort without tachypnea nor retractions. Breath sounds are clear and equal bilaterally. No wheezes/rales/rhonchi. Gastrointestinal: Soft and nontender. Normal bowel sounds Rectal: Black stool, grossly heme positive Musculoskeletal: Nontender with normal range of motion in extremities. No lower extremity tenderness nor edema. Neurologic:  Normal speech and language. No gross focal neurologic deficits are appreciated.  Skin:  Skin is warm, dry and intact. No rash noted. Psychiatric: Mood and affect are normal. Speech and behavior are normal.  ____________________________________________  ED COURSE:  As part of my medical decision making, I reviewed the following data within the [place] History obtained from family if available, nursing notes, old chart and ekg, as well as notes from prior ED visits. Patient presented for possible gastrointestinal bleeding, we will  assess with labs as indicated at this time.   Procedures ____________________________________________   LABS (pertinent positives/negatives)  Labs Reviewed  COMPREHENSIVE METABOLIC PANEL - Abnormal; Notable for the following components:      Result Value   Glucose, Bld 126 (*)    AST 52 (*)    All other components within normal limits  CBC - Abnormal; Notable for the following components:   Hemoglobin 11.2 (*)    HCT 32.5 (*)    All other components within normal limits  POC OCCULT BLOOD, ED  TYPE AND SCREEN   EKG: Interpreted by me, sinus tachycardia with a rate of 108 bpm, normal PR interval, normal QRS, normal QT. ____________________________________________  DIFFERENTIAL DIAGNOSIS   Upper GI bleed, lower GI bleed, anemia, peptic ulcer disease, diverticular bleeding  FINAL ASSESSMENT AND PLAN  Gastrointestinal bleeding, anemia   Plan: Patient had presented for passing dark stool and feeling dizzy.  She was tachycardic here on arrival.  Patient's labs do reveal some anemia which is new for her.  I will discussed with gastroenterology but likely she will be observed in the hospital for further bleeding and may end up needing endoscopy and/or colonoscopy.  She will be placed on IV Protonix.   Earleen Newport, MD   Note: This note was generated in part or whole with voice recognition software. Voice recognition is usually quite accurate but there are transcription errors that can and very often do occur. I apologize for any typographical errors that were not detected and corrected.     Earleen Newport, MD 02/25/17 1405    Earleen Newport, MD 02/25/17 1408    Earleen Newport, MD 02/25/17 [phone]

## 2017-02-25 NOTE — ED Triage Notes (Signed)
Says bloody stools and dizziness since last night.   Dark red stools.

## 2017-02-25 NOTE — Telephone Encounter (Signed)
Pt reports rectal bleeding since last night. Total of 4 episodes, last one this AM. States large- moderate amounts each time. States at times occurs with stool (watery) other times "just the dark red blood." Mild dizziness. Denies any abdominal pain, cramping. Post menopausal. States is "definitely not vaginal."  Instructed to go to ED. Son will drive. Instructed to remain NPO until seen. Reason for Disposition . [1] MODERATE rectal bleeding (small blood clots, passing blood without stool, or toilet water turns red) AND [2] more than once a day  Answer Assessment - Initial Assessment Questions 1. APPEARANCE of BLOOD: "What color is it?" "Is it passed separately, on the surface of the stool, or mixed in with the stool?"      Dark red, mixed in stool at times 2. AMOUNT: "How much blood was passed?"      Large amount 3. FREQUENCY: "How many times has blood been passed with the stools?"      4 episodes since last night 4. ONSET: "When was the blood first seen in the stools?" (Days or weeks)     Last night 5. DIARRHEA: "Is there also some diarrhea?" If so, ask: "How many diarrhea stools were passed in past 24 hours?"      At times 6. CONSTIPATION: "Do you have constipation?" If so, "How bad is it?"     no 7. RECURRENT SYMPTOMS: "Have you had blood in your stools before?" If so, ask: "When was the last time?" and "What happened that time?"      no 8. BLOOD THINNERS: "Do you take any blood thinners?" (e.g., Coumadin/warfarin, Pradaxa/dabigatran, aspirin)     no 9. OTHER SYMPTOMS: "Do you have any other symptoms?"  (e.g., abdominal pain, vomiting, dizziness, fever)    Mild dizziness 10. PREGNANCY: "Is there any chance you are pregnant?" "When was your last menstrual period?"       no  Protocols used: RECTAL BLEEDING-A-AH

## 2017-02-26 ENCOUNTER — Other Ambulatory Visit: Payer: Self-pay

## 2017-02-26 ENCOUNTER — Encounter
Admission: EM | Disposition: A | Discharge status: Home / Self Care | Payer: Self-pay | Source: Home / Self Care | Attending: Emergency Medicine

## 2017-02-26 ENCOUNTER — Inpatient Hospital Stay: Payer: BLUE CROSS/BLUE SHIELD | Admitting: Anesthesiology

## 2017-02-26 ENCOUNTER — Encounter: Payer: Self-pay | Admitting: *Deleted

## 2017-02-26 DIAGNOSIS — K250 Acute gastric ulcer with hemorrhage: Secondary | ICD-10-CM

## 2017-02-26 DIAGNOSIS — Z791 Long term (current) use of non-steroidal anti-inflammatories (NSAID): Secondary | ICD-10-CM

## 2017-02-26 DIAGNOSIS — K921 Melena: Secondary | ICD-10-CM

## 2017-02-26 DIAGNOSIS — D62 Acute posthemorrhagic anemia: Secondary | ICD-10-CM

## 2017-02-26 HISTORY — PX: ESOPHAGOGASTRODUODENOSCOPY: SHX5428

## 2017-02-26 LAB — BASIC METABOLIC PANEL
ANION GAP: 11 (ref 5–15)
BUN: 11 mg/dL (ref 6–20)
CO2: 24 mmol/L (ref 22–32)
Calcium: 9 mg/dL (ref 8.9–10.3)
Chloride: 101 mmol/L (ref 101–111)
Creatinine, Ser: 0.64 mg/dL (ref 0.44–1.00)
Glucose, Bld: 127 mg/dL — ABNORMAL HIGH (ref 65–99)
POTASSIUM: 3.3 mmol/L — AB (ref 3.5–5.1)
SODIUM: 136 mmol/L (ref 135–145)

## 2017-02-26 LAB — HIV ANTIBODY (ROUTINE TESTING W REFLEX): HIV SCREEN 4TH GENERATION: NONREACTIVE

## 2017-02-26 LAB — CBC
HEMATOCRIT: 30 % — AB (ref 35.0–47.0)
Hemoglobin: 9.9 g/dL — ABNORMAL LOW (ref 12.0–16.0)
MCH: 27.9 pg (ref 26.0–34.0)
MCHC: 33 g/dL (ref 32.0–36.0)
MCV: 84.5 fL (ref 80.0–100.0)
Platelets: 315 10*3/uL (ref 150–440)
RBC: 3.55 MIL/uL — AB (ref 3.80–5.20)
RDW: 13.8 % (ref 11.5–14.5)
WBC: 7.4 10*3/uL (ref 3.6–11.0)

## 2017-02-26 LAB — HEMOGLOBIN: Hemoglobin: 9.4 g/dL — ABNORMAL LOW (ref 12.0–16.0)

## 2017-02-26 SURGERY — EGD (ESOPHAGOGASTRODUODENOSCOPY)
Anesthesia: General

## 2017-02-26 MED ORDER — PROPOFOL 10 MG/ML IV BOLUS
INTRAVENOUS | Status: DC | PRN
  Administered 2017-02-26: 30 mg via INTRAVENOUS
  Administered 2017-02-26: 20 mg via INTRAVENOUS

## 2017-02-26 MED ORDER — FENTANYL CITRATE (PF) 100 MCG/2ML IJ SOLN
INTRAMUSCULAR | Status: DC | PRN
  Administered 2017-02-26 (×2): 50 ug via INTRAVENOUS

## 2017-02-26 MED ORDER — PROPOFOL 500 MG/50ML IV EMUL
INTRAVENOUS | Status: AC
  Filled 2017-02-26: qty 50

## 2017-02-26 MED ORDER — MIDAZOLAM HCL 5 MG/5ML IJ SOLN
INTRAMUSCULAR | Status: DC | PRN
  Administered 2017-02-26: 2 mg via INTRAVENOUS

## 2017-02-26 MED ORDER — MIDAZOLAM HCL 2 MG/2ML IJ SOLN
INTRAMUSCULAR | Status: AC
  Filled 2017-02-26: qty 2

## 2017-02-26 MED ORDER — GLYCOPYRROLATE 0.2 MG/ML IJ SOLN
INTRAMUSCULAR | Status: AC
  Filled 2017-02-26: qty 1

## 2017-02-26 MED ORDER — LIDOCAINE HCL (PF) 2 % IJ SOLN
INTRAMUSCULAR | Status: AC
  Filled 2017-02-26: qty 10

## 2017-02-26 MED ORDER — FENTANYL CITRATE (PF) 100 MCG/2ML IJ SOLN
INTRAMUSCULAR | Status: AC
  Filled 2017-02-26: qty 2

## 2017-02-26 MED ORDER — POTASSIUM CHLORIDE CRYS ER 20 MEQ PO TBCR: 40.0000 meq | EXTENDED_RELEASE_TABLET | Freq: Once | ORAL | Status: DC

## 2017-02-26 MED ORDER — LIDOCAINE HCL (PF) 2 % IJ SOLN
INTRAMUSCULAR | Status: DC | PRN
  Administered 2017-02-26: 100 mg

## 2017-02-26 MED ORDER — GLYCOPYRROLATE 0.2 MG/ML IJ SOLN
INTRAMUSCULAR | Status: DC | PRN
  Administered 2017-02-26: 0.2 mg via INTRAVENOUS

## 2017-02-26 MED ORDER — SODIUM CHLORIDE 0.9 % IV SOLN
INTRAVENOUS | Status: DC
  Administered 2017-02-26: 13:00:00 via INTRAVENOUS

## 2017-02-26 MED ORDER — PROPOFOL 500 MG/50ML IV EMUL
INTRAVENOUS | Status: DC | PRN
  Administered 2017-02-26: 75 ug/kg/min via INTRAVENOUS

## 2017-02-26 NOTE — Op Note (Signed)
[facility] Gastroenterology Patient Name: Tabitha Evans Procedure Date: 02/26/2017 1:53 PM MRN: [id number] Account #: [id number] Date of Birth: 14-Mar-1960 Admit Type: Outpatient Age: 57 Room: [facility] ENDO ROOM 3 Gender: Female Note Status: Finalized Procedure:            Upper GI endoscopy Indications:          Hematochezia Providers:            Kylii Ennis B. Bonna Gains MD, MD Referring MD:         Forest Gleason Md, MD (Referring MD) Medicines:            Monitored Anesthesia Care Complications:        No immediate complications. Procedure:            Pre-Anesthesia Assessment:                       - The risks and benefits of the procedure and the                        sedation options and risks were discussed with the                        patient. All questions were answered and informed                        consent was obtained.                       - Patient identification and proposed procedure were                        verified prior to the procedure.                       - ASA Grade Assessment: III - A patient with severe                        systemic disease.                       After obtaining informed consent, the endoscope was                        passed under direct vision. Throughout the procedure,                        the patient's blood pressure, pulse, and oxygen                        saturations were monitored continuously. The                        Colonoscope was introduced through the mouth, and                        advanced to the second part of duodenum. The upper GI                        endoscopy was accomplished with ease. The patient  tolerated the procedure well. Findings:      The examined esophagus was normal.      One oozing superficial gastric ulcer was found in the gastric body. The       lesion was 4 mm in largest dimension. For hemostasis, four hemostatic       clips were successfully placed.  There was no bleeding at the end of the       procedure. No biopsies were taken due to the active bleeding.      The duodenal bulb and second portion of the duodenum were normal.      Patchy mildly erythematous mucosa without bleeding was found in the       gastric antrum. Biopsies were not taken due to recent bleeding. Impression:           - Normal esophagus.                       - Oozing gastric ulcer. Clips were placed.                       - Normal duodenal bulb and second portion of the                        duodenum.                       - Erythematous mucosa in the antrum.                       - No specimens collected. Recommendation:       - Use Protonix (pantoprazole) 40 mg IV BID.                       - Transition PPI to oral BID at time of discharge.                        Continue for 4 weeks then switch to daily for another 8                        weeks.                       Discontinue NSAIDs                       Obtain stool for H. Pylori                       Continue Serial CBCs and transfuse PRN                       Clear liquid diet today and then can advance diet                        tomorrow if no further active GI bleeding or worsening                        anemia                       Observe is hospital for 24-48 hrs prior to discharge                       -  The findings and recommendations were discussed with                        the patient.                       - Return to my office in 4 weeks. Procedure Code(s):    --- Professional ---                       [phone], Esophagogastroduodenoscopy, flexible, transoral;                        with control of bleeding, any method Diagnosis Code(s):    --- Professional ---                       K25.4, Chronic or unspecified gastric ulcer with                        hemorrhage                       K92.1, Melena (includes Hematochezia) CPT copyright 2016 American Medical Association. All rights  reserved. The codes documented in this report are preliminary and upon coder review may  be revised to meet current compliance requirements.  [name], MD Margretta Sidle B. Bonna Gains MD, MD 02/26/2017 2:29:06 PM This report has been signed electronically. Number of Addenda: 0 Note Initiated On: 02/26/2017 1:53 PM      [facility]

## 2017-02-26 NOTE — Consult Note (Signed)
[name], MD [address], [place], [place], [place], [address] [address], [place], [place], [place], [address] Phone: [phone]  Fax: [phone]  Consultation  Referring Provider:     Dr. Tressia Miners Primary Care Physician:  Valerie Roys, DO Primary Gastroenterologist:  Virgel Manifold, MD        Reason for Consultation:     Hematochezia  Date of Admission:  02/25/2017 Date of Consultation:  02/26/2017         HPI:   Tabitha Evans is a 57 y.o. female presents with 1-2-day history of dark red output with her stools.  Denies seeing any blood clots or melena.  Reports multiple bowel movements with dark red blood in the stool, medium in quantity.  Denies any abdominal pain.  Reports Excedrin use, 2 tablets daily for months.  Denies any episodes of emesis or hematemesis.  Denies any previous history of GI bleeding.  Reports previous history of EGD and colonoscopy.  On review of provation records she had a colonoscopy in 2007 for iron deficiency anemia which showed patchy nonbleeding angiectasias, diverticulosis, but no source of her iron deficiency.  EGD was done and was normal.  Small bowel capsule was recommended but it is unclear if this was done.  Her hemoglobin 5 months ago was 12.9.  On presentation was 11.2, and subsequently 9.8.  No episodes of GI bleeding in the hospital.  Her MCV is low normal at 84.    Past Medical History:  Diagnosis Date  . Acid reflux   . Colon polyp   . Frequent headaches   . Hypertension     Past Surgical History:  Procedure Laterality Date  . COLONOSCOPY    . UPPER GASTROINTESTINAL ENDOSCOPY      Prior to Admission medications   Medication Sig Start Date End Date Taking? Authorizing Provider  aspirin-acetaminophen-caffeine (EXCEDRIN MIGRAINE) [phone] MG tablet Take 2 tablets by mouth daily.   Yes [provider]  ferrous sulfate 325 (65 FE) MG tablet Take 325 mg by mouth daily with breakfast.   Yes  [provider]  metFORMIN (GLUCOPHAGE-XR) 500 MG 24 hr tablet TAKE ONE TABLET BY MOUTH ONCE DAILY WITH BREAKFAST 09/07/16  Yes Johnson, Megan P, DO  omeprazole (PRILOSEC) 20 MG capsule Take 1 capsule (20 mg total) by mouth daily. 09/01/16  Yes Johnson, Megan P, DO  sertraline (ZOLOFT) 100 MG tablet Take 1 tablet (100 mg total) by mouth daily. 09/01/16  Yes Johnson, Megan P, DO  triamterene-hydrochlorothiazide (MAXZIDE-25) 37.5-25 MG tablet Take 1 tablet by mouth daily. 09/01/16  Yes Johnson, Megan P, DO    Family History  Problem Relation Age of Onset  . Hypertension Mother   . Hyperlipidemia Mother   . Glaucoma Mother   . Liver disease Father   . Cancer Father   . Glaucoma Sister   . Hypertension Brother   . Dementia Maternal Grandmother   . Glaucoma Sister      Social History   Tobacco Use  . Smoking status: Never Smoker  . Smokeless tobacco: Never Used  Substance Use Topics  . Alcohol use: Yes    Alcohol/week: 0.0 oz    Comment: on the weekends  . Drug use: No    Allergies as of 02/25/2017  . (No Known Allergies)    Review of Systems:    All systems reviewed and negative except where noted in HPI.   Physical Exam:  Vital signs in last 24 hours: Vitals:  02/25/17 1719 02/25/17 1943 02/26/17 0511 02/26/17 0736  BP: (!) 127/58 119/73 128/69 125/70  Pulse: 80 91 78 74  Resp: 18 18 18    Temp: 97.9 F (36.6 C) 98.1 F (36.7 C) 97.8 F (36.6 C) 97.9 F (36.6 C)  TempSrc: Oral Oral Oral Oral  SpO2: 100% 100% 100% 99%  Weight: 196 lb 1.6 oz (89 kg)  194 lb 12.8 oz (88.4 kg)   Height: 5\' 5"  (1.651 m)      Last BM Date: 02/25/17 General:   Pleasant, cooperative in NAD Head:  Normocephalic and atraumatic. Eyes:   No icterus.   Conjunctiva pink. PERRLA. Ears:  Normal auditory acuity. Neck:  Supple; no masses or thyroidomegaly Lungs: Respirations even and unlabored. Lungs clear to auscultation bilaterally.   No wheezes, crackles, or rhonchi.  Heart:  Regular  rate and rhythm;  Without murmur, clicks, rubs or gallops Abdomen:  Soft, nondistended, nontender. Normal bowel sounds. No appreciable masses or hepatomegaly.  No rebound or guarding.  Neurologic:  Alert and oriented x3;  grossly normal neurologically. Skin:  Intact without significant lesions or rashes. Cervical Nodes:  No significant cervical adenopathy. Psych:  Alert and cooperative. Normal affect.  LAB RESULTS: Recent Labs    02/25/17 1255 02/25/17 1822 02/26/17 0537  WBC 9.5  --  7.4  HGB 11.2* 9.8* 9.9*  HCT 32.5*  --  30.0*  PLT 364  --  315   BMET Recent Labs    02/25/17 1255 02/26/17 0537  NA 137 136  K 3.5 3.3*  CL 102 101  CO2 24 24  GLUCOSE 126* 127*  BUN 19 11  CREATININE 0.68 0.64  CALCIUM 9.2 9.0   LFT Recent Labs    02/25/17 1255  PROT 7.2  ALBUMIN 3.9  AST 52*  ALT 46  ALKPHOS 72  BILITOT 0.7   PT/INR No results for input(s): LABPROT, INR in the last 72 hours.  STUDIES: No results found.    Impression / Plan:   Tabitha Evans is a 57 y.o. y/o female with hematochezia in the setting of excedrin use daily at home and acute anemia  We will proceed with EGD to evaluate for peptic ulcer disease due to her ongoing Excedrin use Continue Protonix 40 mg IV twice daily Avoid NSAIDs Continue serial CBCs and transfuse as needed If EGD is negative, colonoscopy would be the next step    Thank you for involving me in the care of this patient.      LOS: 1 day   Virgel Manifold, MD  02/26/2017, 11:19 AM

## 2017-02-26 NOTE — Anesthesia Post-op Follow-up Note (Signed)
Anesthesia QCDR form completed.        

## 2017-02-26 NOTE — Anesthesia Preprocedure Evaluation (Signed)
Anesthesia Evaluation  Patient identified by MRN, date of birth, ID band Patient awake    Reviewed: Allergy & Precautions, H&P , NPO status , Patient's Chart, lab work & pertinent test results  History of Anesthesia Complications Negative for: history of anesthetic complications  Airway Mallampati: III  TM Distance: >3 FB Neck ROM: full    Dental  (+) Chipped   Pulmonary neg pulmonary ROS, neg shortness of breath,           Cardiovascular Exercise Tolerance: Good hypertension, (-) angina(-) Past MI and (-) DOE      Neuro/Psych  Headaches, PSYCHIATRIC DISORDERS Anxiety negative psych ROS   GI/Hepatic Neg liver ROS, GERD  Medicated and Controlled,  Endo/Other  diabetes, Type 2  Renal/GU Renal disease  negative genitourinary   Musculoskeletal   Abdominal   Peds  Hematology negative hematology ROS (+)   Anesthesia Other Findings Patient is NPO appropriate and reports no nausea or vomiting today.  Past Medical History: No date: Acid reflux No date: Colon polyp No date: Frequent headaches No date: Hypertension  Past Surgical History: No date: COLONOSCOPY No date: UPPER GASTROINTESTINAL ENDOSCOPY  BMI    Body Mass Index:  32.42 kg/m      Reproductive/Obstetrics negative OB ROS                             Anesthesia Physical Anesthesia Plan  ASA: III  Anesthesia Plan: General   Post-op Pain Management:    Induction: Intravenous  PONV Risk Score and Plan: Propofol infusion  Airway Management Planned: Natural Airway and Nasal Cannula  Additional Equipment:   Intra-op Plan:   Post-operative Plan:   Informed Consent: I have reviewed the patients History and Physical, chart, labs and discussed the procedure including the risks, benefits and alternatives for the proposed anesthesia with the patient or authorized representative who has indicated his/her understanding and  acceptance.   Dental Advisory Given  Plan Discussed with: Anesthesiologist, CRNA and Surgeon  Anesthesia Plan Comments: (Patient consented for risks of anesthesia including but not limited to:  - adverse reactions to medications - risk of intubation if required - damage to teeth, lips or other oral mucosa - sore throat or hoarseness - Damage to heart, brain, lungs or loss of life  Patient voiced understanding.)        Anesthesia Quick Evaluation

## 2017-02-26 NOTE — Progress Notes (Signed)
MD Tressia Miners was made aware of egd results. No further concerns at this time.

## 2017-02-26 NOTE — Progress Notes (Signed)
[facility] [place] NAME: Tabitha Evans    MR#:  [id number]  DATE OF BIRTH:  20-Jan-1961  SUBJECTIVE:  CHIEF COMPLAINT:   Chief Complaint  Patient presents with  . Dizziness  . Rectal Bleeding   - came in rectal bleeding. Also taking NSAIDS at home - no further bleeding today, hb did drop to 9.9  REVIEW OF SYSTEMS:  Review of Systems  Constitutional: Negative for chills, fever and malaise/fatigue.  HENT: Negative for congestion, ear discharge, hearing loss and nosebleeds.   Eyes: Negative for blurred vision and double vision.  Respiratory: Negative for cough, shortness of breath and wheezing.   Cardiovascular: Negative for chest pain, palpitations and leg swelling.  Gastrointestinal: Positive for abdominal pain and blood in stool. Negative for constipation, diarrhea, nausea and vomiting.  Genitourinary: Negative for dysuria.  Musculoskeletal: Negative for myalgias.  Neurological: Negative for dizziness, speech change, focal weakness, seizures and headaches.  Psychiatric/Behavioral: Negative for depression.    DRUG ALLERGIES:  No Known Allergies  VITALS:  Blood pressure 125/70, pulse 74, temperature 97.9 F (36.6 C), temperature source Oral, resp. rate 18, height 5\' 5"  (1.651 m), weight 88.4 kg (194 lb 12.8 oz), last menstrual period 09/01/2014, SpO2 99 %.  PHYSICAL EXAMINATION:  Physical Exam  GENERAL:  57 y.o.-year-old patient lying in the bed with no acute distress.  EYES: Pupils equal, round, reactive to light and accommodation. No scleral icterus. Extraocular muscles intact.  HEENT: Head atraumatic, normocephalic. Oropharynx and nasopharynx clear.  NECK:  Supple, no jugular venous distention. No thyroid enlargement, no tenderness.  LUNGS: Normal breath sounds bilaterally, no wheezing, rales,rhonchi or crepitation. No use of accessory muscles of respiration.  CARDIOVASCULAR: S1, S2 normal. No murmurs, rubs, or gallops.    ABDOMEN: Soft, nontender, nondistended. Bowel sounds present. No organomegaly or mass.  EXTREMITIES: No pedal edema, cyanosis, or clubbing.  NEUROLOGIC: Cranial nerves II through XII are intact. Muscle strength 5/5 in all extremities. Sensation intact. Gait not checked.  PSYCHIATRIC: The patient is alert and oriented x 3.  SKIN: No obvious rash, lesion, or ulcer.    LABORATORY PANEL:   CBC Recent Labs  Lab 02/26/17 0537  WBC 7.4  HGB 9.9*  HCT 30.0*  PLT 315   ------------------------------------------------------------------------------------------------------------------  Chemistries  Recent Labs  Lab 02/25/17 1255 02/26/17 0537  NA 137 136  K 3.5 3.3*  CL 102 101  CO2 24 24  GLUCOSE 126* 127*  BUN 19 11  CREATININE 0.68 0.64  CALCIUM 9.2 9.0  AST 52*  --   ALT 46  --   ALKPHOS 72  --   BILITOT 0.7  --    ------------------------------------------------------------------------------------------------------------------  Cardiac Enzymes No results for input(s): TROPONINI in the last 168 hours. ------------------------------------------------------------------------------------------------------------------  RADIOLOGY:  No results found.  EKG:   Orders placed or performed during the hospital encounter of 02/25/17  . EKG 12-Lead  . EKG 12-Lead    ASSESSMENT AND PLAN:   57y/o F with PMH significant for HTN, GERD and colonic polyps admitted for rectal bleed. Tachycardic and dizzy on admission.  1. GI bleed- could be lower as bright red blood, but since use of NSAIDS at home- EGD today - appreciate GI consult - continue IV protonix - IF EGD negative- colonoscopy either as inpatient or outpatient  2. Anemia- acute on chronic from GI losses - baseline around 11-12, now at 9.9 - no active bleeding now - no indication for transfusion  3. Hypokalemia-  being replaced  4. Depression- on zoloft  5. Diabetes- hold metformin, since NPO  6. DVT Prophylaxis-  TEDs and SCDs    All the records are reviewed and case discussed with Care Management/Social Workerr. Management plans discussed with the patient, family and they are in agreement.  CODE STATUS: Full Code  TOTAL TIME TAKING CARE OF THIS PATIENT: 38 minutes.   POSSIBLE D/C IN 1-2 DAYS, DEPENDING ON CLINICAL CONDITION.   Gladstone Lighter M.D on 02/26/2017 at 12:16 PM  Between 7am to 6pm - Pager - [phone]  After 6pm go to [email or web address] - password [email or web address] [facility]  Office  [phone]  CC: Primary care physician; Valerie Roys, DO

## 2017-02-26 NOTE — Transfer of Care (Signed)
Immediate Anesthesia Transfer of Care Note  Patient: Tabitha Evans  Procedure(s) Performed: ESOPHAGOGASTRODUODENOSCOPY (EGD) (N/A )  Patient Location: PACU  Anesthesia Type:General  Level of Consciousness: sedated  Airway & Oxygen Therapy: Patient Spontanous Breathing and Patient connected to nasal cannula oxygen  Post-op Assessment: Report given to RN and Post -op Vital signs reviewed and stable  Post vital signs: Reviewed and stable  Last Vitals:  Vitals:   02/26/17 1251 02/26/17 1427  BP: 116/65 126/83  Pulse: 72   Resp: 16   Temp: 36.5 C (!) 35.9 C  SpO2: 100%     Last Pain:  Vitals:   02/26/17 1427  TempSrc: Tympanic         Complications: No apparent anesthesia complications

## 2017-02-26 NOTE — Anesthesia Postprocedure Evaluation (Signed)
Anesthesia Post Note  Patient: Tabitha Evans  Procedure(s) Performed: ESOPHAGOGASTRODUODENOSCOPY (EGD) (N/A )  Patient location during evaluation: Endoscopy Anesthesia Type: General Level of consciousness: awake and alert Pain management: pain level controlled Vital Signs Assessment: post-procedure vital signs reviewed and stable Respiratory status: spontaneous breathing, nonlabored ventilation, respiratory function stable and patient connected to nasal cannula oxygen Cardiovascular status: blood pressure returned to baseline and stable Postop Assessment: no apparent nausea or vomiting Anesthetic complications: no     Last Vitals:  Vitals:   02/26/17 1427 02/26/17 1437  BP: 126/83   Pulse:  88  Resp:  16  Temp: (!) 35.9 C   SpO2:      Last Pain:  Vitals:   02/26/17 1427  TempSrc: Tympanic                 Arianah Torgeson S

## 2017-02-27 ENCOUNTER — Encounter: Payer: Self-pay | Admitting: Gastroenterology

## 2017-02-27 LAB — BASIC METABOLIC PANEL
ANION GAP: 6 (ref 5–15)
BUN: 9 mg/dL (ref 6–20)
CALCIUM: 8.6 mg/dL — AB (ref 8.9–10.3)
CO2: 28 mmol/L (ref 22–32)
CREATININE: 0.72 mg/dL (ref 0.44–1.00)
Chloride: 106 mmol/L (ref 101–111)
Glucose, Bld: 166 mg/dL — ABNORMAL HIGH (ref 65–99)
Potassium: 3.2 mmol/L — ABNORMAL LOW (ref 3.5–5.1)
SODIUM: 140 mmol/L (ref 135–145)

## 2017-02-27 LAB — CBC
HEMATOCRIT: 25.8 % — AB (ref 35.0–47.0)
Hemoglobin: 9 g/dL — ABNORMAL LOW (ref 12.0–16.0)
MCH: 29.3 pg (ref 26.0–34.0)
MCHC: 34.8 g/dL (ref 32.0–36.0)
MCV: 84.2 fL (ref 80.0–100.0)
PLATELETS: 238 10*3/uL (ref 150–440)
RBC: 3.06 MIL/uL — ABNORMAL LOW (ref 3.80–5.20)
RDW: 13.8 % (ref 11.5–14.5)
WBC: 5.8 10*3/uL (ref 3.6–11.0)

## 2017-02-27 MED ORDER — OMEPRAZOLE 40 MG PO CPDR: 40.0000 mg | DELAYED_RELEASE_CAPSULE | Freq: Two times a day (BID) | ORAL | 0 refills | Status: DC

## 2017-02-27 NOTE — Progress Notes (Signed)
MD notified via amion. Pt asking about advancing diet. MD advances to full liquid. I will continue to assess.

## 2017-03-03 ENCOUNTER — Other Ambulatory Visit: Payer: Self-pay | Admitting: Family Medicine

## 2017-03-03 NOTE — Discharge Summary (Signed)
[facility] at [place] NAME: Tabitha Evans    MR#:  [id number]  DATE OF BIRTH:  Apr 03, 1960  DATE OF ADMISSION:  02/25/2017 ADMITTING PHYSICIAN: Hillary Bow, MD  DATE OF DISCHARGE: 02/27/2017 12:29 PM  PRIMARY CARE PHYSICIAN: Valerie Roys, DO   ADMISSION DIAGNOSIS:  Acute GI bleeding [K92.2]  DISCHARGE DIAGNOSIS:  Active Problems:   Lower GI bleed   Blood in stool   Acute gastric ulcer with hemorrhage   Acute posthemorrhagic anemia   SECONDARY DIAGNOSIS:   Past Medical History:  Diagnosis Date  . Acid reflux   . Colon polyp   . Frequent headaches   . Hypertension      ADMITTING HISTORY  HISTORY OF PRESENT ILLNESS:  Tabitha Evans  is a 57 y.o. female with a known history of hypertension, GERD, colon polyps who takes Tylenol with aspirin daily for headaches presents to the emergency room complaining of painless bloody stool.  4 episodes.  No melena.  Feels dizzy every time she sits or stands up.  Hemoglobin 11.2.  Tachycardic into the 120s.  Blood pressure stable.  4 patient had colonoscopy many years back which was routine and had colon polyps.  No prior history of GI bleed.  Patient is being admitted for GI bleed which seems to be likely lower with anemia. Total of 4 episodes of bloody stool.     HOSPITAL COURSE:   *Upper GI bleed due to gastric ulcer with oozing.  EGD done and cauterized.  Bleeding stopped.  No need for blood transfusion.  Started on PPIs, iron.  Tolerated liquids.  Discussed with Dr. Verl Blalock of GI.  Discharge home to follow-up with GI in 2-3 weeks.  Advised to return if any further bleeding or worsening symptoms.  Other comorbidities remained stable.  CONSULTS OBTAINED:  Treatment Team:  Virgel Manifold, MD  DRUG ALLERGIES:  No Known Allergies  DISCHARGE MEDICATIONS:   Allergies as of 02/27/2017   No Known Allergies     Medication List    STOP taking these medications    aspirin-acetaminophen-caffeine 250-250-65 MG tablet Commonly known as:  EXCEDRIN MIGRAINE     TAKE these medications   ferrous sulfate 325 (65 FE) MG tablet Take 325 mg by mouth daily with breakfast.   metFORMIN 500 MG 24 hr tablet Commonly known as:  GLUCOPHAGE-XR TAKE ONE TABLET BY MOUTH ONCE DAILY WITH BREAKFAST   omeprazole 40 MG capsule Commonly known as:  PRILOSEC Take 1 capsule (40 mg total) by mouth 2 (two) times daily before a meal. What changed:    medication strength  how much to take  when to take this   sertraline 100 MG tablet Commonly known as:  ZOLOFT Take 1 tablet (100 mg total) by mouth daily.   triamterene-hydrochlorothiazide 37.5-25 MG tablet Commonly known as:  MAXZIDE-25 Take 1 tablet by mouth daily.       Today   VITAL SIGNS:  Blood pressure 133/75, pulse 91, temperature 98 F (36.7 C), temperature source Oral, resp. rate 18, height 5\' 5"  (1.651 m), weight 89 kg (196 lb 1.6 oz), last menstrual period 09/01/2014, SpO2 99 %.  I/O:  No intake or output data in the 24 hours ending 03/03/17 1532  PHYSICAL EXAMINATION:  Physical Exam  GENERAL:  57 y.o.-year-old patient lying in the bed with no acute distress.  LUNGS: Normal breath sounds bilaterally, no wheezing, rales,rhonchi or crepitation. No use of accessory muscles of respiration.  CARDIOVASCULAR: S1, S2  normal. No murmurs, rubs, or gallops.  ABDOMEN: Soft, non-tender, non-distended. Bowel sounds present. No organomegaly or mass.  NEUROLOGIC: Moves all 4 extremities. PSYCHIATRIC: The patient is alert and oriented x 3.  SKIN: No obvious rash, lesion, or ulcer.   DATA REVIEW:   CBC Recent Labs  Lab 02/27/17 0440  WBC 5.8  HGB 9.0*  HCT 25.8*  PLT 238    Chemistries  Recent Labs  Lab 02/25/17 1255  02/27/17 0440  NA 137   < > 140  K 3.5   < > 3.2*  CL 102   < > 106  CO2 24   < > 28  GLUCOSE 126*   < > 166*  BUN 19   < > 9  CREATININE 0.68   < > 0.72  CALCIUM 9.2   < >  8.6*  AST 52*  --   --   ALT 46  --   --   ALKPHOS 72  --   --   BILITOT 0.7  --   --    < > = values in this interval not displayed.    Cardiac Enzymes No results for input(s): TROPONINI in the last 168 hours.  Microbiology Results  Results for orders placed or performed in visit on 09/01/16  Microscopic Examination     Status: None   Collection Time: 09/01/16 12:00 AM  Result Value Ref Range Status   WBC, UA 0-5 0 - 5 /hpf Final   RBC, UA None seen 0 - 2 /hpf Final   Epithelial Cells (non renal) 0-10 0 - 10 /hpf Final   Renal Epithel, UA None seen None seen /hpf Final   Casts None seen None seen /lpf Final   Cast Type None seen N/A Final   Crystals None seen N/A Final   Crystal Type None seen N/A Final   Mucus, UA None seen Not Estab. Final   Bacteria, UA None seen None seen/Few Final   Yeast, UA None seen None seen Final   Trichomonas, UA None seen None seen Final    RADIOLOGY:  No results found.  Follow up with PCP in 1 week.  Management plans discussed with the patient, family and they are in agreement.  CODE STATUS:  Code Status History    Date Active Date Inactive Code Status Order ID Comments User Context   02/25/2017 14:29 02/27/2017 15:34 Full Code [id number]  Hillary Bow, MD ED    Advance Directive Documentation     Most Recent Value  Type of Advance Directive  Healthcare Power of Attorney  Pre-existing out of facility DNR order (yellow form or pink MOST form)  No data  "MOST" Form in Place?  No data      TOTAL TIME TAKING CARE OF THIS PATIENT ON DAY OF DISCHARGE: more than 30 minutes.   Neita Carp M.D on 03/03/2017 at 3:32 PM  Between 7am to 6pm - Pager - [phone]  After 6pm go to [email or web address] - password [email or web address] [facility]  Office  [phone]  CC: Primary care physician; Valerie Roys, DO  Note: This dictation was prepared with Dragon dictation along with smaller phrase technology. Any transcriptional errors  that result from this process are unintentional.

## 2017-03-04 ENCOUNTER — Ambulatory Visit: Payer: BLUE CROSS/BLUE SHIELD | Admitting: Family Medicine

## 2017-03-04 ENCOUNTER — Telehealth: Payer: Self-pay | Admitting: Gastroenterology

## 2017-03-04 ENCOUNTER — Encounter: Payer: Self-pay | Admitting: Family Medicine

## 2017-03-04 VITALS — BP 124/85 | HR 91 | Temp 98.6°F | Ht 65.2 in | Wt 195.3 lb

## 2017-03-04 DIAGNOSIS — Z0001 Encounter for general adult medical examination with abnormal findings: Secondary | ICD-10-CM | POA: Diagnosis not present

## 2017-03-04 DIAGNOSIS — Z0000 Encounter for general adult medical examination without abnormal findings: Secondary | ICD-10-CM

## 2017-03-04 DIAGNOSIS — D62 Acute posthemorrhagic anemia: Secondary | ICD-10-CM | POA: Diagnosis not present

## 2017-03-04 DIAGNOSIS — Z1239 Encounter for other screening for malignant neoplasm of breast: Secondary | ICD-10-CM

## 2017-03-04 DIAGNOSIS — F411 Generalized anxiety disorder: Secondary | ICD-10-CM | POA: Diagnosis not present

## 2017-03-04 DIAGNOSIS — Z124 Encounter for screening for malignant neoplasm of cervix: Secondary | ICD-10-CM

## 2017-03-04 DIAGNOSIS — E1122 Type 2 diabetes mellitus with diabetic chronic kidney disease: Secondary | ICD-10-CM

## 2017-03-04 DIAGNOSIS — I129 Hypertensive chronic kidney disease with stage 1 through stage 4 chronic kidney disease, or unspecified chronic kidney disease: Secondary | ICD-10-CM | POA: Diagnosis not present

## 2017-03-04 DIAGNOSIS — N183 Chronic kidney disease, stage 3 unspecified: Secondary | ICD-10-CM

## 2017-03-04 DIAGNOSIS — D509 Iron deficiency anemia, unspecified: Secondary | ICD-10-CM

## 2017-03-04 DIAGNOSIS — K250 Acute gastric ulcer with hemorrhage: Secondary | ICD-10-CM

## 2017-03-04 DIAGNOSIS — Z1159 Encounter for screening for other viral diseases: Secondary | ICD-10-CM

## 2017-03-04 DIAGNOSIS — Z23 Encounter for immunization: Secondary | ICD-10-CM

## 2017-03-04 LAB — UA/M W/RFLX CULTURE, ROUTINE
Bilirubin, UA: NEGATIVE
Glucose, UA: NEGATIVE
LEUKOCYTES UA: NEGATIVE
Nitrite, UA: NEGATIVE
PH UA: 6 (ref 5.0–7.5)
RBC, UA: NEGATIVE
Specific Gravity, UA: 1.02 (ref 1.005–1.030)
UUROB: 0.2 mg/dL (ref 0.2–1.0)

## 2017-03-04 LAB — MICROSCOPIC EXAMINATION: BACTERIA UA: NONE SEEN

## 2017-03-04 LAB — BAYER DCA HB A1C WAIVED: HB A1C: 6.3 % (ref <7.0)

## 2017-03-04 LAB — MICROALBUMIN, URINE WAIVED
CREATININE, URINE WAIVED: 200 mg/dL (ref 10–300)
MICROALB, UR WAIVED: 80 mg/L — AB (ref 0–19)

## 2017-03-04 MED ORDER — SERTRALINE HCL 100 MG PO TABS: 100.0000 mg | ORAL_TABLET | Freq: Every day | ORAL | 1 refills | Status: DC

## 2017-03-04 MED ORDER — TRIAMTERENE-HCTZ 37.5-25 MG PO TABS: 1.0000 | ORAL_TABLET | Freq: Every day | ORAL | 1 refills | Status: DC

## 2017-03-04 MED ORDER — METFORMIN HCL ER 500 MG PO TB24: ORAL_TABLET | ORAL | 1 refills | Status: DC

## 2017-03-04 NOTE — Assessment & Plan Note (Signed)
Following with GI. Continue protonix. Call with any concerns. Rechecking iron today.

## 2017-03-04 NOTE — Assessment & Plan Note (Signed)
Under good control with A1c of 6.3. Continue current regimen. Continue to monitor. Call with any concerns.  

## 2017-03-04 NOTE — Assessment & Plan Note (Signed)
Under good control. Continue to monitor. Continue current regimen. Call with any concerns.

## 2017-03-04 NOTE — Assessment & Plan Note (Signed)
Rechecking iron and CBC today. Await results. Continue iron. Call with any concerns.

## 2017-03-04 NOTE — Progress Notes (Signed)
BP 124/85 (BP Location: Left Arm, Patient Position: Sitting, Cuff Size: Large)   Pulse 91   Temp 98.6 F (37 C)   Ht 5' 5.2" (1.656 m)   Wt 195 lb 5 oz (88.6 kg)   LMP 09/01/2014 (Approximate)   SpO2 98%   BMI 32.30 kg/m    Subjective:    Patient ID: Tabitha Evans, female    DOB: Mar 11, 1960, 57 y.o.   MRN: [id number]  HPI: Tabitha Evans is a 57 y.o. female presenting on 03/04/2017 for comprehensive medical examination. Current medical complaints include:  ER FOLLOW UP Time since discharge: 6 days Hospital/facility: [facility] Diagnosis: GI bleed Procedures/tests: EGD Consultants: GI New medications: protonix  Discharge instructions:  Follow up with GI by phone in 2 weeks, come here Status: better  ANEMIA Anemia status: exacerbated Etiology of anemia: Duration of anemia treatment:  Compliance with treatment: excellent compliance Iron supplementation side effects: no Severity of anemia: moderate Fatigue: yes Decreased exercise tolerance: no  Dyspnea on exertion: no Palpitations: no Bleeding: yes Pica: no  HYPERTENSION Hypertension status: controlled  Satisfied with current treatment? yes Duration of hypertension: chronic BP monitoring frequency:  not checking BP medication side effects:  no Medication compliance: excellent compliance Previous BP meds: triamterine-hctz Aspirin: no Recurrent headaches: yes Visual changes: no Palpitations: no Dyspnea: no Chest pain: no Lower extremity edema: no Dizzy/lightheaded: no  DIABETES Hypoglycemic episodes:no Polydipsia/polyuria: yes Visual disturbance: no Chest pain: no Paresthesias: no Glucose Monitoring: no Taking Insulin?: no Blood Pressure Monitoring: not checking Retinal Examination: Not up to Date Foot Exam: Up to Date Diabetic Education: Completed Pneumovax: Up to Date Influenza: Up to Date Aspirin: yes  Menopausal Symptoms: vaginal driness, hot flashes  Depression Screen done today and results  listed below:  Depression screen [facility] 2/9 03/04/2017 03/04/2017 10/08/2015 08/22/2015 06/18/2015  Decreased Interest 0 0 0 1 0  Down, Depressed, Hopeless 0 0 0 0 0  PHQ - 2 Score 0 0 0 1 0  Altered sleeping 3 3 3 3  -  Tired, decreased energy 2 2 1 1  -  Change in appetite 2 2 2 1  -  Feeling bad or failure about yourself  0 0 0 0 -  Trouble concentrating 2 2 3 2  -  Moving slowly or fidgety/restless 2 2 0 0 -  Suicidal thoughts 0 0 0 0 -  PHQ-9 Score 11 11 9 8  -  Difficult doing work/chores Somewhat difficult Somewhat difficult - Somewhat difficult -   Past Medical History:  Past Medical History:  Diagnosis Date  . Acid reflux   . Colon polyp   . Frequent headaches   . Hypertension     Surgical History:  Past Surgical History:  Procedure Laterality Date  . COLONOSCOPY    . ESOPHAGOGASTRODUODENOSCOPY N/A 02/26/2017   Procedure: ESOPHAGOGASTRODUODENOSCOPY (EGD);  Surgeon: Virgel Manifold, MD;  Location: [facility] ENDOSCOPY;  Service: Endoscopy;  Laterality: N/A;  . UPPER GASTROINTESTINAL ENDOSCOPY      Medications:  Current Outpatient Medications on File Prior to Visit  Medication Sig  . ferrous sulfate 325 (65 FE) MG tablet Take 325 mg by mouth daily with breakfast.  . metFORMIN (GLUCOPHAGE-XR) 500 MG 24 hr tablet TAKE 1 TABLET BY MOUTH ONCE DAILY WITH BREAKFAST  . omeprazole (PRILOSEC) 40 MG capsule Take 1 capsule (40 mg total) by mouth 2 (two) times daily before a meal.  . sertraline (ZOLOFT) 100 MG tablet Take 1 tablet (100 mg total) by mouth daily.  Marland Kitchen triamterene-hydrochlorothiazide (MAXZIDE-25)  37.5-25 MG tablet Take 1 tablet by mouth daily.   No current facility-administered medications on file prior to visit.     Allergies:  No Known Allergies  Social History:  Social History   Socioeconomic History  . Marital status: Single    Spouse name: Not on file  . Number of children: Not on file  . Years of education: Not on file  . Highest education level: Not on file    Social Needs  . Financial resource strain: Not on file  . Food insecurity - worry: Not on file  . Food insecurity - inability: Not on file  . Transportation needs - medical: Not on file  . Transportation needs - non-medical: Not on file  Occupational History  . Not on file  Tobacco Use  . Smoking status: Never Smoker  . Smokeless tobacco: Never Used  Substance and Sexual Activity  . Alcohol use: Yes    Alcohol/week: 0.0 oz    Comment: on the weekends  . Drug use: No  . Sexual activity: No  Other Topics Concern  . Not on file  Social History Narrative  . Not on file   Social History   Tobacco Use  Smoking Status Never Smoker  Smokeless Tobacco Never Used   Social History   Substance and Sexual Activity  Alcohol Use Yes  . Alcohol/week: 0.0 oz   Comment: on the weekends    Family History:  Family History  Problem Relation Age of Onset  . Hypertension Mother   . Hyperlipidemia Mother   . Glaucoma Mother   . Liver disease Father   . Cancer Father   . Glaucoma Sister   . Hypertension Brother   . Dementia Maternal Grandmother   . Glaucoma Sister     Past medical history, surgical history, medications, allergies, family history and social history reviewed with patient today and changes made to appropriate areas of the chart.   Review of Systems  Constitutional: Positive for malaise/fatigue. Negative for chills, diaphoresis, fever and weight loss.  HENT: Negative.   Eyes: Negative.   Respiratory: Negative.   Cardiovascular: Negative.   Gastrointestinal: Positive for blood in stool. Negative for abdominal pain, constipation, diarrhea, heartburn, melena, nausea and vomiting.  Genitourinary: Positive for dysuria. Negative for flank pain, frequency, hematuria and urgency.  Musculoskeletal: Negative.   Skin: Negative.   Neurological: Positive for headaches. Negative for dizziness, tingling, tremors, sensory change, speech change, focal weakness, seizures, loss of  consciousness and weakness.  Endo/Heme/Allergies: Positive for polydipsia. Negative for environmental allergies. Does not bruise/bleed easily.  Psychiatric/Behavioral: Negative.     All other ROS negative except what is listed above and in the HPI.      Objective:    BP 124/85 (BP Location: Left Arm, Patient Position: Sitting, Cuff Size: Large)   Pulse 91   Temp 98.6 F (37 C)   Ht 5' 5.2" (1.656 m)   Wt 195 lb 5 oz (88.6 kg)   LMP 09/01/2014 (Approximate)   SpO2 98%   BMI 32.30 kg/m   Wt Readings from Last 3 Encounters:  03/04/17 195 lb 5 oz (88.6 kg)  02/27/17 196 lb 1.6 oz (89 kg)  09/01/16 188 lb 4.8 oz (85.4 kg)    Physical Exam  Constitutional: She is oriented to person, place, and time. She appears well-developed and well-nourished. No distress.  HENT:  Head: Normocephalic and atraumatic.  Right Ear: Hearing, tympanic membrane, external ear and ear canal normal.  Left Ear: Hearing, tympanic  membrane, external ear and ear canal normal.  Nose: Nose normal.  Mouth/Throat: Uvula is midline, oropharynx is clear and moist and mucous membranes are normal.  Eyes: Conjunctivae, EOM and lids are normal. Pupils are equal, round, and reactive to light. Right eye exhibits no discharge. Left eye exhibits no discharge. No scleral icterus.  Neck: Normal range of motion. Neck supple. No JVD present. No tracheal deviation present. No thyromegaly present.  Cardiovascular: Normal rate, regular rhythm, normal heart sounds and intact distal pulses. Exam reveals no gallop and no friction rub.  No murmur heard. Pulmonary/Chest: Effort normal and breath sounds normal. No stridor. No respiratory distress. She has no wheezes. She has no rales. She exhibits no tenderness.  Abdominal: Soft. Bowel sounds are normal. She exhibits no distension and no mass. There is no tenderness. There is no rebound and no guarding. Hernia confirmed negative in the right inguinal area and confirmed negative in the left  inguinal area.  Genitourinary: Vagina normal and uterus normal. No labial fusion. There is no rash, tenderness, lesion or injury on the right labia. There is no rash, tenderness, lesion or injury on the left labia. Uterus is not deviated, not enlarged, not fixed and not tender. Cervix exhibits no motion tenderness, no discharge and no friability. Right adnexum displays no mass, no tenderness and no fullness. Left adnexum displays no mass, no tenderness and no fullness. No erythema, tenderness or bleeding in the vagina. No foreign body in the vagina. No signs of injury around the vagina. No vaginal discharge found.  Musculoskeletal: Normal range of motion. She exhibits no edema, tenderness or deformity.  Lymphadenopathy:    She has no cervical adenopathy.  Neurological: She is alert and oriented to person, place, and time. She has normal reflexes. She displays normal reflexes. No cranial nerve deficit. She exhibits normal muscle tone. Coordination normal.  Skin: Skin is warm, dry and intact. No rash noted. She is not diaphoretic. No erythema. No pallor.  Psychiatric: She has a normal mood and affect. Her speech is normal and behavior is normal. Judgment and thought content normal. Cognition and memory are normal.  Nursing note and vitals reviewed.   Results for orders placed or performed in visit on 03/04/17  Microscopic Examination  Result Value Ref Range   WBC, UA 0-5 0 - 5 /hpf   RBC, UA 0-2 0 - 2 /hpf   Epithelial Cells (non renal) 0-10 0 - 10 /hpf   Bacteria, UA None seen None seen/Few  Bayer DCA Hb A1c Waived  Result Value Ref Range   Bayer DCA Hb A1c Waived 6.3 <7.0 %  Microalbumin, Urine Waived  Result Value Ref Range   Microalb, Ur Waived 80 (H) 0 - 19 mg/L   Creatinine, Urine Waived 200 10 - 300 mg/dL   Microalb/Creat Ratio 30-300 (H) <30 mg/g  UA/M w/rflx Culture, Routine  Result Value Ref Range   Specific Gravity, UA 1.020 1.005 - 1.030   pH, UA 6.0 5.0 - 7.5   Color, UA  Orange Yellow   Appearance Ur Clear Clear   Leukocytes, UA Negative Negative   Protein, UA 1+ (A) Negative/Trace   Glucose, UA Negative Negative   Ketones, UA Trace (A) Negative   RBC, UA Negative Negative   Bilirubin, UA Negative Negative   Urobilinogen, Ur 0.2 0.2 - 1.0 mg/dL   Nitrite, UA Negative Negative   Microscopic Examination See below:       Assessment & Plan:   Problem List Items Addressed  This Visit      Digestive   Acute gastric ulcer with hemorrhage    Following with GI. Continue protonix. Call with any concerns. Rechecking iron today.        Endocrine   Controlled type 2 diabetes with renal manifestation ([place])    Under good control with A1c of 6.3. Continue current regimen. Continue to monitor. Call with any concerns.       Relevant Orders   Bayer DCA Hb A1c Waived (Completed)   CBC with Differential/Platelet   Comprehensive metabolic panel   Microalbumin, Urine Waived (Completed)   UA/M w/rflx Culture, Routine (Completed)     Genitourinary   Benign hypertensive renal disease    Under good control. Continue to monitor. Continue current regimen. Call with any concerns.       Relevant Orders   CBC with Differential/Platelet   Comprehensive metabolic panel   UA/M w/rflx Culture, Routine (Completed)     Other   Iron deficiency anemia    Rechecking iron and CBC today. Await results. Continue iron. Call with any concerns.       Relevant Orders   CBC with Differential/Platelet   Comprehensive metabolic panel   Iron and TIBC   UA/M w/rflx Culture, Routine (Completed)   Anxiety disorder    Stable. Feeling good. Wants to stay on current dose of zoloft and possibly discuss coming off of it in 6 months. Call with any concerns.       Relevant Orders   CBC with Differential/Platelet   Comprehensive metabolic panel   TSH   UA/M w/rflx Culture, Routine (Completed)   Acute posthemorrhagic anemia    Rechecking iron and CBC today. Await results. Continue  iron. Call with any concerns.        Other Visit Diagnoses    Routine general medical examination at a health care facility    -  Primary   Vaccines updated. Screening labs checked today. Colonoscopy up to date. Pap done today. Mammogram odered. Call with any concerns.    Relevant Orders   Bayer DCA Hb A1c Waived (Completed)   CBC with Differential/Platelet   Comprehensive metabolic panel   Iron and TIBC   Lipid Panel w/o Chol/HDL Ratio   Microalbumin, Urine Waived (Completed)   TSH   UA/M w/rflx Culture, Routine (Completed)   Immunization due       Flu and pneumovax given today.   Relevant Orders   Flu Vaccine QUAD 6+ mos PF IM (Fluarix Quad PF) (Completed)   Pneumococcal polysaccharide vaccine 23-valent greater than or equal to 2yo subcutaneous/IM (Completed)   Screening for breast cancer       Mammogram ordered today.   Relevant Orders   MM DIGITAL SCREENING BILATERAL   Encounter for hepatitis C screening test for low risk patient       Hep C drawn today. Await results.    Relevant Orders   Hepatitis C antibody   Screening for cervical cancer       Pap done today.   Relevant Orders   IGP, Aptima HPV, rfx 16/18,45       Follow up plan: Return in about 6 months (around 09/01/2017) for follow up.   LABORATORY TESTING:  - Pap smear: pap done  IMMUNIZATIONS:   - Tdap: Tetanus vaccination status reviewed: last tetanus booster within 10 years. - Influenza: Administered today - Pneumovax: Administered today - Prevnar: Not applicable - Zostavax vaccine: Not applicable  SCREENING: -Mammogram: Ordered today  - Colonoscopy: Up to  date   PATIENT COUNSELING:   Advised to take 1 mg of folate supplement per day if capable of pregnancy.   Sexuality: Discussed sexually transmitted diseases, partner selection, use of condoms, avoidance of unintended pregnancy  and contraceptive alternatives.   Advised to avoid cigarette smoking.  I discussed with the patient that most  people either abstain from alcohol or drink within safe limits (<=14/week and <=4 drinks/occasion for males, <=7/weeks and <= 3 drinks/occasion for females) and that the risk for alcohol disorders and other health effects rises proportionally with the number of drinks per week and how often a drinker exceeds daily limits.  Discussed cessation/primary prevention of drug use and availability of treatment for abuse.   Diet: Encouraged to adjust caloric intake to maintain  or achieve ideal body weight, to reduce intake of dietary saturated fat and total fat, to limit sodium intake by avoiding high sodium foods and not adding table salt, and to maintain adequate dietary potassium and calcium preferably from fresh fruits, vegetables, and low-fat dairy products.    stressed the importance of regular exercise  Injury prevention: Discussed safety belts, safety helmets, smoke detector, smoking near bedding or upholstery.   Dental health: Discussed importance of regular tooth brushing, flossing, and dental visits.    NEXT PREVENTATIVE PHYSICAL DUE IN 1 YEAR. Return in about 6 months (around 09/01/2017) for follow up.

## 2017-03-04 NOTE — Addendum Note (Signed)
Addended by: Valerie Roys on: 03/04/2017 10:33 PM   Modules accepted: Orders

## 2017-03-04 NOTE — Assessment & Plan Note (Signed)
Stable. Feeling good. Wants to stay on current dose of zoloft and possibly discuss coming off of it in 6 months. Call with any concerns.

## 2017-03-04 NOTE — Telephone Encounter (Signed)
Left voice message for patient to call and schedule a 4 week follow up with Dr. Bonna Gains.

## 2017-03-05 LAB — COMPREHENSIVE METABOLIC PANEL
A/G RATIO: 1.6 (ref 1.2–2.2)
ALT: 35 IU/L — AB (ref 0–32)
AST: 47 IU/L — AB (ref 0–40)
Albumin: 4.6 g/dL (ref 3.5–5.5)
Alkaline Phosphatase: 88 IU/L (ref 39–117)
BUN/Creatinine Ratio: 18 (ref 9–23)
BUN: 12 mg/dL (ref 6–24)
Bilirubin Total: 0.2 mg/dL (ref 0.0–1.2)
CALCIUM: 9.7 mg/dL (ref 8.7–10.2)
CO2: 24 mmol/L (ref 20–29)
Chloride: 100 mmol/L (ref 96–106)
Creatinine, Ser: 0.66 mg/dL (ref 0.57–1.00)
GFR calc Af Amer: 116 mL/min/{1.73_m2} (ref >59)
GFR, EST NON AFRICAN AMERICAN: 100 mL/min/{1.73_m2} (ref >59)
GLUCOSE: 113 mg/dL — AB (ref 65–99)
Globulin, Total: 2.9 g/dL (ref 1.5–4.5)
POTASSIUM: 4.3 mmol/L (ref 3.5–5.2)
Sodium: 140 mmol/L (ref 134–144)
TOTAL PROTEIN: 7.5 g/dL (ref 6.0–8.5)

## 2017-03-05 LAB — LIPID PANEL W/O CHOL/HDL RATIO
Cholesterol, Total: 207 mg/dL — ABNORMAL HIGH (ref 100–199)
HDL: 48 mg/dL (ref >39)
LDL Calculated: 123 mg/dL — ABNORMAL HIGH (ref 0–99)
TRIGLYCERIDES: 182 mg/dL — AB (ref 0–149)
VLDL Cholesterol Cal: 36 mg/dL (ref 5–40)

## 2017-03-05 LAB — IRON AND TIBC
Iron Saturation: 15 % (ref 15–55)
Iron: 50 ug/dL (ref 27–159)
TIBC: 334 ug/dL (ref 250–450)
UIBC: 284 ug/dL (ref 131–425)

## 2017-03-05 LAB — HEPATITIS C ANTIBODY

## 2017-03-05 LAB — CBC WITH DIFFERENTIAL/PLATELET
BASOS ABS: 0 10*3/uL (ref 0.0–0.2)
Basos: 0 %
EOS (ABSOLUTE): 0.2 10*3/uL (ref 0.0–0.4)
Eos: 2 %
Hematocrit: 31.3 % — ABNORMAL LOW (ref 34.0–46.6)
Hemoglobin: 10.5 g/dL — ABNORMAL LOW (ref 11.1–15.9)
IMMATURE GRANS (ABS): 0 10*3/uL (ref 0.0–0.1)
IMMATURE GRANULOCYTES: 1 %
LYMPHS: 28 %
Lymphocytes Absolute: 2.1 10*3/uL (ref 0.7–3.1)
MCH: 28.5 pg (ref 26.6–33.0)
MCHC: 33.5 g/dL (ref 31.5–35.7)
MCV: 85 fL (ref 79–97)
MONOS ABS: 0.6 10*3/uL (ref 0.1–0.9)
Monocytes: 8 %
NEUTROS PCT: 61 %
Neutrophils Absolute: 4.7 10*3/uL (ref 1.4–7.0)
PLATELETS: 404 10*3/uL — AB (ref 150–379)
RBC: 3.68 x10E6/uL — AB (ref 3.77–5.28)
RDW: 15.2 % (ref 12.3–15.4)
WBC: 7.7 10*3/uL (ref 3.4–10.8)

## 2017-03-05 LAB — TSH: TSH: 2.74 u[IU]/mL (ref 0.450–4.500)

## 2017-03-06 LAB — IGP, APTIMA HPV, RFX 16/18,45
HPV APTIMA: NEGATIVE
PAP Smear Comment: 0

## 2017-03-30 ENCOUNTER — Other Ambulatory Visit
Admission: RE | Admit: 2017-03-30 | Discharge: 2017-03-30 | Disposition: A | Discharge status: Home / Self Care | Payer: BLUE CROSS/BLUE SHIELD | Source: Ambulatory Visit | Attending: Gastroenterology | Admitting: Gastroenterology

## 2017-03-30 ENCOUNTER — Ambulatory Visit: Payer: BLUE CROSS/BLUE SHIELD | Admitting: Gastroenterology

## 2017-03-30 ENCOUNTER — Encounter: Payer: Self-pay | Admitting: Gastroenterology

## 2017-03-30 VITALS — BP 127/84 | HR 86 | Temp 98.2°F | Ht 62.0 in | Wt 194.0 lb

## 2017-03-30 DIAGNOSIS — K250 Acute gastric ulcer with hemorrhage: Secondary | ICD-10-CM

## 2017-03-30 DIAGNOSIS — T39395A Adverse effect of other nonsteroidal anti-inflammatory drugs [NSAID], initial encounter: Secondary | ICD-10-CM | POA: Diagnosis not present

## 2017-03-30 DIAGNOSIS — K259 Gastric ulcer, unspecified as acute or chronic, without hemorrhage or perforation: Secondary | ICD-10-CM | POA: Diagnosis not present

## 2017-03-30 NOTE — Progress Notes (Signed)
[name], MD [address]  [place]  [place], [place] [address]  Main: [phone]  Fax: [phone]   Primary Care Physician: Valerie Roys, DO  Primary Gastroenterologist:  Dr. [name]  Chief Complaint  Patient presents with  . follow up Rectal Bleed and EGD    HPI: Tabitha Evans is a 57 y.o. female here for hospital follow-up for hematochezia in the setting of Excedrin use and patient discharged for the same in February 27, 2017.  She underwent an EGD during the hospital admission 1 oozing gastric ulcer was seen and clips were placed.  She has not had any further episodes of hematochezia and has not been on any NSAIDs.  Is currently on PPI twice daily.  CBC has improved since admission, and hemoglobin was 10.5 on January 24.  Denies any abdominal pain, no altered bowel habits, no dysphagia, no nausea vomiting, no weight loss.  As written in my hospital consult note: "On review of provation records she had a colonoscopy in 2007 for iron deficiency anemia which showed patchy nonbleeding angiectasias, diverticulosis, but no source of her iron deficiency.  EGD was done and was normal.  Small bowel capsule was recommended but it is unclear if this was done."  Current Outpatient Medications  Medication Sig Dispense Refill  . ferrous sulfate 325 (65 FE) MG tablet Take 325 mg by mouth daily with breakfast.    . metFORMIN (GLUCOPHAGE-XR) 500 MG 24 hr tablet TAKE 1 TABLET BY MOUTH ONCE DAILY WITH BREAKFAST 90 tablet 1  . omeprazole (PRILOSEC) 40 MG capsule Take 1 capsule (40 mg total) by mouth 2 (two) times daily before a meal. 60 capsule 0  . sertraline (ZOLOFT) 100 MG tablet Take 1 tablet (100 mg total) by mouth daily. 90 tablet 1  . triamterene-hydrochlorothiazide (MAXZIDE-25) 37.5-25 MG tablet Take 1 tablet by mouth daily. 90 tablet 1   No current facility-administered medications for this visit.     Allergies as of 03/30/2017  . (No Known Allergies)     ROS:  General: Negative for anorexia, weight loss, fever, chills, fatigue, weakness. ENT: Negative for hoarseness, difficulty swallowing , nasal congestion. CV: Negative for chest pain, angina, palpitations, dyspnea on exertion, peripheral edema.  Respiratory: Negative for dyspnea at rest, dyspnea on exertion, cough, sputum, wheezing.  GI: See history of present illness. GU:  Negative for dysuria, hematuria, urinary incontinence, urinary frequency, nocturnal urination.  Endo: Negative for unusual weight change.    Physical Examination:   BP 127/84   Pulse 86   Temp 98.2 F (36.8 C)   Ht 5\' 2"  (1.575 m)   Wt 194 lb (88 kg)   LMP 09/01/2014 (Approximate)   BMI 35.48 kg/m   General: Well-nourished, well-developed in no acute distress.  Eyes: No icterus. Conjunctivae pink. Mouth: Oropharyngeal mucosa moist and pink , no lesions erythema or exudate. Abdomen: Bowel sounds are normal, nontender, nondistended, no hepatosplenomegaly or masses, no abdominal bruits or hernia , no rebound or guarding.   Extremities: No lower extremity edema. No clubbing or deformities. Neuro: Alert and oriented x 3.  Grossly intact. Skin: Warm and dry, no jaundice.   Psych: Alert and cooperative, normal mood and affect.   Labs: CMP     Component Value Date/Time   NA 140 03/04/2017 1027   NA 136 11/28/2013 1745   K 4.3 03/04/2017 1027   K 3.1 (L) 11/28/2013 1745   CL 100 03/04/2017 1027   CL 100 11/28/2013 1745  CO2 24 03/04/2017 1027   CO2 26 11/28/2013 1745   GLUCOSE 113 (H) 03/04/2017 1027   GLUCOSE 166 (H) 02/27/2017 0440   GLUCOSE 118 (H) 11/28/2013 1745   BUN 12 03/04/2017 1027   BUN 13 11/28/2013 1745   CREATININE 0.66 03/04/2017 1027   CREATININE 0.84 11/28/2013 1745   CALCIUM 9.7 03/04/2017 1027   CALCIUM 9.0 11/28/2013 1745   PROT 7.5 03/04/2017 1027   ALBUMIN 4.6 03/04/2017 1027   AST 47 (H) 03/04/2017 1027   ALT 35 (H) 03/04/2017 1027   ALKPHOS 88 03/04/2017 1027    BILITOT 0.2 03/04/2017 1027   GFRNONAA 100 03/04/2017 1027   GFRNONAA >60 11/28/2013 1745   GFRAA 116 03/04/2017 1027   GFRAA >60 11/28/2013 1745   Lab Results  Component Value Date   WBC 7.7 03/04/2017   HGB 10.5 (L) 03/04/2017   HCT 31.3 (L) 03/04/2017   MCV 85 03/04/2017   PLT 404 (H) 03/04/2017    Imaging Studies: No results found.  Assessment and Plan:   Tabitha Evans is a 57 y.o. y/o female here for hospital follow-up of hematochezia and found to have one gastric ulcer that was oozing and was treated with clips with no further hematochezia and no further NSAID use  As biopsies could not be performed during the EGD, due to her GI bleeding, a repeat EGD is indicated to assess for ulcer healing and for biopsies to rule out H. pylori This is recommended to be in 12 weeks from the index EGD.  Will schedule this in 2 months from now Patient to continue PPI twice daily until the Continue to avoid NSAIDs We will obtain H. pylori serology at this time, as stool for H. pylori and breath test can be false negative  We will also schedule for colonoscopy along with the EGD as she is due for CRC screening Last colonoscopy was in 2007 for iron deficiency anemia and nonbleeding angiectasia's were seen  I have discussed alternative options, risks & benefits,  which include, but are not limited to, bleeding, infection, perforation,respiratory complication & drug reaction.  The patient agrees with this plan & written consent will be obtained.     Dr [name]

## 2017-03-30 NOTE — Patient Instructions (Signed)
During today's office visit with Dr. Bonna Gains, Dr. Bonna Gains has recommended the following:  1. Repeat EGD and Colonoscopy in 2 months Call office to schedule. 2. Report to [facility] for labs  H-Pylori Serology. 3. Follow up with Dr. Bonna Gains in 3 months.  Have a great day.  [facility].

## 2017-03-31 LAB — H PYLORI, IGM, IGG, IGA AB: H. Pylogi, Igm Abs: <9 units (ref 0.0–8.9)

## 2017-04-27 ENCOUNTER — Telehealth: Payer: Self-pay | Admitting: Gastroenterology

## 2017-04-27 NOTE — Telephone Encounter (Signed)
Patient would like to know if she should be having a procedure before her appt in May? Please call

## 2017-05-11 ENCOUNTER — Telehealth: Payer: Self-pay

## 2017-05-11 NOTE — Telephone Encounter (Signed)
LVM for pt to contact office to schedule repeat EGD/Colonoscopy due mid April based on 02/19 office visit note.  Thanks Zane Pellecchia

## 2017-05-14 ENCOUNTER — Ambulatory Visit: Payer: BLUE CROSS/BLUE SHIELD | Admitting: Family Medicine

## 2017-05-14 ENCOUNTER — Encounter: Payer: Self-pay | Admitting: Family Medicine

## 2017-05-14 VITALS — BP 138/85 | HR 83 | Temp 98.3°F | Wt 200.1 lb

## 2017-05-14 DIAGNOSIS — R062 Wheezing: Secondary | ICD-10-CM | POA: Diagnosis not present

## 2017-05-14 MED ORDER — ALBUTEROL SULFATE HFA 108 (90 BASE) MCG/ACT IN AERS: 2.0000 | INHALATION_SPRAY | Freq: Four times a day (QID) | RESPIRATORY_TRACT | 2 refills | Status: DC | PRN

## 2017-05-14 MED ORDER — PREDNISONE 10 MG PO TABS: ORAL_TABLET | ORAL | 0 refills | Status: DC

## 2017-05-14 NOTE — Progress Notes (Signed)
BP 138/85 (BP Location: Left Arm, Patient Position: Sitting, Cuff Size: Normal)   Pulse 83   Temp 98.3 F (36.8 C)   Wt 200 lb 1 oz (90.7 kg)   LMP 09/01/2014 (Approximate)   SpO2 99%   BMI 36.59 kg/m    Subjective:    Patient ID: Tabitha Evans, female    DOB: Mar 27, 1960, 57 y.o.   MRN: [id number]  HPI: Tabitha Evans is a 57 y.o. female  Chief Complaint  Patient presents with  . Cough    SOB   UPPER RESPIRATORY TRACT INFECTION- went to the urgent care and got doxycycline and she is no better Duration: 3 weeks, went to urgent care last week, finished doxy Worst symptom: cough Fever: no Cough: yes Shortness of breath: yes Wheezing: no Chest pain: no Chest tightness: no Chest congestion: yes Nasal congestion: no Runny nose: no Post nasal drip: no Sneezing: no Sore throat: no Swollen glands: no Sinus pressure: no Headache: no Face pain: no  Toothache: no Ear pain: no  Ear pressure: no  Eyes red/itching:no Eye drainage/crusting: no  Vomiting: no Rash: no Fatigue: no Sick contacts: yes Strep contacts: no  Context: stable Recurrent sinusitis: no Relief with OTC cold/cough medications: no  Treatments attempted: cold/sinus, mucinex, anti-histamine, pseudoephedrine, cough syrup and antibiotics   Relevant past medical, surgical, family and social history reviewed and updated as indicated. Interim medical history since our last visit reviewed. Allergies and medications reviewed and updated.  Review of Systems  Constitutional: Negative.   HENT: Positive for voice change. Negative for congestion, dental problem, drooling, ear discharge, ear pain, facial swelling, hearing loss, mouth sores, nosebleeds, postnasal drip, rhinorrhea, sinus pressure, sinus pain, sneezing, sore throat, tinnitus and trouble swallowing.   Eyes: Negative.   Respiratory: Positive for cough, shortness of breath and wheezing. Negative for apnea, choking, chest tightness and stridor.     Cardiovascular: Negative.   Psychiatric/Behavioral: Negative.     Per HPI unless specifically indicated above     Objective:    BP 138/85 (BP Location: Left Arm, Patient Position: Sitting, Cuff Size: Normal)   Pulse 83   Temp 98.3 F (36.8 C)   Wt 200 lb 1 oz (90.7 kg)   LMP 09/01/2014 (Approximate)   SpO2 99%   BMI 36.59 kg/m   Wt Readings from Last 3 Encounters:  05/14/17 200 lb 1 oz (90.7 kg)  03/30/17 194 lb (88 kg)  03/04/17 195 lb 5 oz (88.6 kg)    Physical Exam  Constitutional: She is oriented to person, place, and time. She appears well-developed and well-nourished. No distress.  HENT:  Head: Normocephalic and atraumatic.  Right Ear: Hearing and external ear normal.  Left Ear: Hearing and external ear normal.  Nose: Nose normal.  Mouth/Throat: Oropharynx is clear and moist. No oropharyngeal exudate.  Eyes: Pupils are equal, round, and reactive to light. Conjunctivae, EOM and lids are normal. Right eye exhibits no discharge. Left eye exhibits no discharge. No scleral icterus.  Neck: Normal range of motion. Neck supple. No JVD present. No tracheal deviation present. No thyromegaly present.  Cardiovascular: Normal rate, regular rhythm, normal heart sounds and intact distal pulses. Exam reveals no gallop and no friction rub.  No murmur heard. Pulmonary/Chest: Effort normal. No stridor. No respiratory distress. She has wheezes (scattered wheezing). She has no rales. She exhibits no tenderness.  Musculoskeletal: Normal range of motion.  Lymphadenopathy:    She has no cervical adenopathy.  Neurological: She is alert and  oriented to person, place, and time.  Skin: Skin is warm, dry and intact. No rash noted. She is not diaphoretic. No erythema. No pallor.  Psychiatric: She has a normal mood and affect. Her speech is normal and behavior is normal. Judgment and thought content normal. Cognition and memory are normal.  Nursing note and vitals reviewed.   Results for orders  placed or performed during the hospital encounter of 03/30/17  H Pylori, IGM, IGG, IGA AB  Result Value Ref Range   H. Pylogi, Iga Abs <9.0 0.0 - 8.9 units   H. Pylogi, Igm Abs <9.0 0.0 - 8.9 units   H Pylori IgG <0.80 0.00 - 0.79 Index Value      Assessment & Plan:   Problem List Items Addressed This Visit    None    Visit Diagnoses    Wheezing    -  Primary   Likely left over from bronchitis. Will treat with prednisone taper and albuterol. Call with any concerns. Recheck lungs 2 weeks.        Follow up plan: Return in about 2 weeks (around 05/28/2017) for lung recheck.

## 2017-05-25 ENCOUNTER — Telehealth: Payer: Self-pay

## 2017-05-25 MED ORDER — OMEPRAZOLE 40 MG PO CPDR: 40.0000 mg | DELAYED_RELEASE_CAPSULE | Freq: Two times a day (BID) | ORAL | 0 refills | Status: DC

## 2017-05-25 NOTE — Telephone Encounter (Signed)
Omeprazole 40 mg Take one capsule twice daily.

## 2017-06-01 ENCOUNTER — Encounter: Payer: Self-pay | Admitting: Family Medicine

## 2017-06-01 ENCOUNTER — Ambulatory Visit: Payer: BLUE CROSS/BLUE SHIELD | Admitting: Family Medicine

## 2017-06-01 VITALS — BP 127/91 | HR 90 | Temp 97.7°F | Wt 200.6 lb

## 2017-06-01 DIAGNOSIS — E669 Obesity, unspecified: Secondary | ICD-10-CM | POA: Insufficient documentation

## 2017-06-01 DIAGNOSIS — R059 Cough, unspecified: Secondary | ICD-10-CM

## 2017-06-01 DIAGNOSIS — R05 Cough: Secondary | ICD-10-CM | POA: Diagnosis not present

## 2017-06-01 MED ORDER — SUCRALFATE 1 GM/10ML PO SUSP: 1.0000 g | Freq: Three times a day (TID) | ORAL | 0 refills | Status: DC

## 2017-06-01 MED ORDER — FLUTICASONE PROPIONATE 50 MCG/ACT NA SUSP: 2.0000 | Freq: Every day | NASAL | 6 refills | Status: DC

## 2017-06-01 NOTE — Progress Notes (Signed)
BP (!) 127/91 (BP Location: Left Arm, Patient Position: Sitting, Cuff Size: Normal)   Pulse 90   Temp 97.7 F (36.5 C)   Wt 200 lb 9 oz (91 kg)   LMP 09/01/2014 (Approximate)   SpO2 97%   BMI 36.68 kg/m    Subjective:    Patient ID: Tabitha Evans, female    DOB: 1960-09-21, 57 y.o.   MRN: [id number]  HPI: Tabitha Evans is a 57 y.o. female  Chief Complaint  Patient presents with  . lung recheck    patient states that she is still having some chest congestion   Feels a lot better. Still has a cough. Feels like she is only 5% better. Not back to herself at all. No difference with the steroids. Worse with the inhaler. No fevers. No chills. SOB only with coughing. No chest pain. Cough is dry and irritating. Cough keeping her up at night.   WEIGHT GAIN Duration: chronic Previous attempts at weight loss: yes- diet and exercise Complications of obesity: HTN, DM Peak weight: current Weight loss goal: 30lbs Weight loss to date: none Requesting obesity pharmacotherapy: yes Current weight loss supplements/medications: no Previous weight loss supplements/meds: no   Relevant past medical, surgical, family and social history reviewed and updated as indicated. Interim medical history since our last visit reviewed. Allergies and medications reviewed and updated.  Review of Systems  Constitutional: Negative.   HENT: Negative.   Respiratory: Positive for cough. Negative for apnea, choking, chest tightness, shortness of breath, wheezing and stridor.   Cardiovascular: Negative.   Gastrointestinal: Negative for abdominal distention, abdominal pain, anal bleeding, blood in stool, constipation, diarrhea, nausea, rectal pain and vomiting.  Skin: Negative.   Psychiatric/Behavioral: Negative.     Per HPI unless specifically indicated above     Objective:    BP (!) 127/91 (BP Location: Left Arm, Patient Position: Sitting, Cuff Size: Normal)   Pulse 90   Temp 97.7 F (36.5 C)    Wt 200 lb 9 oz (91 kg)   LMP 09/01/2014 (Approximate)   SpO2 97%   BMI 36.68 kg/m   Wt Readings from Last 3 Encounters:  06/01/17 200 lb 9 oz (91 kg)  05/14/17 200 lb 1 oz (90.7 kg)  03/30/17 194 lb (88 kg)    Physical Exam  Constitutional: She is oriented to person, place, and time. She appears well-developed and well-nourished. No distress.  HENT:  Head: Normocephalic and atraumatic.  Right Ear: Hearing and external ear normal.  Left Ear: Hearing and external ear normal.  Nose: Nose normal.  Mouth/Throat: Oropharynx is clear and moist. No oropharyngeal exudate.  Eyes: Pupils are equal, round, and reactive to light. Conjunctivae, EOM and lids are normal. Right eye exhibits no discharge. Left eye exhibits no discharge. No scleral icterus.  Neck: Normal range of motion. Neck supple. No JVD present. No tracheal deviation present. No thyromegaly present.  Cardiovascular: Normal rate, regular rhythm, normal heart sounds and intact distal pulses. Exam reveals no gallop and no friction rub.  No murmur heard. Pulmonary/Chest: Effort normal and breath sounds normal. No stridor. No respiratory distress. She has no wheezes. She has no rales. She exhibits no tenderness.  Musculoskeletal: Normal range of motion.  Lymphadenopathy:    She has no cervical adenopathy.  Neurological: She is alert and oriented to person, place, and time.  Skin: Skin is warm, dry and intact. Capillary refill takes less than 2 seconds. No rash noted. She is not diaphoretic. No erythema. No  pallor.  Psychiatric: She has a normal mood and affect. Her speech is normal and behavior is normal. Judgment and thought content normal. Cognition and memory are normal.    Results for orders placed or performed during the hospital encounter of 03/30/17  H Pylori, IGM, IGG, IGA AB  Result Value Ref Range   H. Pylogi, Iga Abs <9.0 0.0 - 8.9 units   H. Pylogi, Igm Abs <9.0 0.0 - 8.9 units   H Pylori IgG <0.80 0.00 - 0.79 Index Value       Assessment & Plan:   Problem List Items Addressed This Visit      Other   Morbid obesity ([place])    Interested in bypass- discussed risks/benefits. Would like to go to group class to learn more. Will also check on cost of Rx medication. Names given today.      Relevant Orders   Amb Referral to Bariatric Surgery    Other Visit Diagnoses    Cough    -  Primary   Spiro normal. ?post nasal drip vs. LPR. Will add carafate and flonase and recheck by phone in 2 weeks, if not better, may need GI.   Relevant Orders   Spirometry with Graph (Completed)       Follow up plan: Return As scheduled in July.

## 2017-06-01 NOTE — Assessment & Plan Note (Signed)
Interested in bypass- discussed risks/benefits. Would like to go to group class to learn more. Will also check on cost of Rx medication. Names given today.

## 2017-06-01 NOTE — Patient Instructions (Signed)
Saxenda Contrave Belviq

## 2017-06-04 ENCOUNTER — Telehealth: Payer: Self-pay | Admitting: Family Medicine

## 2017-06-04 ENCOUNTER — Encounter: Payer: Self-pay | Admitting: Family Medicine

## 2017-06-04 MED ORDER — SUCRALFATE 1 G PO TABS: 1.0000 g | ORAL_TABLET | Freq: Three times a day (TID) | ORAL | 3 refills | Status: DC

## 2017-06-04 NOTE — Telephone Encounter (Signed)
Medication covered.

## 2017-06-04 NOTE — Telephone Encounter (Signed)
Will try the pill carafate. Please see if they'll cover it

## 2017-06-04 NOTE — Telephone Encounter (Signed)
FYI

## 2017-06-04 NOTE — Telephone Encounter (Signed)
Copied from [place]. Topic: Quick Communication - See Telephone Encounter >> Jun 04, 2017  4:12 PM Aurelio Brash B wrote: CRM for notification. See Telephone encounter for: 06/04/17.    Arial from [place] called to say pt was Recently denied  sucralfate (CARAFATE) 1 GM/10ML suspension    and she faxed the information  to office on 3/15     her contact number is  [phone] option 3

## 2017-06-07 MED ORDER — LORCASERIN HCL 10 MG PO TABS: 1.0000 | ORAL_TABLET | Freq: Two times a day (BID) | ORAL | 2 refills | Status: DC

## 2017-06-07 NOTE — Telephone Encounter (Signed)
Please call in Belviq and make sure Sharyn Lull has a 1 month follow up after starting it. Thanks!

## 2017-06-10 NOTE — Telephone Encounter (Signed)
LMTCO.

## 2017-06-10 NOTE — Telephone Encounter (Signed)
Left message to contact office to schedule procedures. EGD, colonoscopy.

## 2017-06-12 ENCOUNTER — Encounter: Payer: Self-pay | Admitting: Family Medicine

## 2017-06-15 ENCOUNTER — Encounter: Payer: Self-pay | Admitting: Family Medicine

## 2017-06-15 ENCOUNTER — Telehealth: Payer: Self-pay

## 2017-06-15 NOTE — Telephone Encounter (Signed)
PA submitted through Cover my meds for [facility]

## 2017-06-30 NOTE — Telephone Encounter (Signed)
Sent contact letter to patient concerning repeat colonoscopy and EGD.

## 2017-07-01 ENCOUNTER — Ambulatory Visit: Payer: BLUE CROSS/BLUE SHIELD | Admitting: Gastroenterology

## 2017-07-06 ENCOUNTER — Ambulatory Visit: Payer: BLUE CROSS/BLUE SHIELD | Admitting: Gastroenterology

## 2017-07-13 ENCOUNTER — Ambulatory Visit: Payer: BLUE CROSS/BLUE SHIELD | Admitting: Gastroenterology

## 2017-09-02 ENCOUNTER — Ambulatory Visit: Payer: BLUE CROSS/BLUE SHIELD | Admitting: Family Medicine

## 2017-09-02 ENCOUNTER — Encounter: Payer: Self-pay | Admitting: Family Medicine

## 2017-09-02 VITALS — BP 131/88 | HR 77 | Temp 98.5°F | Ht 62.0 in | Wt 197.5 lb

## 2017-09-02 DIAGNOSIS — D509 Iron deficiency anemia, unspecified: Secondary | ICD-10-CM

## 2017-09-02 DIAGNOSIS — E1122 Type 2 diabetes mellitus with diabetic chronic kidney disease: Secondary | ICD-10-CM

## 2017-09-02 DIAGNOSIS — I129 Hypertensive chronic kidney disease with stage 1 through stage 4 chronic kidney disease, or unspecified chronic kidney disease: Secondary | ICD-10-CM | POA: Diagnosis not present

## 2017-09-02 DIAGNOSIS — F411 Generalized anxiety disorder: Secondary | ICD-10-CM

## 2017-09-02 DIAGNOSIS — N183 Chronic kidney disease, stage 3 unspecified: Secondary | ICD-10-CM

## 2017-09-02 LAB — BAYER DCA HB A1C WAIVED: HB A1C: 6.8 % (ref <7.0)

## 2017-09-02 MED ORDER — OMEPRAZOLE 40 MG PO CPDR: 40.0000 mg | DELAYED_RELEASE_CAPSULE | Freq: Two times a day (BID) | ORAL | 1 refills | Status: DC

## 2017-09-02 MED ORDER — METFORMIN HCL ER 500 MG PO TB24: ORAL_TABLET | ORAL | 1 refills | Status: DC

## 2017-09-02 MED ORDER — LORCASERIN HCL 10 MG PO TABS: 1.0000 | ORAL_TABLET | Freq: Two times a day (BID) | ORAL | 2 refills | Status: DC

## 2017-09-02 MED ORDER — SERTRALINE HCL 100 MG PO TABS: 150.0000 mg | ORAL_TABLET | Freq: Every day | ORAL | 1 refills | Status: DC

## 2017-09-02 MED ORDER — TRIAMTERENE-HCTZ 37.5-25 MG PO TABS: 1.0000 | ORAL_TABLET | Freq: Every day | ORAL | 1 refills | Status: DC

## 2017-09-02 NOTE — Assessment & Plan Note (Signed)
Not under great control. Will increase sertraline to 150mg  and recheck 3 months.

## 2017-09-02 NOTE — Assessment & Plan Note (Signed)
Under good control. Continue current regimen. Continue to monitor. Call with any concerns. 

## 2017-09-02 NOTE — Progress Notes (Signed)
BP 131/88 (BP Location: Left Arm, Patient Position: Sitting, Cuff Size: Normal)   Pulse 77   Temp 98.5 F (36.9 C)   Ht 5\' 2"  (1.575 m)   Wt 197 lb 8 oz (89.6 kg)   LMP 09/01/2014 (Approximate)   SpO2 99%   BMI 36.12 kg/m    Subjective:    Patient ID: Tabitha Evans, female    DOB: April 07, 1960, 57 y.o.   MRN: [id number]  HPI: FONTELLA SHAN is a 57 y.o. female  Chief Complaint  Patient presents with  . Hypertension  . Diabetes  . Anxiety   HYPERTENSION Hypertension status: controlled  Satisfied with current treatment? yes Duration of hypertension: chronic BP monitoring frequency:  not checking BP range:  BP medication side effects:  no Medication compliance: excellent compliance Previous BP meds: triamterene-hctz Aspirin: no Recurrent headaches: no Visual changes: no Palpitations: no Dyspnea: no Chest pain: no Lower extremity edema: no Dizzy/lightheaded: no  DIABETES Hypoglycemic episodes:no Polydipsia/polyuria: no Visual disturbance: no Chest pain: no Paresthesias: no Glucose Monitoring: no Taking Insulin?: no Blood Pressure Monitoring: not checking Retinal Examination: Up to Date Foot Exam: Up to Date Diabetic Education: Completed Pneumovax: Up to Date Influenza: Up to Date Aspirin: no  ANXIETY/STRESS Duration:stable Anxious mood: yes  Excessive worrying: yes Irritability: yes  Sweating: no Nausea: no Palpitations:no Hyperventilation: no Panic attacks: no Agoraphobia: no  Obscessions/compulsions: no Depressed mood: yes Depression screen [facility] 2/9 09/02/2017 03/04/2017 03/04/2017 10/08/2015 08/22/2015  Decreased Interest 1 0 0 0 1  Down, Depressed, Hopeless 0 0 0 0 0  PHQ - 2 Score 1 0 0 0 1  Altered sleeping 3 3 3 3 3   Tired, decreased energy 2 2 2 1 1   Change in appetite 2 2 2 2 1   Feeling bad or failure about yourself  0 0 0 0 0  Trouble concentrating 2 2 2 3 2   Moving slowly or fidgety/restless 3 2 2  0 0  Suicidal thoughts 0 0 0 0 0    PHQ-9 Score 13 11 11 9 8   Difficult doing work/chores Somewhat difficult Somewhat difficult Somewhat difficult - Somewhat difficult   Anhedonia: no Weight changes: no Insomnia: yes hard to fall asleep  Hypersomnia: yes Fatigue/loss of energy: yes Feelings of worthlessness: yes Feelings of guilt: yes Impaired concentration/indecisiveness: yes Suicidal ideations: no  Crying spells: no Recent Stressors/Life Changes: yes   Relationship problems: no   Family stress: yes     Financial stress: yes    Job stress: yes    Recent death/loss: no  WEIGHT GAIN Duration: chronic Previous attempts at weight loss: yes- diet, exercise, weight watchers Complications of obesity: DM, HTN Peak weight: 200lbs Weight loss goal: 30lbs down Weight loss to date: 3 lbs Requesting obesity pharmacotherapy: yes Current weight loss supplements/medications: no Previous weight loss supplements/meds: no  Relevant past medical, surgical, family and social history reviewed and updated as indicated. Interim medical history since our last visit reviewed. Allergies and medications reviewed and updated.  Review of Systems  Constitutional: Negative.   Respiratory: Negative.   Cardiovascular: Negative.   Musculoskeletal: Negative.   Neurological: Negative.   Psychiatric/Behavioral: Negative.     Per HPI unless specifically indicated above     Objective:    BP 131/88 (BP Location: Left Arm, Patient Position: Sitting, Cuff Size: Normal)   Pulse 77   Temp 98.5 F (36.9 C)   Ht 5\' 2"  (1.575 m)   Wt 197 lb 8 oz (89.6 kg)  LMP 09/01/2014 (Approximate)   SpO2 99%   BMI 36.12 kg/m   Wt Readings from Last 3 Encounters:  09/02/17 197 lb 8 oz (89.6 kg)  06/01/17 200 lb 9 oz (91 kg)  05/14/17 200 lb 1 oz (90.7 kg)    Physical Exam  Constitutional: She is oriented to person, place, and time. She appears well-developed and well-nourished. No distress.  HENT:  Head: Normocephalic and atraumatic.  Right  Ear: Hearing normal.  Left Ear: Hearing normal.  Nose: Nose normal.  Eyes: Conjunctivae and lids are normal. Right eye exhibits no discharge. Left eye exhibits no discharge. No scleral icterus.  Cardiovascular: Normal rate, regular rhythm, normal heart sounds and intact distal pulses. Exam reveals no gallop and no friction rub.  No murmur heard. Pulmonary/Chest: Effort normal and breath sounds normal. No stridor. No respiratory distress. She has no wheezes. She has no rales. She exhibits no tenderness.  Musculoskeletal: Normal range of motion.  Neurological: She is alert and oriented to person, place, and time.  Skin: Skin is warm, dry and intact. Capillary refill takes less than 2 seconds. No rash noted. She is not diaphoretic. No erythema. No pallor.  Psychiatric: She has a normal mood and affect. Her speech is normal and behavior is normal. Judgment and thought content normal. Cognition and memory are normal.  Nursing note and vitals reviewed.   Results for orders placed or performed during the hospital encounter of 03/30/17  H Pylori, IGM, IGG, IGA AB  Result Value Ref Range   H. Pylogi, Iga Abs <9.0 0.0 - 8.9 units   H. Pylogi, Igm Abs <9.0 0.0 - 8.9 units   H Pylori IgG <0.80 0.00 - 0.79 Index Value      Assessment & Plan:   Problem List Items Addressed This Visit      Endocrine   Controlled type 2 diabetes with renal manifestation (H[facility]) - Primary    A1c going in the wrong direction at 6.8. Will continue current regimen and recheck 3 months, if still going up, will increase metformin.       Relevant Medications   metFORMIN (GLUCOPHAGE-XR) 500 MG 24 hr tablet   Other Relevant Orders   Bayer DCA Hb A1c Waived   Comprehensive metabolic panel   Lipid Panel w/o Chol/HDL Ratio     Genitourinary   Benign hypertensive renal disease    Under good control. Continue current regimen. Continue to monitor. Call with any concerns.      Relevant Orders   Comprehensive metabolic  panel     Other   Iron deficiency anemia    Rechecking levels today. Will treat as needed. Call with any concerns.       Relevant Orders   CBC with Differential/Platelet   Comprehensive metabolic panel   Iron and TIBC   Ferritin   Anxiety disorder    Not under great control. Will increase sertraline to 150mg  and recheck 3 months.       Relevant Medications   sertraline (ZOLOFT) 100 MG tablet   Other Relevant Orders   Comprehensive metabolic panel   Morbid obesity ([place])    Has done weight loss through weight watchers and exercise, not losing. Interested in bypass, but will try belviq first. Rx sent to her pharmacy. Expect a PA.      Relevant Medications   metFORMIN (GLUCOPHAGE-XR) 500 MG 24 hr tablet   Lorcaserin HCl (BELVIQ) 10 MG TABS       Follow up plan: Return in about  3 months (around 12/03/2017) for DM follow up.

## 2017-09-02 NOTE — Patient Instructions (Signed)
[facility]  Address: [address], [place], [place] [address]  Phone: [phone]  

## 2017-09-02 NOTE — Assessment & Plan Note (Signed)
A1c going in the wrong direction at 6.8. Will continue current regimen and recheck 3 months, if still going up, will increase metformin.

## 2017-09-02 NOTE — Assessment & Plan Note (Signed)
Has done weight loss through weight watchers and exercise, not losing. Interested in bypass, but will try belviq first. Rx sent to her pharmacy. Expect a PA.

## 2017-09-02 NOTE — Assessment & Plan Note (Signed)
Rechecking levels today. Will treat as needed. Call with any concerns.  

## 2017-09-03 LAB — IRON AND TIBC
IRON SATURATION: 24 % (ref 15–55)
IRON: 67 ug/dL (ref 27–159)
Total Iron Binding Capacity: 282 ug/dL (ref 250–450)
UIBC: 215 ug/dL (ref 131–425)

## 2017-09-03 LAB — LIPID PANEL W/O CHOL/HDL RATIO
Cholesterol, Total: 194 mg/dL (ref 100–199)
HDL: 44 mg/dL (ref >39)
LDL Calculated: 124 mg/dL — ABNORMAL HIGH (ref 0–99)
TRIGLYCERIDES: 129 mg/dL (ref 0–149)
VLDL Cholesterol Cal: 26 mg/dL (ref 5–40)

## 2017-09-03 LAB — COMPREHENSIVE METABOLIC PANEL
A/G RATIO: 1.4 (ref 1.2–2.2)
ALT: 50 IU/L — ABNORMAL HIGH (ref 0–32)
AST: 51 IU/L — AB (ref 0–40)
Albumin: 4.3 g/dL (ref 3.5–5.5)
Alkaline Phosphatase: 99 IU/L (ref 39–117)
BILIRUBIN TOTAL: 0.3 mg/dL (ref 0.0–1.2)
BUN/Creatinine Ratio: 18 (ref 9–23)
BUN: 11 mg/dL (ref 6–24)
CALCIUM: 9.5 mg/dL (ref 8.7–10.2)
CO2: 23 mmol/L (ref 20–29)
Chloride: 101 mmol/L (ref 96–106)
Creatinine, Ser: 0.61 mg/dL (ref 0.57–1.00)
GFR calc Af Amer: 119 mL/min/{1.73_m2} (ref >59)
GFR, EST NON AFRICAN AMERICAN: 103 mL/min/{1.73_m2} (ref >59)
GLOBULIN, TOTAL: 3 g/dL (ref 1.5–4.5)
Glucose: 118 mg/dL — ABNORMAL HIGH (ref 65–99)
POTASSIUM: 3.8 mmol/L (ref 3.5–5.2)
SODIUM: 141 mmol/L (ref 134–144)
Total Protein: 7.3 g/dL (ref 6.0–8.5)

## 2017-09-03 LAB — CBC WITH DIFFERENTIAL/PLATELET
BASOS ABS: 0 10*3/uL (ref 0.0–0.2)
Basos: 0 %
EOS (ABSOLUTE): 0.2 10*3/uL (ref 0.0–0.4)
EOS: 2 %
Hematocrit: 39.9 % (ref 34.0–46.6)
Hemoglobin: 13 g/dL (ref 11.1–15.9)
IMMATURE GRANS (ABS): 0 10*3/uL (ref 0.0–0.1)
IMMATURE GRANULOCYTES: 0 %
Lymphocytes Absolute: 2 10*3/uL (ref 0.7–3.1)
Lymphs: 31 %
MCH: 27.5 pg (ref 26.6–33.0)
MCHC: 32.6 g/dL (ref 31.5–35.7)
MCV: 84 fL (ref 79–97)
MONOCYTES: 8 %
Monocytes Absolute: 0.5 10*3/uL (ref 0.1–0.9)
Neutrophils Absolute: 3.7 10*3/uL (ref 1.4–7.0)
Neutrophils: 59 %
Platelets: 304 10*3/uL (ref 150–450)
RBC: 4.73 x10E6/uL (ref 3.77–5.28)
RDW: 14.4 % (ref 12.3–15.4)
WBC: 6.4 10*3/uL (ref 3.4–10.8)

## 2017-09-03 LAB — FERRITIN: FERRITIN: 215 ng/mL — AB (ref 15–150)

## 2017-09-03 NOTE — Telephone Encounter (Signed)
Prior authorization for Belviq was initiated via [email or web address]. Key: [id number]

## 2017-09-07 ENCOUNTER — Encounter: Payer: Self-pay | Admitting: Gastroenterology

## 2017-09-07 ENCOUNTER — Ambulatory Visit: Payer: BLUE CROSS/BLUE SHIELD | Admitting: Gastroenterology

## 2017-09-07 VITALS — BP 149/91 | HR 75 | Ht 62.0 in | Wt 194.8 lb

## 2017-09-07 DIAGNOSIS — K259 Gastric ulcer, unspecified as acute or chronic, without hemorrhage or perforation: Secondary | ICD-10-CM

## 2017-09-07 DIAGNOSIS — Z1211 Encounter for screening for malignant neoplasm of colon: Secondary | ICD-10-CM

## 2017-09-07 NOTE — Addendum Note (Signed)
Addended by: Earl Lagos on: 09/07/2017 04:58 PM   Modules accepted: Orders

## 2017-09-07 NOTE — Progress Notes (Signed)
[name], MD [address]  [address]  [place], [place] [address]  Main: [phone]  Fax: [phone]   Primary Care Physician: Valerie Roys, DO  Primary Gastroenterologist:  Dr. [name]  Chief Complaint  Patient presents with  . Follow-up    3 months GI Bleed    HPI: Tabitha Evans is a 57 y.o. female here for follow-up of hematochezia and history of gastric ulcer with bleeding treated with clips and attributed to NSAID use.  Patient still reports taking ibuprofen occasionally.  Denies melena or hematochezia. The patient denies abdominal or flank pain, anorexia, nausea or vomiting, dysphagia, change in bowel habits or black or bloody stools or weight loss. H. pylori serology in February 2019 was negative.  Biopsies of stomach were not done at the time of the EGD due to GI bleeding at that time.  Iron and ferritin work-up did not show any iron deficiency.  Current Outpatient Medications  Medication Sig Dispense Refill  . albuterol (PROVENTIL HFA;VENTOLIN HFA) 108 (90 Base) MCG/ACT inhaler Inhale 2 puffs into the lungs every 6 (six) hours as needed for wheezing or shortness of breath. 1 Inhaler 2  . ferrous sulfate 325 (65 FE) MG tablet Take 325 mg by mouth daily with breakfast.    . fluticasone (FLONASE) 50 MCG/ACT nasal spray Place 2 sprays into both nostrils daily. 16 g 6  . metFORMIN (GLUCOPHAGE-XR) 500 MG 24 hr tablet TAKE 1 TABLET BY MOUTH ONCE DAILY WITH BREAKFAST 90 tablet 1  . omeprazole (PRILOSEC) 40 MG capsule Take 1 capsule (40 mg total) by mouth 2 (two) times daily before a meal. 180 capsule 1  . sertraline (ZOLOFT) 100 MG tablet Take 1.5 tablets (150 mg total) by mouth daily. 135 tablet 1  . triamterene-hydrochlorothiazide (MAXZIDE-25) 37.5-25 MG tablet Take 1 tablet by mouth daily. 90 tablet 1  . Lorcaserin HCl (BELVIQ) 10 MG TABS Take 1 tablet by mouth 2 (two) times daily. (Patient not taking: Reported on 09/07/2017) 60 tablet 2   No  current facility-administered medications for this visit.     Allergies as of 09/07/2017  . (No Known Allergies)    ROS:  General: Negative for anorexia, weight loss, fever, chills, fatigue, weakness. ENT: Negative for hoarseness, difficulty swallowing , nasal congestion. CV: Negative for chest pain, angina, palpitations, dyspnea on exertion, peripheral edema.  Respiratory: Negative for dyspnea at rest, dyspnea on exertion, cough, sputum, wheezing.  GI: See history of present illness. GU:  Negative for dysuria, hematuria, urinary incontinence, urinary frequency, nocturnal urination.  Endo: Negative for unusual weight change.    Physical Examination:   BP (!) 149/91   Pulse 75   Ht 5\' 2"  (1.575 m)   Wt 194 lb 12.8 oz (88.4 kg)   LMP 09/01/2014 (Approximate)   BMI 35.63 kg/m   General: Well-nourished, well-developed in no acute distress.  Eyes: No icterus. Conjunctivae pink. Mouth: Oropharyngeal mucosa moist and pink , no lesions erythema or exudate. Neck: Supple, Trachea midline Abdomen: Bowel sounds are normal, nontender, nondistended, no hepatosplenomegaly or masses, no abdominal bruits or hernia , no rebound or guarding.   Extremities: No lower extremity edema. No clubbing or deformities. Neuro: Alert and oriented x 3.  Grossly intact. Skin: Warm and dry, no jaundice.   Psych: Alert and cooperative, normal mood and affect.   Labs: CMP     Component Value Date/Time   NA 141 09/02/2017 0930   NA 136 11/28/2013 1745   K 3.8 09/02/2017  0930   K 3.1 (L) 11/28/2013 1745   CL 101 09/02/2017 0930   CL 100 11/28/2013 1745   CO2 23 09/02/2017 0930   CO2 26 11/28/2013 1745   GLUCOSE 118 (H) 09/02/2017 0930   GLUCOSE 166 (H) 02/27/2017 0440   GLUCOSE 118 (H) 11/28/2013 1745   BUN 11 09/02/2017 0930   BUN 13 11/28/2013 1745   CREATININE 0.61 09/02/2017 0930   CREATININE 0.84 11/28/2013 1745   CALCIUM 9.5 09/02/2017 0930   CALCIUM 9.0 11/28/2013 1745   PROT 7.3  09/02/2017 0930   ALBUMIN 4.3 09/02/2017 0930   AST 51 (H) 09/02/2017 0930   ALT 50 (H) 09/02/2017 0930   ALKPHOS 99 09/02/2017 0930   BILITOT 0.3 09/02/2017 0930   GFRNONAA 103 09/02/2017 0930   GFRNONAA >60 11/28/2013 1745   GFRAA 119 09/02/2017 0930   GFRAA >60 11/28/2013 1745   Lab Results  Component Value Date   WBC 6.4 09/02/2017   HGB 13.0 09/02/2017   HCT 39.9 09/02/2017   MCV 84 09/02/2017   PLT 304 09/02/2017    Imaging Studies: No results found.  Assessment and Plan:   Tabitha Evans is a 57 y.o. y/o female here for follow-up of gastric ulcer causing GI bleed in January 2019  Patient was educated again about avoiding NSAIDs as she takes occasional ibuprofen She verbalized understanding She was supposed to have an EGD for reevaluation of the stomach, and obtain biopsies, and also have a screening colonoscopy scheduled.  However, she did not schedule this, but is willing to schedule it at this time I have discussed alternative options, risks & benefits,  which include, but are not limited to, bleeding, infection, perforation,respiratory complication & drug reaction.  The patient agrees with this plan & written consent will be obtained.    Continue omeprazole due to history of gastric ulcer, and continued occasional NSAID use After EGD, can decrease or discontinue this medication Due to her continued NSAID use, history of gastric ulcer causing GI bleed, benefit of medication outweigh risks at this time (Risks of PPI use were discussed with patient including bone loss, C. Diff diarrhea, pneumonia, infections, CKD, electrolyte abnormalities.  If clinically possible based on symptoms, goal would be to maintain patient on the lowest dose possible, or discontinue the medication with institution of acid reflux lifestyle modifications over time. Pt. Verbalizes understanding and chooses to continue the medication.)     Dr [name]

## 2017-09-09 ENCOUNTER — Telehealth: Payer: Self-pay

## 2017-09-09 NOTE — Telephone Encounter (Signed)
I don't see that on her med list- she's seeing Dr. Hadley Pen for her ulcers, she should probably contact her for a refill on that.

## 2017-09-09 NOTE — Telephone Encounter (Signed)
Pt.notified

## 2017-09-09 NOTE — Telephone Encounter (Signed)
Refill request for Sucralfate 1gm tablet 1 tablet by mouth 4 times a daily.

## 2017-10-01 ENCOUNTER — Encounter: Payer: Self-pay | Admitting: *Deleted

## 2017-10-01 ENCOUNTER — Ambulatory Visit: Payer: BLUE CROSS/BLUE SHIELD | Admitting: Registered Nurse

## 2017-10-01 ENCOUNTER — Other Ambulatory Visit: Payer: Self-pay

## 2017-10-01 ENCOUNTER — Ambulatory Visit
Admission: RE | Admit: 2017-10-01 | Discharge: 2017-10-01 | Disposition: A | Discharge status: Home / Self Care | Payer: BLUE CROSS/BLUE SHIELD | Source: Ambulatory Visit | Attending: Gastroenterology | Admitting: Gastroenterology

## 2017-10-01 ENCOUNTER — Encounter
Admission: RE | Disposition: A | Discharge status: Home / Self Care | Payer: Self-pay | Source: Ambulatory Visit | Attending: Gastroenterology

## 2017-10-01 DIAGNOSIS — I10 Essential (primary) hypertension: Secondary | ICD-10-CM | POA: Diagnosis not present

## 2017-10-01 DIAGNOSIS — Z86010 Personal history of colonic polyps: Secondary | ICD-10-CM | POA: Insufficient documentation

## 2017-10-01 DIAGNOSIS — K259 Gastric ulcer, unspecified as acute or chronic, without hemorrhage or perforation: Secondary | ICD-10-CM

## 2017-10-01 DIAGNOSIS — K257 Chronic gastric ulcer without hemorrhage or perforation: Secondary | ICD-10-CM

## 2017-10-01 DIAGNOSIS — Z7984 Long term (current) use of oral hypoglycemic drugs: Secondary | ICD-10-CM | POA: Insufficient documentation

## 2017-10-01 DIAGNOSIS — K228 Other specified diseases of esophagus: Secondary | ICD-10-CM

## 2017-10-01 DIAGNOSIS — K317 Polyp of stomach and duodenum: Secondary | ICD-10-CM

## 2017-10-01 DIAGNOSIS — Z1211 Encounter for screening for malignant neoplasm of colon: Secondary | ICD-10-CM

## 2017-10-01 DIAGNOSIS — K219 Gastro-esophageal reflux disease without esophagitis: Secondary | ICD-10-CM | POA: Insufficient documentation

## 2017-10-01 DIAGNOSIS — K2950 Unspecified chronic gastritis without bleeding: Secondary | ICD-10-CM | POA: Insufficient documentation

## 2017-10-01 DIAGNOSIS — K5730 Diverticulosis of large intestine without perforation or abscess without bleeding: Secondary | ICD-10-CM

## 2017-10-01 DIAGNOSIS — R7303 Prediabetes: Secondary | ICD-10-CM | POA: Diagnosis not present

## 2017-10-01 DIAGNOSIS — K2289 Other specified disease of esophagus: Secondary | ICD-10-CM

## 2017-10-01 DIAGNOSIS — Z8249 Family history of ischemic heart disease and other diseases of the circulatory system: Secondary | ICD-10-CM | POA: Insufficient documentation

## 2017-10-01 DIAGNOSIS — K279 Peptic ulcer, site unspecified, unspecified as acute or chronic, without hemorrhage or perforation: Secondary | ICD-10-CM | POA: Diagnosis not present

## 2017-10-01 DIAGNOSIS — Z79899 Other long term (current) drug therapy: Secondary | ICD-10-CM | POA: Insufficient documentation

## 2017-10-01 DIAGNOSIS — K3189 Other diseases of stomach and duodenum: Secondary | ICD-10-CM

## 2017-10-01 HISTORY — PX: ESOPHAGOGASTRODUODENOSCOPY (EGD) WITH PROPOFOL: SHX5813

## 2017-10-01 HISTORY — PX: COLONOSCOPY WITH PROPOFOL: SHX5780

## 2017-10-01 HISTORY — DX: Prediabetes: R73.03

## 2017-10-01 LAB — GLUCOSE, CAPILLARY: Glucose-Capillary: 106 mg/dL — ABNORMAL HIGH (ref 70–99)

## 2017-10-01 SURGERY — COLONOSCOPY WITH PROPOFOL
Anesthesia: General

## 2017-10-01 MED ORDER — FENTANYL CITRATE (PF) 100 MCG/2ML IJ SOLN
INTRAMUSCULAR | Status: AC
  Filled 2017-10-01: qty 2

## 2017-10-01 MED ORDER — GLYCOPYRROLATE 0.2 MG/ML IJ SOLN
INTRAMUSCULAR | Status: DC | PRN
  Administered 2017-10-01: 0.2 mg via INTRAVENOUS

## 2017-10-01 MED ORDER — GLYCOPYRROLATE 0.2 MG/ML IJ SOLN
INTRAMUSCULAR | Status: AC
  Filled 2017-10-01: qty 1

## 2017-10-01 MED ORDER — MIDAZOLAM HCL 2 MG/2ML IJ SOLN
INTRAMUSCULAR | Status: DC | PRN
  Administered 2017-10-01: 2 mg via INTRAVENOUS

## 2017-10-01 MED ORDER — PROPOFOL 500 MG/50ML IV EMUL
INTRAVENOUS | Status: DC | PRN
  Administered 2017-10-01: 150 ug/kg/min via INTRAVENOUS

## 2017-10-01 MED ORDER — OMEPRAZOLE 40 MG PO CPDR: 40.0000 mg | DELAYED_RELEASE_CAPSULE | Freq: Every day | ORAL | 2 refills | Status: DC

## 2017-10-01 MED ORDER — PROPOFOL 500 MG/50ML IV EMUL
INTRAVENOUS | Status: AC
  Filled 2017-10-01: qty 50

## 2017-10-01 MED ORDER — LIDOCAINE HCL (CARDIAC) PF 100 MG/5ML IV SOSY
PREFILLED_SYRINGE | INTRAVENOUS | Status: DC | PRN
  Administered 2017-10-01: 60 mg via INTRAVENOUS
  Administered 2017-10-01: 40 mg via INTRAVENOUS

## 2017-10-01 MED ORDER — MIDAZOLAM HCL 2 MG/2ML IJ SOLN
INTRAMUSCULAR | Status: AC
  Filled 2017-10-01: qty 2

## 2017-10-01 MED ORDER — SODIUM CHLORIDE 0.9 % IV SOLN
INTRAVENOUS | Status: DC
  Administered 2017-10-01: 10:00:00 via INTRAVENOUS

## 2017-10-01 MED ORDER — PROPOFOL 10 MG/ML IV BOLUS
INTRAVENOUS | Status: DC | PRN
  Administered 2017-10-01: 10 mg via INTRAVENOUS
  Administered 2017-10-01: 80 mg via INTRAVENOUS

## 2017-10-01 MED ORDER — FENTANYL CITRATE (PF) 100 MCG/2ML IJ SOLN
INTRAMUSCULAR | Status: DC | PRN
  Administered 2017-10-01: 25 ug via INTRAVENOUS

## 2017-10-01 MED ORDER — PROPOFOL 10 MG/ML IV BOLUS
INTRAVENOUS | Status: AC
  Filled 2017-10-01: qty 20

## 2017-10-01 NOTE — Anesthesia Post-op Follow-up Note (Signed)
Anesthesia QCDR form completed.        

## 2017-10-01 NOTE — Op Note (Signed)
[facility] Patient Name: Tabitha Evans Procedure Date: 10/01/2017 9:44 AM MRN: [id number] Account #: [id number] Date of Birth: [birth year] Admit Type: Outpatient Age: 57 Room: [facility] ENDO ROOM 2 Gender: Female Note Status: Finalized Procedure:            Upper GI endoscopy Indications:          Peptic ulcer, Follow-up of peptic ulcer Providers:            Ieesha Abbasi B. Bonna Gains MD, MD Referring MD:         Valerie Roys (Referring MD) Medicines:            Monitored Anesthesia Care Complications:        No immediate complications. Procedure:            Pre-Anesthesia Assessment:                       - Prior to the procedure, a History and Physical was                        performed, and patient medications, allergies and                        sensitivities were reviewed. The patient's tolerance of                        previous anesthesia was reviewed.                       - The risks and benefits of the procedure and the                        sedation options and risks were discussed with the                        patient. All questions were answered and informed                        consent was obtained.                       - Patient identification and proposed procedure were                        verified prior to the procedure by the physician, the                        nurse, the anesthesiologist, the anesthetist and the                        technician. The procedure was verified in the procedure                        room.                       - ASA Grade Assessment: II - A patient with mild                        systemic disease.  After obtaining informed consent, the endoscope was                        passed under direct vision. Throughout the procedure,                        the patient's blood pressure, pulse, and oxygen                        saturations were monitored continuously. The  Endoscope                        was introduced through the mouth, and advanced to the                        second part of duodenum. The upper GI endoscopy was                        accomplished with ease. The patient tolerated the                        procedure well. Findings:      The examined esophagus was normal.      The Z-line was irregular and was found 34 cm from the incisors. As per       guidelines salmon colored mucosa above 1 cm should be biopsied. This was      not the case here, and the Z-line was just irregular and salmon colored       mucosa was      not above 1 cm in length.      One non-bleeding gastric ulcer with no stigmata of bleeding was found in       the gastric body. The lesion was 4 mm in largest dimension. 4 clips were       placed during her previous EGD and 2 of them were still seen in place on       today's exam.      Patchy mildly erythematous mucosa without bleeding was found in the       gastric antrum. Biopsies were taken with a cold forceps for histology.       Biopsies were obtained in the gastric body, at the incisura and in the       gastric antrum with cold forceps for histology.      A few 3 to 4 mm sessile polyps with no bleeding and no stigmata of       recent bleeding were found in the gastric body. Biopsies were taken with       a cold forceps for histology.      The duodenal bulb, second portion of the duodenum and examined duodenum       were normal. Impression:           - Normal esophagus.                       - Z-line irregular, 34 cm from the incisors.                       - Non-bleeding gastric ulcer with no stigmata of  bleeding. The lesion was 4 mm in largest dimension. 4                        clips were placed during her previous EGD and 2 of them                        were still seen in place on today's exam.                       - Erythematous mucosa in the antrum. Biopsied.                       -  A few gastric polyps. Biopsied.                       - Normal duodenal bulb, second portion of the duodenum                        and examined duodenum.                       - Biopsies were obtained in the gastric body, at the                        incisura and in the gastric antrum. Recommendation:       - Await pathology results.                       - Discharge patient to home (with escort).                       - Advance diet as tolerated.                       - Continue present medications.                       - Patient has a contact number available for                        emergencies. The signs and symptoms of potential                        delayed complications were discussed with the patient.                        Return to normal activities tomorrow. Written discharge                        instructions were provided to the patient.                       - Discharge patient to home (with escort).                       - The findings and recommendations were discussed with                        the patient.                       -  The findings and recommendations were discussed with                        the patient's family.                       - Decrease Omeprazole to one daily for 30 days and then                        stop.                       Repeat EGD in 3 months to assess complete ulcer healing                        and ensure clips have fallen off. Procedure Code(s):    --- Professional ---                       [phone], Esophagogastroduodenoscopy, flexible, transoral;                        with biopsy, single or multiple Diagnosis Code(s):    --- Professional ---                       K22.8, Other specified diseases of esophagus                       K25.9, Gastric ulcer, unspecified as acute or chronic,                        without hemorrhage or perforation                       K31.89, Other diseases of stomach and duodenum                        K31.7, Polyp of stomach and duodenum                       K27.9, Peptic ulcer, site unspecified, unspecified as                        acute or chronic, without hemorrhage or perforation CPT copyright 2017 American Medical Association. All rights reserved. The codes documented in this report are preliminary and upon coder review may  be revised to meet current compliance requirements.  [name], MD Margretta Sidle B. Bonna Gains MD, MD 10/01/2017 10:17:24 AM This report has been signed electronically. Number of Addenda: 0 Note Initiated On: 10/01/2017 9:44 AM Estimated Blood Loss: Estimated blood loss: none.      [facility]

## 2017-10-01 NOTE — Transfer of Care (Signed)
Immediate Anesthesia Transfer of Care Note  Patient: Tabitha Evans  Procedure(s) Performed: Procedure(s): COLONOSCOPY WITH PROPOFOL (N/A) ESOPHAGOGASTRODUODENOSCOPY (EGD) WITH PROPOFOL (N/A)  Patient Location: PACU and Endoscopy Unit  Anesthesia Type:General  Level of Consciousness: sedated  Airway & Oxygen Therapy: Patient Spontanous Breathing and Patient connected to nasal cannula oxygen  Post-op Assessment: Report given to RN and Post -op Vital signs reviewed and stable  Post vital signs: Reviewed and stable  Last Vitals:  Vitals:   10/01/17 0918 10/01/17 1049  BP: 135/87   Pulse: 80 70  Resp: 16 16  Temp: (!) 35.9 C (!) 36.1 C  SpO2: 01% 41%    Complications: No apparent anesthesia complications

## 2017-10-01 NOTE — Anesthesia Preprocedure Evaluation (Signed)
Anesthesia Evaluation  Patient identified by MRN, date of birth, ID band Patient awake    Reviewed: Allergy & Precautions, H&P , NPO status , Patient's Chart, lab work & pertinent test results, reviewed documented beta blocker date and time   Airway Mallampati: II   Neck ROM: full    Dental  (+) Poor Dentition   Pulmonary neg pulmonary ROS,    Pulmonary exam normal        Cardiovascular Exercise Tolerance: Poor hypertension, On Medications negative cardio ROS Normal cardiovascular exam Rhythm:regular Rate:Normal     Neuro/Psych  Headaches, PSYCHIATRIC DISORDERS Anxiety negative neurological ROS  negative psych ROS   GI/Hepatic negative GI ROS, Neg liver ROS, PUD, GERD  Medicated,  Endo/Other  negative endocrine ROSdiabetes  Renal/GU Renal diseasenegative Renal ROS  negative genitourinary   Musculoskeletal   Abdominal   Peds  Hematology negative hematology ROS (+) anemia ,   Anesthesia Other Findings Past Medical History: No date: Acid reflux No date: Colon polyp No date: Frequent headaches No date: Hypertension No date: Pre-diabetes Past Surgical History: No date: COLONOSCOPY 02/26/2017: ESOPHAGOGASTRODUODENOSCOPY; N/A     Comment:  Procedure: ESOPHAGOGASTRODUODENOSCOPY (EGD);  Surgeon:               Virgel Manifold, MD;  Location: [facility] ENDOSCOPY;                Service: Endoscopy;  Laterality: N/A; No date: UPPER GASTROINTESTINAL ENDOSCOPY BMI    Body Mass Index:  34.75 kg/m     Reproductive/Obstetrics negative OB ROS                             Anesthesia Physical Anesthesia Plan  ASA: III  Anesthesia Plan: General   Post-op Pain Management:    Induction:   PONV Risk Score and Plan:   Airway Management Planned:   Additional Equipment:   Intra-op Plan:   Post-operative Plan:   Informed Consent: I have reviewed the patients History and Physical, chart, labs  and discussed the procedure including the risks, benefits and alternatives for the proposed anesthesia with the patient or authorized representative who has indicated his/her understanding and acceptance.   Dental Advisory Given  Plan Discussed with: CRNA  Anesthesia Plan Comments:         Anesthesia Quick Evaluation

## 2017-10-01 NOTE — H&P (Signed)
[name], MD [address] [place], [place], [place], [address] [address], [place], [place], [place], [address] Phone: [phone]  Fax: [phone]  Primary Care Physician:  Valerie Roys, DO   Pre-Procedure History & Physical: HPI:  Tabitha Evans is a 57 y.o. female is here for a colonoscopy and EGD.   Past Medical History:  Diagnosis Date  . Acid reflux   . Colon polyp   . Frequent headaches   . Hypertension   . Pre-diabetes     Past Surgical History:  Procedure Laterality Date  . COLONOSCOPY    . ESOPHAGOGASTRODUODENOSCOPY N/A 02/26/2017   Procedure: ESOPHAGOGASTRODUODENOSCOPY (EGD);  Surgeon: Virgel Manifold, MD;  Location: [facility] ENDOSCOPY;  Service: Endoscopy;  Laterality: N/A;  . UPPER GASTROINTESTINAL ENDOSCOPY      Prior to Admission medications   Medication Sig Start Date End Date Taking? Authorizing Provider  albuterol (PROVENTIL HFA;VENTOLIN HFA) 108 (90 Base) MCG/ACT inhaler Inhale 2 puffs into the lungs every 6 (six) hours as needed for wheezing or shortness of breath. 05/14/17  Yes Johnson, Megan P, DO  ferrous sulfate 325 (65 FE) MG tablet Take 325 mg by mouth daily with breakfast.   Yes [provider]  fluticasone (FLONASE) 50 MCG/ACT nasal spray Place 2 sprays into both nostrils daily. 06/01/17  Yes Johnson, Megan P, DO  Lorcaserin HCl (BELVIQ) 10 MG TABS Take 1 tablet by mouth 2 (two) times daily. 09/02/17  Yes Johnson, Megan P, DO  metFORMIN (GLUCOPHAGE-XR) 500 MG 24 hr tablet TAKE 1 TABLET BY MOUTH ONCE DAILY WITH BREAKFAST 09/02/17  Yes Johnson, Megan P, DO  omeprazole (PRILOSEC) 40 MG capsule Take 1 capsule (40 mg total) by mouth 2 (two) times daily before a meal. 09/02/17  Yes Johnson, Megan P, DO  sertraline (ZOLOFT) 100 MG tablet Take 1.5 tablets (150 mg total) by mouth daily. 09/02/17  Yes Johnson, Megan P, DO  triamterene-hydrochlorothiazide (MAXZIDE-25) 37.5-25 MG tablet Take 1 tablet by mouth daily. 09/02/17  Yes  Johnson, Megan P, DO    Allergies as of 09/07/2017  . (No Known Allergies)    Family History  Problem Relation Age of Onset  . Hypertension Mother   . Hyperlipidemia Mother   . Glaucoma Mother   . Liver disease Father   . Cancer Father   . Glaucoma Sister   . Hypertension Brother   . Dementia Maternal Grandmother   . Glaucoma Sister     Social History   Socioeconomic History  . Marital status: Single    Spouse name: Not on file  . Number of children: Not on file  . Years of education: Not on file  . Highest education level: Not on file  Occupational History  . Not on file  Social Needs  . Financial resource strain: Not on file  . Food insecurity:    Worry: Not on file    Inability: Not on file  . Transportation needs:    Medical: Not on file    Non-medical: Not on file  Tobacco Use  . Smoking status: Never Smoker  . Smokeless tobacco: Never Used  Substance and Sexual Activity  . Alcohol use: Yes    Alcohol/week: 3.0 standard drinks    Types: 3 Cans of beer per week    Comment: on the weekends  . Drug use: No  . Sexual activity: Not Currently  Lifestyle  . Physical activity:    Days per week: Not on file    Minutes per  session: Not on file  . Stress: Not on file  Relationships  . Social connections:    Talks on phone: Not on file    Gets together: Not on file    Attends religious service: Not on file    Active member of club or organization: Not on file    Attends meetings of clubs or organizations: Not on file    Relationship status: Not on file  . Intimate partner violence:    Fear of current or ex partner: Not on file    Emotionally abused: Not on file    Physically abused: Not on file    Forced sexual activity: Not on file  Other Topics Concern  . Not on file  Social History Narrative  . Not on file    Review of Systems: See HPI, otherwise negative ROS  Physical Exam: BP 135/87   Pulse 80   Temp (!) 96.7 F (35.9 C)   Resp 16   Ht 5'  2" (1.575 m)   Wt 86.2 kg   LMP 09/01/2014 (Approximate)   SpO2 99%   BMI 34.75 kg/m  General:   Alert,  pleasant and cooperative in NAD Head:  Normocephalic and atraumatic. Neck:  Supple; no masses or thyromegaly. Lungs:  Clear throughout to auscultation, normal respiratory effort.    Heart:  +S1, +S2, Regular rate and rhythm, No edema. Abdomen:  Soft, nontender and nondistended. Normal bowel sounds, without guarding, and without rebound.   Neurologic:  Alert and  oriented x4;  grossly normal neurologically.  Impression/Plan: Tabitha Evans is here for a colonoscopy to be performed for average risk screening and EGD for history of gastric ulcer and GI Bleed  Risks, benefits, limitations, and alternatives regarding the procedures have been reviewed with the patient.  Questions have been answered.  All parties agreeable.   Virgel Manifold, MD  10/01/2017, 9:42 AM

## 2017-10-01 NOTE — Op Note (Signed)
[facility] Gastroenterology Patient Name: Tabitha Evans Procedure Date: 10/01/2017 9:43 AM MRN: [id number] Account #: [id number] Date of Birth: 1961/01/30 Admit Type: Outpatient Age: 57 Room: [facility] ENDO ROOM 2 Gender: Female Note Status: Finalized Procedure:            Colonoscopy Indications:          Screening for colorectal malignant neoplasm Providers:            Kyrianna Barletta B. Bonna Gains MD, MD Medicines:            Monitored Anesthesia Care Complications:        No immediate complications. Procedure:            Pre-Anesthesia Assessment:                       - Prior to the procedure, a History and Physical was                        performed, and patient medications, allergies and                        sensitivities were reviewed. The patient's tolerance of                        previous anesthesia was reviewed.                       - The risks and benefits of the procedure and the                        sedation options and risks were discussed with the                        patient. All questions were answered and informed                        consent was obtained.                       - Patient identification and proposed procedure were                        verified prior to the procedure by the physician, the                        nurse, the anesthetist and the technician. The                        procedure was verified in the pre-procedure area in the                        procedure room in the endoscopy suite.                       - ASA Grade Assessment: II - A patient with mild                        systemic disease.                       - After reviewing the risks and benefits, the patient  was deemed in satisfactory condition to undergo the                        procedure.                       After obtaining informed consent, the colonoscope was                        passed under direct vision. Throughout the  procedure,                        the patient's blood pressure, pulse, and oxygen                        saturations were monitored continuously. The                        Colonoscope was introduced through the anus and                        advanced to the the cecum, identified by appendiceal                        orifice and ileocecal valve. The colonoscopy was                        performed with ease. The patient tolerated the                        procedure well. The quality of the bowel preparation                        was good. Findings:      The perianal and digital rectal examinations were normal.      Multiple diverticula were found in the entire colon.      The exam was otherwise without abnormality.      The rectum, sigmoid colon, descending colon, transverse colon, ascending       colon and cecum appeared normal.      The retroflexed view of the distal rectum and anal verge was normal and       showed no anal or rectal abnormalities. Impression:           - Diverticulosis in the entire examined colon.                       - The examination was otherwise normal.                       - The rectum, sigmoid colon, descending colon,                        transverse colon, ascending colon and cecum are normal.                       - The distal rectum and anal verge are normal on                        retroflexion view.                       -  No specimens collected. Recommendation:       - Discharge patient to home.                       - Resume previous diet.                       - Continue present medications.                       - Repeat colonoscopy in 10 years for screening purposes.                       - Return to primary care physician as previously                        scheduled.                       - The findings and recommendations were discussed with                        the patient.                       - The findings and recommendations were  discussed with                        the patient's family.                       - High fiber diet.                       - In the future, if patient develops new symptoms such                        as blood per rectum, abdominal pain, weight loss,                        altered bowel habits or any other reason for concern,                        patient should discuss this with her PCP as she may                        need a GI referral at that time or evaluation for need                        for colonoscopy earlier than her recommended screening                        colonoscopy.                       In addition, if patient's family history of colon                        cancer changes (no family history at this time) in the                        future, earlier screening may be indicated and patient  should discuss this with PCP as well. Procedure Code(s):    --- Professional ---                       R9458, Colorectal cancer screening; colonoscopy on                        individual not meeting criteria for high risk Diagnosis Code(s):    --- Professional ---                       Z12.11, Encounter for screening for malignant neoplasm                        of colon                       K57.30, Diverticulosis of large intestine without                        perforation or abscess without bleeding CPT copyright 2017 American Medical Association. All rights reserved. The codes documented in this report are preliminary and upon coder review may  be revised to meet current compliance requirements.  [name], MD Margretta Sidle B. Bonna Gains MD, MD 10/01/2017 10:47:21 AM This report has been signed electronically. Number of Addenda: 0 Note Initiated On: 10/01/2017 9:43 AM Scope Withdrawal Time: 0 hours 11 minutes 40 seconds  Total Procedure Duration: 0 hours 24 minutes 0 seconds       [facility]

## 2017-10-02 ENCOUNTER — Encounter: Payer: Self-pay | Admitting: Gastroenterology

## 2017-10-03 NOTE — Anesthesia Postprocedure Evaluation (Signed)
Anesthesia Post Note  Patient: Tabitha Evans  Procedure(s) Performed: COLONOSCOPY WITH PROPOFOL (N/A ) ESOPHAGOGASTRODUODENOSCOPY (EGD) WITH PROPOFOL (N/A )  Patient location during evaluation: MAU Anesthesia Type: General Level of consciousness: awake and alert Pain management: pain level controlled Vital Signs Assessment: post-procedure vital signs reviewed and stable Respiratory status: spontaneous breathing, nonlabored ventilation, respiratory function stable and patient connected to nasal cannula oxygen Cardiovascular status: blood pressure returned to baseline and stable Postop Assessment: no apparent nausea or vomiting Anesthetic complications: no     Last Vitals:  Vitals:   10/01/17 0918 10/01/17 1049  BP: 135/87   Pulse: 80 70  Resp: 16 16  Temp: (!) 35.9 C (!) 36.1 C  SpO2: 99% 94%    Last Pain:  Vitals:   10/03/17 0818  TempSrc:   PainSc: 0-No pain                 Molli Barrows

## 2017-10-06 LAB — SURGICAL PATHOLOGY

## 2017-11-02 ENCOUNTER — Ambulatory Visit
Admission: RE | Admit: 2017-11-02 | Discharge: 2017-11-02 | Disposition: A | Discharge status: Home / Self Care | Payer: BLUE CROSS/BLUE SHIELD | Source: Ambulatory Visit | Attending: Family Medicine | Admitting: Family Medicine

## 2017-11-02 DIAGNOSIS — Z1239 Encounter for other screening for malignant neoplasm of breast: Secondary | ICD-10-CM

## 2017-11-02 DIAGNOSIS — Z1231 Encounter for screening mammogram for malignant neoplasm of breast: Secondary | ICD-10-CM | POA: Insufficient documentation

## 2017-12-07 ENCOUNTER — Encounter: Payer: Self-pay | Admitting: Family Medicine

## 2017-12-07 ENCOUNTER — Ambulatory Visit (INDEPENDENT_AMBULATORY_CARE_PROVIDER_SITE_OTHER): Payer: BLUE CROSS/BLUE SHIELD | Admitting: Family Medicine

## 2017-12-07 VITALS — BP 134/87 | HR 74 | Wt 183.0 lb

## 2017-12-07 DIAGNOSIS — E1122 Type 2 diabetes mellitus with diabetic chronic kidney disease: Secondary | ICD-10-CM

## 2017-12-07 DIAGNOSIS — Z23 Encounter for immunization: Secondary | ICD-10-CM | POA: Diagnosis not present

## 2017-12-07 DIAGNOSIS — F411 Generalized anxiety disorder: Secondary | ICD-10-CM

## 2017-12-07 DIAGNOSIS — N183 Chronic kidney disease, stage 3 (moderate): Secondary | ICD-10-CM

## 2017-12-07 LAB — BAYER DCA HB A1C WAIVED: HB A1C: 6.3 % (ref <7.0)

## 2017-12-07 MED ORDER — LORCASERIN HCL 10 MG PO TABS: 1.0000 | ORAL_TABLET | Freq: Two times a day (BID) | ORAL | 1 refills | Status: DC

## 2017-12-07 NOTE — Progress Notes (Signed)
BP 134/87   Pulse 74   Wt 183 lb (83 kg)   LMP 09/01/2014 (Approximate)   SpO2 98%   BMI 33.47 kg/m    Subjective:    Patient ID: Tabitha Evans, female    DOB: 08/22/1960, 57 y.o.   MRN: [id number]  HPI: Tabitha Evans is a 57 y.o. female  Chief Complaint  Patient presents with  . Follow-up  . Diabetes  . mood   WEIGHT GAIN Duration: chronic Previous attempts at weight loss: yes Complications of obesity: DM. HTN Peak weight: 200 Weight loss goal:170  Weight loss to date: 17lbs! Requesting obesity pharmacotherapy: yes Current weight loss supplements/medications: yes Previous weight loss supplements/meds: no  DIABETES Hypoglycemic episodes:no Polydipsia/polyuria: no Visual disturbance: no Chest pain: no Paresthesias: no Glucose Monitoring: no Taking Insulin?: no Blood Pressure Monitoring: not checking Retinal Examination: Up to Date Foot Exam: Up to Date Diabetic Education: Completed Pneumovax: Up to Date Influenza: Up to Date Aspirin: no  DEPRESSION Mood status: better Satisfied with current treatment?: no Symptom severity: moderate  Duration of current treatment : chronic Side effects: no Medication compliance: excellent compliance Psychotherapy/counseling: no  Previous psychiatric medications: zoloft Depressed mood: yes Anxious mood: yes Anhedonia: no Significant weight loss or gain: no Insomnia: yes hard to fall asleep Fatigue: yes Feelings of worthlessness or guilt: no Impaired concentration/indecisiveness: no Suicidal ideations: no Hopelessness: no Crying spells: no Depression screen [facility] 2/9 12/07/2017 09/02/2017 03/04/2017 03/04/2017 10/08/2015  Decreased Interest 1 1 0 0 0  Down, Depressed, Hopeless 0 0 0 0 0  PHQ - 2 Score 1 1 0 0 0  Altered sleeping 3 3 3 3 3   Tired, decreased energy 1 2 2 2 1   Change in appetite 0 2 2 2 2   Feeling bad or failure about yourself  0 0 0 0 0  Trouble concentrating 0 2 2 2 3   Moving slowly or  fidgety/restless 3 3 2 2  0  Suicidal thoughts 0 0 0 0 0  PHQ-9 Score 8 13 11 11 9   Difficult doing work/chores Somewhat difficult Somewhat difficult Somewhat difficult Somewhat difficult -     Relevant past medical, surgical, family and social history reviewed and updated as indicated. Interim medical history since our last visit reviewed. Allergies and medications reviewed and updated.  Review of Systems  Constitutional: Negative.   Respiratory: Negative.   Cardiovascular: Negative.   Gastrointestinal: Negative.   Skin: Negative.   Psychiatric/Behavioral: Positive for dysphoric mood. Negative for agitation, behavioral problems, confusion, decreased concentration, hallucinations, self-injury, sleep disturbance and suicidal ideas. The patient is nervous/anxious. The patient is not hyperactive.     Per HPI unless specifically indicated above     Objective:    BP 134/87   Pulse 74   Wt 183 lb (83 kg)   LMP 09/01/2014 (Approximate)   SpO2 98%   BMI 33.47 kg/m   Wt Readings from Last 3 Encounters:  12/07/17 183 lb (83 kg)  10/01/17 190 lb (86.2 kg)  09/07/17 194 lb 12.8 oz (88.4 kg)    Physical Exam  Constitutional: She is oriented to person, place, and time. She appears well-developed and well-nourished. No distress.  HENT:  Head: Normocephalic and atraumatic.  Right Ear: Hearing normal.  Left Ear: Hearing normal.  Nose: Nose normal.  Eyes: Conjunctivae and lids are normal. Right eye exhibits no discharge. Left eye exhibits no discharge. No scleral icterus.  Cardiovascular: Normal rate, regular rhythm, normal heart sounds and intact distal pulses. Exam  reveals no gallop and no friction rub.  No murmur heard. Pulmonary/Chest: Effort normal and breath sounds normal. No stridor. No respiratory distress. She has no wheezes. She has no rales. She exhibits no tenderness.  Musculoskeletal: Normal range of motion.  Neurological: She is alert and oriented to person, place, and  time.  Skin: Skin is warm, dry and intact. Capillary refill takes less than 2 seconds. No rash noted. She is not diaphoretic. No erythema. No pallor.  Psychiatric: She has a normal mood and affect. Her speech is normal and behavior is normal. Judgment and thought content normal. Cognition and memory are normal.  Nursing note and vitals reviewed.   Results for orders placed or performed during the hospital encounter of 10/01/17  Glucose, capillary  Result Value Ref Range   Glucose-Capillary 106 (H) 70 - 99 mg/dL  Surgical pathology  Result Value Ref Range   SURGICAL PATHOLOGY      Surgical Pathology CASE: [id number] PATIENT: Tabitha Evans Surgical Pathology Report     SPECIMEN SUBMITTED: A. Stomach, r/o h pylori; cbx B. Stomach polyp; cbx  CLINICAL HISTORY: None provided  PRE-OPERATIVE DIAGNOSIS: Z12.11 screening colonoscopy; K25.9 gastric ulcer  POST-OPERATIVE DIAGNOSIS: Diverticulosis     DIAGNOSIS: A.  STOMACH; COLD BIOPSY: - MILD CHRONIC GASTRITIS WITH FOCAL ACTIVE INFLAMMATION. - NEGATIVE FOR H PYLORI BY IMMUNOHISTOCHEMISTRY. - NEGATIVE FOR INTESTINAL METAPLASIA, DYSPLASIA, AND MALIGNANCY.  B.  STOMACH POLYP; COLD BIOPSY: - FUNDIC GLAND POLYP. - NEGATIVE FOR DYSPLASIA AND MALIGNANCY.   GROSS DESCRIPTION: A. Labeled: C BX gastric to rule out H. pylori Received: Formalin Tissue fragment(s): Multiple Size: Aggregate, 0.8 x 0.4 x 0.2 cm Description: Tan-pink soft tissue fragments Entirely submitted in one cassette.  B. Labeled: C BX gastric polyp Received: Formalin Tissue fragment(s): 1 Size: 0.3 cm Description : Tan-pink soft tissue fragment Entirely submitted in one cassette.   Final Diagnosis performed by Tabitha Lemma, MD.   Electronically signed 10/06/2017 12:32:24PM The electronic signature indicates that the named Attending Pathologist has evaluated the specimen  Technical component performed at [facility], [address] [place], [place] [address]  Lab: [id number] Dir: Tabitha Farmer, MD, MMM  Professional component performed at [facility], [facility], [place], [place],  [address] Lab: [id number] Dir: Tabitha Evans. Reuel Derby, MD       Assessment & Plan:   Problem List Items Addressed This Visit      Endocrine   Controlled type 2 diabetes with renal manifestation ([place]) - Primary    Doing well and feeling good! A1c better at . Continue diet and exercise ans metformin. Call with any concerns.       Relevant Orders   Bayer DCA Hb A1c Waived     Other   Anxiety disorder    Stable at current regimen. Does not want to change medicine. Continue current regimen. Continue to monitor. Call with any concerns.       Obesity (BMI 30.0-34.9)    Doing so much better! Down 17lbs! No longer considered morbidly obese. Will continue belviq and change obesity on her problem list. Call with any concerns.        Other Visit Diagnoses    Needs flu shot       Flu shot given today.   Relevant Orders   Flu Vaccine QUAD 6+ mos PF IM (Fluarix Quad PF) (Completed)       Follow up plan: Return in about 6 months (around 06/08/2018) for Physical.

## 2017-12-07 NOTE — Assessment & Plan Note (Signed)
Doing well and feeling good! A1c better at . Continue diet and exercise ans metformin. Call with any concerns.

## 2017-12-07 NOTE — Assessment & Plan Note (Signed)
Stable at current regimen. Does not want to change medicine. Continue current regimen. Continue to monitor. Call with any concerns.

## 2017-12-07 NOTE — Patient Instructions (Signed)

## 2017-12-07 NOTE — Assessment & Plan Note (Signed)
Doing so much better! Down 17lbs! No longer considered morbidly obese. Will continue belviq and change obesity on her problem list. Call with any concerns.

## 2018-01-04 ENCOUNTER — Encounter: Payer: Self-pay | Admitting: Gastroenterology

## 2018-01-04 ENCOUNTER — Ambulatory Visit: Payer: BLUE CROSS/BLUE SHIELD | Admitting: Gastroenterology

## 2018-01-04 VITALS — BP 137/82 | HR 87 | Ht 62.0 in | Wt 186.2 lb

## 2018-01-04 DIAGNOSIS — T189XXA Foreign body of alimentary tract, part unspecified, initial encounter: Secondary | ICD-10-CM | POA: Diagnosis not present

## 2018-01-04 DIAGNOSIS — K259 Gastric ulcer, unspecified as acute or chronic, without hemorrhage or perforation: Secondary | ICD-10-CM

## 2018-01-04 MED ORDER — FAMOTIDINE 20 MG PO TABS: 20.0000 mg | ORAL_TABLET | Freq: Every day | ORAL | 1 refills | Status: DC

## 2018-01-04 NOTE — Progress Notes (Signed)
Tabitha Antigua, MD [address]  [place]  [place], [place] [address]  Main: [phone]  Fax: [phone]   Primary Care Physician: Valerie Roys, DO  Primary Gastroenterologist:  Dr. Vonda Evans  Chief Complaint  Patient presents with  . Follow-up    3 month f/u GI Bleed. Pt states she is doing well.    HPI: Tabitha Evans is a 57 y.o. female here for follow-up of gastric ulcer and GI bleed in January 2019. The patient denies abdominal or flank pain, anorexia, nausea or vomiting, dysphagia, change in bowel habits or black or bloody stools or weight loss.  She remains on once daily omeprazole, as she reports heartburn without the medication.  Patient states she has been on it for years.  Denies any dysphagia.  Denies any further NSAID use  Previous history: EGD January 2019 Done for GI bleed in the setting of NSAID use  1 using superficial gastric ulcer found in the gastric body.  Treated with hemoclips. Gastric erythema seen patient placed on PPI twice daily  EGD August 2019 Irregular Z line.  Salmon-colored mucosa was not about 1 cm, therefore as per guidelines did not need to be biopsied. Previous ulcer site was seen, with no bleeding.  However, 2-4 clips placed were still present. Gastric erythema 3 to 4 mm gastric body polyps.  Pathology negative for H. pylori.  Stomach polyps showed benign fundic gland polyps.  Colonoscopy August 2019 Diverticulosis noted Repeat recommended in 10 years  Current Outpatient Medications  Medication Sig Dispense Refill  . albuterol (PROVENTIL HFA;VENTOLIN HFA) 108 (90 Base) MCG/ACT inhaler Inhale 2 puffs into the lungs every 6 (six) hours as needed for wheezing or shortness of breath. 1 Inhaler 2  . fluticasone (FLONASE) 50 MCG/ACT nasal spray Place 2 sprays into both nostrils daily. 16 g 6  . Lorcaserin HCl (BELVIQ) 10 MG TABS Take 1 tablet by mouth 2 (two) times daily. 180 tablet 1  . metFORMIN  (GLUCOPHAGE-XR) 500 MG 24 hr tablet TAKE 1 TABLET BY MOUTH ONCE DAILY WITH BREAKFAST 90 tablet 1  . sertraline (ZOLOFT) 100 MG tablet Take 1.5 tablets (150 mg total) by mouth daily. 135 tablet 1  . triamterene-hydrochlorothiazide (MAXZIDE-25) 37.5-25 MG tablet Take 1 tablet by mouth daily. 90 tablet 1  . ferrous sulfate 325 (65 FE) MG tablet Take 325 mg by mouth daily with breakfast.     No current facility-administered medications for this visit.     Allergies as of 01/04/2018  . (No Known Allergies)    ROS:  General: Negative for anorexia, weight loss, fever, chills, fatigue, weakness. ENT: Negative for hoarseness, difficulty swallowing , nasal congestion. CV: Negative for chest pain, angina, palpitations, dyspnea on exertion, peripheral edema.  Respiratory: Negative for dyspnea at rest, dyspnea on exertion, cough, sputum, wheezing.  GI: See history of present illness. GU:  Negative for dysuria, hematuria, urinary incontinence, urinary frequency, nocturnal urination.  Endo: Negative for unusual weight change.    Physical Examination:   BP 137/82   Pulse 87   Ht 5\' 2"  (1.575 m)   Wt 186 lb 3.2 oz (84.5 kg)   LMP 09/01/2014 (Approximate)   BMI 34.06 kg/m   General: Well-nourished, well-developed in no acute distress.  Eyes: No icterus. Conjunctivae pink. Mouth: Oropharyngeal mucosa moist and pink , no lesions erythema or exudate. Neck: Supple, Trachea midline Abdomen: Bowel sounds are normal, nontender, nondistended, no hepatosplenomegaly or masses, no abdominal bruits or hernia , no rebound or  guarding.   Extremities: No lower extremity edema. No clubbing or deformities. Neuro: Alert and oriented x 3.  Grossly intact. Skin: Warm and dry, no jaundice.   Psych: Alert and cooperative, normal mood and affect.   Labs: CMP     Component Value Date/Time   NA 141 09/02/2017 0930   NA 136 11/28/2013 1745   K 3.8 09/02/2017 0930   K 3.1 (L) 11/28/2013 1745   CL 101  09/02/2017 0930   CL 100 11/28/2013 1745   CO2 23 09/02/2017 0930   CO2 26 11/28/2013 1745   GLUCOSE 118 (H) 09/02/2017 0930   GLUCOSE 166 (H) 02/27/2017 0440   GLUCOSE 118 (H) 11/28/2013 1745   BUN 11 09/02/2017 0930   BUN 13 11/28/2013 1745   CREATININE 0.61 09/02/2017 0930   CREATININE 0.84 11/28/2013 1745   CALCIUM 9.5 09/02/2017 0930   CALCIUM 9.0 11/28/2013 1745   PROT 7.3 09/02/2017 0930   ALBUMIN 4.3 09/02/2017 0930   AST 51 (H) 09/02/2017 0930   ALT 50 (H) 09/02/2017 0930   ALKPHOS 99 09/02/2017 0930   BILITOT 0.3 09/02/2017 0930   GFRNONAA 103 09/02/2017 0930   GFRNONAA >60 11/28/2013 1745   GFRAA 119 09/02/2017 0930   GFRAA >60 11/28/2013 1745   Lab Results  Component Value Date   WBC 6.4 09/02/2017   HGB 13.0 09/02/2017   HCT 39.9 09/02/2017   MCV 84 09/02/2017   PLT 304 09/02/2017    Imaging Studies: No results found.  Assessment and Plan:   MAHLI GLAHN is a 57 y.o. y/o female with history of gastric ulcer in the setting of NSAID use in January 2019  No further GI bleed No further NSAID use.  Continue to avoid  We will order abdominal x-ray to ensure the clips at the gastric ulcer sites have fallen off If they have, we can then order an EGD to ensure that the previous ulcer site has completely healed. If the clips are still present, will need to wait for the EGD  Patient agreeable with the above plan  We have also discussed discontinuing her omeprazole and transitioning to Pepcid to prevent side effects associated with PPI. Patient is willing to try this We will send Pepcid to the pharmacy.  Patient educated extensively on acid reflux lifestyle modification, including buying a bed wedge, not eating 3 hrs before bedtime, diet modifications, and handout given for the same.     Dr Tabitha Evans

## 2018-03-19 ENCOUNTER — Other Ambulatory Visit: Payer: Self-pay | Admitting: Family Medicine

## 2018-03-21 NOTE — Telephone Encounter (Signed)
Requested Prescriptions  Pending Prescriptions Disp Refills  . metFORMIN (GLUCOPHAGE-XR) 500 MG 24 hr tablet [Pharmacy Med Name: metFORMIN HCl ER 500 MG Oral Tablet Extended Release 24 Hour] 90 tablet 0    Sig: TAKE 1 TABLET BY MOUTH ONCE DAILY WITH  BREAKFAST     Endocrinology:  Diabetes - Biguanides Passed - 03/19/2018  9:58 AM      Passed - Cr in normal range and within 360 days    Creatinine  Date Value Ref Range Status  11/28/2013 0.84 0.60 - 1.30 mg/dL Final   Creatinine, Ser  Date Value Ref Range Status  09/02/2017 0.61 0.57 - 1.00 mg/dL Final         Passed - HBA1C is between 0 and 7.9 and within 180 days    Hemoglobin A1C  Date Value Ref Range Status  09/01/2016 6.1  Final   HB A1C (BAYER DCA - WAIVED)  Date Value Ref Range Status  12/07/2017 6.3 <7.0 % Final    Comment:                                          Diabetic Adult            <7.0                                       Healthy Adult        4.3 - 5.7                                                           (DCCT/NGSP) American Diabetes Association's Summary of Glycemic Recommendations for Adults with Diabetes: Hemoglobin A1c <7.0%. More stringent glycemic goals (A1c <6.0%) may further reduce complications at the cost of increased risk of hypoglycemia.          Passed - eGFR in normal range and within 360 days    EGFR (African American)  Date Value Ref Range Status  11/28/2013 >60 >52m/min Final   GFR calc Af Amer  Date Value Ref Range Status  09/02/2017 119 >59 mL/min/1.73 Final   EGFR (Non-African Amer.)  Date Value Ref Range Status  11/28/2013 >60 >626mmin Final    Comment:    eGFR values <6051min/1.73 m2 may be an indication of chronic kidney disease (CKD). Calculated eGFR, using the MRDR Study equation, is useful in  patients with stable renal function. The eGFR calculation will not be reliable in acutely ill patients when serum creatinine is changing rapidly. It is not useful in patients  on dialysis. The eGFR calculation may not be applicable to patients at the low and high extremes of body sizes, pregnant women, and vegetarians.    GFR calc non Af Amer  Date Value Ref Range Status  09/02/2017 103 >59 mL/min/1.73 Final         Passed - Valid encounter within last 6 months    Recent Outpatient Visits          3 months ago Controlled type 2 diabetes mellitus with stage 3 chronic kidney disease, without long-term current use of insulin [place]  [place], [place], [place]   6 months ago Controlled type 2 diabetes mellitus with stage 3 chronic kidney disease, without long-term current use of insulin ([place])   [place], Megan P, DO   9 months ago Cough   [place], Megan P, DO   10 months ago [place], [place], DO   1 year ago Routine general medical examination at a health care facility   [facility], Barb Merino, DO      Future Appointments            In 2 months Johnson, Megan P, DO [place], [facility]         . triamterene-hydrochlorothiazide (MAXZIDE-25) 37.5-25 MG tablet [Pharmacy Med Name: Triamterene-HCTZ 37.5-25 MG Oral Tablet] 90 tablet 0    Sig: TAKE 1 TABLET BY MOUTH ONCE DAILY     Cardiovascular: Diuretic Combos Passed - 03/19/2018  9:58 AM      Passed - K in normal range and within 360 days    Potassium  Date Value Ref Range Status  09/02/2017 3.8 3.5 - 5.2 mmol/L Final  11/28/2013 3.1 (L) 3.5 - 5.1 mmol/L Final         Passed - Na in normal range and within 360 days    Sodium  Date Value Ref Range Status  09/02/2017 141 134 - 144 mmol/L Final  11/28/2013 136 136 - 145 mmol/L Final         Passed - Cr in normal range and within 360 days    Creatinine  Date Value Ref Range Status  11/28/2013 0.84 0.60 - 1.30 mg/dL Final   Creatinine, Ser  Date Value Ref Range Status  09/02/2017 0.61 0.57 - 1.00 mg/dL Final          Passed - Ca in normal range and within 360 days    Calcium  Date Value Ref Range Status  09/02/2017 9.5 8.7 - 10.2 mg/dL Final   Calcium, Total  Date Value Ref Range Status  11/28/2013 9.0 8.5 - 10.1 mg/dL Final         Passed - Last BP in normal range    BP Readings from Last 1 Encounters:  01/04/18 137/82         Passed - Valid encounter within last 6 months    Recent Outpatient Visits          3 months ago Controlled type 2 diabetes mellitus with stage 3 chronic kidney disease, without long-term current use of insulin ([place])   [facility] [place], Megan P, DO   6 months ago Controlled type 2 diabetes mellitus with stage 3 chronic kidney disease, without long-term current use of insulin ([place])   [place], [place], DO   9 months ago Cough   [employer], [place], DO   10 months ago [place], Megan P, DO   1 year ago Routine general medical examination at a health care facility   [place], Barb Merino, DO      Future Appointments            In 2 months Wynetta Emery, Barb Merino, DO [employer], [place]

## 2018-04-06 ENCOUNTER — Other Ambulatory Visit: Payer: Self-pay | Admitting: Family Medicine

## 2018-05-22 ENCOUNTER — Other Ambulatory Visit: Payer: Self-pay | Admitting: Family Medicine

## 2018-06-10 ENCOUNTER — Other Ambulatory Visit: Payer: Self-pay | Admitting: Family Medicine

## 2018-06-14 ENCOUNTER — Encounter: Payer: BLUE CROSS/BLUE SHIELD | Admitting: Family Medicine

## 2018-06-21 ENCOUNTER — Other Ambulatory Visit: Payer: Self-pay | Admitting: Family Medicine

## 2018-06-21 NOTE — Telephone Encounter (Signed)
Requested medication (s) are due for refill today: yes  Requested medication (s) are on the active medication list: yes  Last refill:  03/21/2018  Future visit scheduled: No  Notes to clinic:  Needs appointment    Requested Prescriptions  Pending Prescriptions Disp Refills   metFORMIN (GLUCOPHAGE-XR) 500 MG 24 hr tablet [Pharmacy Med Name: metFORMIN HCl ER 500 MG Oral Tablet Extended Release 24 Hour] 90 tablet 0    Sig: TAKE 1 TABLET BY [place]     Endocrinology:  Diabetes - Biguanides Failed - 06/21/2018 10:12 AM      Failed - HBA1C is between 0 and 7.9 and within 180 days    Hemoglobin A1C  Date Value Ref Range Status  09/01/2016 6.1  Final   HB A1C (BAYER DCA - WAIVED)  Date Value Ref Range Status  12/07/2017 6.3 <7.0 % Final    Comment:                                          Diabetic Adult            <7.0                                       Healthy Adult        4.3 - 5.7                                                           (DCCT/NGSP) American Diabetes Association's Summary of Glycemic Recommendations for Adults with Diabetes: Hemoglobin A1c <7.0%. More stringent glycemic goals (A1c <6.0%) may further reduce complications at the cost of increased risk of hypoglycemia.          Failed - Valid encounter within last 6 months    Recent Outpatient Visits          6 months ago Controlled type 2 diabetes mellitus with stage 3 chronic kidney disease, without long-term current use of insulin ([place])   [place], Megan P, DO   9 months ago Controlled type 2 diabetes mellitus with stage 3 chronic kidney disease, without long-term current use of insulin ([place])   [place], [place], DO   1 year ago Cough   [place], [place], DO   1 year ago [place], [place], DO   1 year ago Routine general medical examination at a health care facility   [facility], [place] P, DO             Passed - Cr in normal range and within 360 days    Creatinine  Date Value Ref Range Status  11/28/2013 0.84 0.60 - 1.30 mg/dL Final   Creatinine, Ser  Date Value Ref Range Status  09/02/2017 0.61 0.57 - 1.00 mg/dL Final         Passed - eGFR in normal range and within 360 days    EGFR (African American)  Date Value Ref Range Status  11/28/2013 >60 >31m/min Final   GFR calc Af AWyvonnia Lora  Date Value Ref Range Status  09/02/2017 119 >59 mL/min/1.73 Final   EGFR (Non-African Amer.)  Date Value Ref Range Status  11/28/2013 >60 >25m/min Final    Comment:    eGFR values <626mmin/1.73 m2 may be an indication of chronic kidney disease (CKD). Calculated eGFR, using the MRDR Study equation, is useful in  patients with stable renal function. The eGFR calculation will not be reliable in acutely ill patients when serum creatinine is changing rapidly. It is not useful in patients on dialysis. The eGFR calculation may not be applicable to patients at the low and high extremes of body sizes, pregnant women, and vegetarians.    GFR calc non Af Amer  Date Value Ref Range Status  09/02/2017 103 >59 mL/min/1.73 Final

## 2018-06-21 NOTE — Telephone Encounter (Signed)
Needs follow up appointment.  

## 2018-06-23 ENCOUNTER — Other Ambulatory Visit: Payer: Self-pay

## 2018-06-23 ENCOUNTER — Encounter: Payer: Self-pay | Admitting: Family Medicine

## 2018-06-23 ENCOUNTER — Ambulatory Visit (INDEPENDENT_AMBULATORY_CARE_PROVIDER_SITE_OTHER): Payer: BLUE CROSS/BLUE SHIELD | Admitting: Family Medicine

## 2018-06-23 VITALS — Temp 97.2°F | Wt 190.0 lb

## 2018-06-23 DIAGNOSIS — N183 Chronic kidney disease, stage 3 (moderate): Secondary | ICD-10-CM

## 2018-06-23 DIAGNOSIS — I129 Hypertensive chronic kidney disease with stage 1 through stage 4 chronic kidney disease, or unspecified chronic kidney disease: Secondary | ICD-10-CM

## 2018-06-23 DIAGNOSIS — F411 Generalized anxiety disorder: Secondary | ICD-10-CM

## 2018-06-23 DIAGNOSIS — E1122 Type 2 diabetes mellitus with diabetic chronic kidney disease: Secondary | ICD-10-CM | POA: Diagnosis not present

## 2018-06-23 MED ORDER — TRIAMTERENE-HCTZ 37.5-25 MG PO TABS: 1.0000 | ORAL_TABLET | Freq: Every day | ORAL | 1 refills | Status: DC

## 2018-06-23 MED ORDER — SERTRALINE HCL 100 MG PO TABS: 150.0000 mg | ORAL_TABLET | Freq: Every day | ORAL | 1 refills | Status: DC

## 2018-06-23 MED ORDER — OMEPRAZOLE 40 MG PO CPDR: 40.0000 mg | DELAYED_RELEASE_CAPSULE | Freq: Every day | ORAL | 1 refills | Status: DC

## 2018-06-23 MED ORDER — METFORMIN HCL ER 500 MG PO TB24: 500.0000 mg | ORAL_TABLET | Freq: Every day | ORAL | 1 refills | Status: DC

## 2018-06-23 NOTE — Assessment & Plan Note (Signed)
Under good control on current regimen. Continue current regimen. Continue to monitor. Call with any concerns. Refills given. Will get her in for labs.

## 2018-06-23 NOTE — Progress Notes (Signed)
Temp (!) 97.2 F (36.2 C)   Wt 190 lb (86.2 kg)   LMP 09/01/2014 (Approximate)   BMI 34.75 kg/m    Subjective:    Patient ID: Tabitha Evans, female    DOB: 11-18-1960, 58 y.o.   MRN: [id number]  HPI: Tabitha Evans is a 58 y.o. female  Chief Complaint  Patient presents with  . Anxiety  . Depression  . Hypertension  . Diabetes   DIABETES Hypoglycemic episodes:no Polydipsia/polyuria: no Visual disturbance: no Chest pain: no Paresthesias: no Glucose Monitoring: no  Accucheck frequency: Not Checking Taking Insulin?: no Blood Pressure Monitoring: not checking Retinal Examination: Not up to Date Foot Exam: Not up to Date Diabetic Education: Completed Pneumovax: Up to Date Influenza: Up to Date Aspirin: no  HYPERTENSION Hypertension status: controlled  Satisfied with current treatment? no Duration of hypertension: chronic BP monitoring frequency:  not checking BP medication side effects:  no Medication compliance: excellent compliance Previous BP meds:triamterine-HCTZ Aspirin: no Recurrent headaches: no Visual changes: no Palpitations: no Dyspnea: no Chest pain: no Lower extremity edema: no Dizzy/lightheaded: no  ANXIETY/DEPRESSION Duration:controlled Anxious mood: yes  Excessive worrying: no Irritability: no  Sweating: no Nausea: no Palpitations:no Hyperventilation: no Panic attacks: no Agoraphobia: no  Obscessions/compulsions: no Depressed mood: no Depression screen [facility] 2/9 06/23/2018 12/07/2017 09/02/2017 03/04/2017 03/04/2017  Decreased Interest 0 1 1 0 0  Down, Depressed, Hopeless 0 0 0 0 0  PHQ - 2 Score 0 1 1 0 0  Altered sleeping 0 3 3 3 3   Tired, decreased energy 3 1 2 2 2   Change in appetite 3 0 2 2 2   Feeling bad or failure about yourself  3 0 0 0 0  Trouble concentrating 0 0 2 2 2   Moving slowly or fidgety/restless 1 3 3 2 2   Suicidal thoughts 0 0 0 0 0  PHQ-9 Score 10 8 13 11 11   Difficult doing work/chores Not difficult at all  Somewhat difficult Somewhat difficult Somewhat difficult Somewhat difficult   GAD 7 : Generalized Anxiety Score 06/23/2018 12/07/2017 09/02/2017 08/22/2015  Nervous, Anxious, on Edge 2 1 0 1  Control/stop worrying 2 2 1 3   Worry too much - different things 2 1 1 3   Trouble relaxing 0 0 1 1  Restless 0 0 1 0  Easily annoyed or irritable 2 0 1 0  Afraid - awful might happen 0 0 1 0  Total GAD 7 Score 8 4 6 8   Anxiety Difficulty Not difficult at all Not difficult at all Not difficult at all Not difficult at all   Anhedonia: no Weight changes: no Insomnia: no   Hypersomnia: no Fatigue/loss of energy: no Feelings of worthlessness: no Feelings of guilt: no Impaired concentration/indecisiveness: no Suicidal ideations: no  Crying spells: no Recent Stressors/Life Changes: yes   Relationship problems: no   Family stress: no     Financial stress: no    Job stress: no    Recent death/loss: no   Relevant past medical, surgical, family and social history reviewed and updated as indicated. Interim medical history since our last visit reviewed. Allergies and medications reviewed and updated.  Review of Systems  Constitutional: Negative.   Respiratory: Negative.   Cardiovascular: Negative.   Gastrointestinal: Negative.   Musculoskeletal: Negative.   Neurological: Negative.   Psychiatric/Behavioral: Negative.     Per HPI unless specifically indicated above     Objective:    Temp (!) 97.2 F (36.2 C)   Wt 190  lb (86.2 kg)   LMP 09/01/2014 (Approximate)   BMI 34.75 kg/m   Wt Readings from Last 3 Encounters:  06/23/18 190 lb (86.2 kg)  01/04/18 186 lb 3.2 oz (84.5 kg)  12/07/17 183 lb (83 kg)    Physical Exam Vitals signs and nursing note reviewed.  Constitutional:      General: She is not in acute distress.    Appearance: Normal appearance. She is not ill-appearing, toxic-appearing or diaphoretic.  HENT:     Head: Normocephalic and atraumatic.     Right Ear: External ear  normal.     Left Ear: External ear normal.     Nose: Nose normal.     Mouth/Throat:     Mouth: Mucous membranes are moist.     Pharynx: Oropharynx is clear.  Eyes:     General: No scleral icterus.       Right eye: No discharge.        Left eye: No discharge.     Conjunctiva/sclera: Conjunctivae normal.     Pupils: Pupils are equal, round, and reactive to light.  Neck:     Musculoskeletal: Normal range of motion.  Pulmonary:     Effort: Pulmonary effort is normal. No respiratory distress.     Comments: Speaking in full sentences Musculoskeletal: Normal range of motion.  Skin:    Coloration: Skin is not jaundiced or pale.     Findings: No bruising, erythema, lesion or rash.  Neurological:     Mental Status: She is alert and oriented to person, place, and time. Mental status is at baseline.  Psychiatric:        Mood and Affect: Mood normal.        Behavior: Behavior normal.        Thought Content: Thought content normal.        Judgment: Judgment normal.     Results for orders placed or performed in visit on 12/07/17  Bayer DCA Hb A1c Waived  Result Value Ref Range   HB A1C (BAYER DCA - WAIVED) 6.3 <7.0 %      Assessment & Plan:   Problem List Items Addressed This Visit      Endocrine   Controlled type 2 diabetes with renal manifestation ([place]) - Primary    Under good control on current regimen. Continue current regimen. Continue to monitor. Call with any concerns. Refills given. Will get her in for labs.        Relevant Medications   metFORMIN (GLUCOPHAGE-XR) 500 MG 24 hr tablet   Other Relevant Orders   Bayer DCA Hb A1c Waived   CBC with Differential/Platelet   Comprehensive metabolic panel   Lipid Panel w/o Chol/HDL Ratio   Microalbumin, Urine Waived   TSH   UA/M w/rflx Culture, Routine     Genitourinary   Benign hypertensive renal disease    Under good control on current regimen. Continue current regimen. Continue to monitor. Call with any concerns. Refills  given. Will get her in for labs.        Relevant Orders   CBC with Differential/Platelet   Comprehensive metabolic panel   Microalbumin, Urine Waived   TSH   UA/M w/rflx Culture, Routine     Other   Anxiety disorder    Under good control on current regimen. Continue current regimen. Continue to monitor. Call with any concerns. Refills given. Will get her in for labs.        Relevant Medications   sertraline (ZOLOFT) 100 MG  tablet   Other Relevant Orders   CBC with Differential/Platelet   Comprehensive metabolic panel   TSH   UA/M w/rflx Culture, Routine       Follow up plan: Return in about 6 months (around 12/24/2018).   . This visit was completed via FaceTime due to the restrictions of the COVID-19 pandemic. All issues as above were discussed and addressed. Physical exam was done as above through visual confirmation on FaceTime. If it was felt that the patient should be evaluated in the office, they were directed there. The patient verbally consented to this visit. . Location of the patient: home . Location of the provider: home . Those involved with this call:  . Provider: Park Liter, DO . CMA: Tiffany Reel, CMA . Front Desk/Registration: Don Perking  . Time spent on call: 25 minutes with patient face to face via video conference. More than 50% of this time was spent in counseling and coordination of care. 40 minutes total spent in review of patient's record and preparation of their chart.

## 2018-06-28 ENCOUNTER — Other Ambulatory Visit: Payer: Self-pay

## 2018-06-28 ENCOUNTER — Other Ambulatory Visit: Payer: BLUE CROSS/BLUE SHIELD

## 2018-06-28 DIAGNOSIS — F411 Generalized anxiety disorder: Secondary | ICD-10-CM

## 2018-06-28 DIAGNOSIS — I129 Hypertensive chronic kidney disease with stage 1 through stage 4 chronic kidney disease, or unspecified chronic kidney disease: Secondary | ICD-10-CM

## 2018-06-28 DIAGNOSIS — E1122 Type 2 diabetes mellitus with diabetic chronic kidney disease: Secondary | ICD-10-CM

## 2018-06-28 DIAGNOSIS — N183 Chronic kidney disease, stage 3 unspecified: Secondary | ICD-10-CM

## 2018-06-28 LAB — UA/M W/RFLX CULTURE, ROUTINE
Bilirubin, UA: NEGATIVE
Glucose, UA: NEGATIVE
Ketones, UA: NEGATIVE
Leukocytes,UA: NEGATIVE
Nitrite, UA: NEGATIVE
RBC, UA: NEGATIVE
Specific Gravity, UA: 1.02 (ref 1.005–1.030)
Urobilinogen, Ur: 4 mg/dL — ABNORMAL HIGH (ref 0.2–1.0)
pH, UA: 7 (ref 5.0–7.5)

## 2018-06-28 LAB — MICROSCOPIC EXAMINATION
Bacteria, UA: NONE SEEN
RBC, Urine: NONE SEEN /hpf (ref 0–2)
WBC, UA: NONE SEEN /hpf (ref 0–5)

## 2018-06-28 LAB — MICROALBUMIN, URINE WAIVED
Creatinine, Urine Waived: 100 mg/dL (ref 10–300)
Microalb, Ur Waived: 80 mg/L — ABNORMAL HIGH (ref 0–19)

## 2018-06-28 LAB — BAYER DCA HB A1C WAIVED: HB A1C (BAYER DCA - WAIVED): 6.3 % (ref <7.0)

## 2018-06-29 LAB — COMPREHENSIVE METABOLIC PANEL
ALT: 30 IU/L (ref 0–32)
AST: 30 IU/L (ref 0–40)
Albumin/Globulin Ratio: 1.6 (ref 1.2–2.2)
Albumin: 4.4 g/dL (ref 3.8–4.9)
Alkaline Phosphatase: 89 IU/L (ref 39–117)
BUN/Creatinine Ratio: 19 (ref 9–23)
BUN: 12 mg/dL (ref 6–24)
Bilirubin Total: 0.2 mg/dL (ref 0.0–1.2)
CO2: 25 mmol/L (ref 20–29)
Calcium: 9.4 mg/dL (ref 8.7–10.2)
Chloride: 101 mmol/L (ref 96–106)
Creatinine, Ser: 0.64 mg/dL (ref 0.57–1.00)
GFR calc Af Amer: 116 mL/min/{1.73_m2} (ref >59)
GFR calc non Af Amer: 101 mL/min/{1.73_m2} (ref >59)
Globulin, Total: 2.7 g/dL (ref 1.5–4.5)
Glucose: 107 mg/dL — ABNORMAL HIGH (ref 65–99)
Potassium: 4.2 mmol/L (ref 3.5–5.2)
Sodium: 141 mmol/L (ref 134–144)
Total Protein: 7.1 g/dL (ref 6.0–8.5)

## 2018-06-29 LAB — LIPID PANEL W/O CHOL/HDL RATIO
Cholesterol, Total: 217 mg/dL — ABNORMAL HIGH (ref 100–199)
HDL: 53 mg/dL (ref >39)
LDL Calculated: 146 mg/dL — ABNORMAL HIGH (ref 0–99)
Triglycerides: 89 mg/dL (ref 0–149)
VLDL Cholesterol Cal: 18 mg/dL (ref 5–40)

## 2018-06-29 LAB — CBC WITH DIFFERENTIAL/PLATELET
Basophils Absolute: 0 10*3/uL (ref 0.0–0.2)
Basos: 1 %
EOS (ABSOLUTE): 0.2 10*3/uL (ref 0.0–0.4)
Eos: 3 %
Hematocrit: 38.6 % (ref 34.0–46.6)
Hemoglobin: 13.1 g/dL (ref 11.1–15.9)
Immature Grans (Abs): 0 10*3/uL (ref 0.0–0.1)
Immature Granulocytes: 0 %
Lymphocytes Absolute: 2 10*3/uL (ref 0.7–3.1)
Lymphs: 30 %
MCH: 27.7 pg (ref 26.6–33.0)
MCHC: 33.9 g/dL (ref 31.5–35.7)
MCV: 82 fL (ref 79–97)
Monocytes Absolute: 0.5 10*3/uL (ref 0.1–0.9)
Monocytes: 8 %
Neutrophils Absolute: 3.8 10*3/uL (ref 1.4–7.0)
Neutrophils: 58 %
Platelets: 294 10*3/uL (ref 150–450)
RBC: 4.73 x10E6/uL (ref 3.77–5.28)
RDW: 14.3 % (ref 11.7–15.4)
WBC: 6.5 10*3/uL (ref 3.4–10.8)

## 2018-06-29 LAB — TSH: TSH: 3.78 u[IU]/mL (ref 0.450–4.500)

## 2018-07-29 ENCOUNTER — Encounter: Payer: Self-pay | Admitting: Family Medicine

## 2018-08-10 ENCOUNTER — Telehealth: Payer: Self-pay | Admitting: Family Medicine

## 2018-08-10 NOTE — Telephone Encounter (Signed)
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 She denies all symptoms/asymptomatic. Hx DM2 and lives with her elderly mother. Please advise if test can be ordered. Please inform her via MyChart.Reviewed precautions to continue including wearing mask when around others or in public/continue frequent hand washing/disinfect common areas at work and home daily. Monitor for fever/SOB/cough/congestion/fatigue/body aches/loss of taste/smell/congestion and runny nose. Stated she understood and will check MyChart for a response from physician.

## 2018-08-10 NOTE — Telephone Encounter (Signed)
Please set up virtual visit

## 2018-08-11 ENCOUNTER — Telehealth: Payer: Self-pay | Admitting: *Deleted

## 2018-08-11 ENCOUNTER — Other Ambulatory Visit: Payer: Self-pay

## 2018-08-11 ENCOUNTER — Encounter: Payer: Self-pay | Admitting: Nurse Practitioner

## 2018-08-11 ENCOUNTER — Ambulatory Visit (INDEPENDENT_AMBULATORY_CARE_PROVIDER_SITE_OTHER): Payer: BLUE CROSS/BLUE SHIELD | Admitting: Nurse Practitioner

## 2018-08-11 DIAGNOSIS — Z20822 Contact with and (suspected) exposure to covid-19: Secondary | ICD-10-CM

## 2018-08-11 DIAGNOSIS — Z20828 Contact with and (suspected) exposure to other viral communicable diseases: Secondary | ICD-10-CM | POA: Diagnosis not present

## 2018-08-11 NOTE — Progress Notes (Signed)
Temp (!) 96.6 F (35.9 C) (Temporal)   Ht 5\' 4"  (1.626 m)   Wt 191 lb (86.6 kg)   LMP 09/01/2014 (Approximate)   BMI 32.79 kg/m    Subjective:    Patient ID: Tabitha Evans, female    DOB: 1960/06/04, 58 y.o.   MRN: [id number]  HPI: Tabitha Evans is a 58 y.o. female  Chief Complaint  Patient presents with  . COVID Exposure    Direct contact with +COVID yesterday from coworker. No symptoms.    . This visit was completed via telephone due to the restrictions of the COVID-19 pandemic. All issues as above were discussed and addressed but no physical exam was performed. If it was felt that the patient should be evaluated in the office, they were directed there. The patient verbally consented to this visit. Patient was unable to complete an audio/visual visit due to Technical difficulties,Lack of internet. Due to the catastrophic nature of the COVID-19 pandemic, this visit was done through audio contact only. . Location of the patient: home . Location of the provider: home . Those involved with this call:  . Provider: Marnee Guarneri, DNP . CMA: Merilyn Baba, CMA . Front Desk/Registration: Jill Side  . Time spent on call: 15 minutes on the phone discussing health concerns. 10 minutes total spent in review of patient's record and preparation of their chart. I verified patient identity using two factors (patient name and date of birth). Patient consents verbally to being seen via telemedicine visit today.   COVID EXPOSURE, direct contact Works at [employer], coworker came in and told her he was positive.  He did not have symptoms at time, had issues with knee and they tested him when he was being seen per patient report.  They did a blood test.  She is requesting testing due to exposure.  Discussed with patient that since exposure was recent testing may return negative at this time, she still wishes to obtain.  Discussed at length with her symptoms to monitor for  and recommend self quarantine until results returned, if results negative may return to work but is to still monitor for symptoms and wear mask + social distance.  If symptoms present she is to notify providers immediately and self quarantine.  Denies symptoms at this time, including loss of taste.   Fever: no Cough: no Shortness of breath: no Wheezing: no Chest pain: no Chest tightness: no Chest congestion: no Nasal congestion: no Runny nose: no Post nasal drip: no Sneezing: no Sore throat: no Swollen glands: no Sinus pressure: no Headache: no Face pain: no Toothache: no Ear pain: none Ear pressure: none Eyes red/itching:no Eye drainage/crusting: no  Vomiting: no Rash: no Fatigue: no Sick contacts: no  Relevant past medical, surgical, family and social history reviewed and updated as indicated. Interim medical history since our last visit reviewed. Allergies and medications reviewed and updated.  Review of Systems  Constitutional: Negative for activity change, appetite change, fatigue and fever.  HENT: Negative for congestion, ear discharge, ear pain, facial swelling, postnasal drip, rhinorrhea, sinus pressure, sinus pain, sneezing, sore throat and voice change.   Eyes: Negative for pain and visual disturbance.  Respiratory: Negative for cough, chest tightness, shortness of breath and wheezing.   Cardiovascular: Negative for chest pain, palpitations and leg swelling.  Gastrointestinal: Negative for abdominal distention, abdominal pain, constipation, diarrhea, nausea and vomiting.  Endocrine: Negative.   Musculoskeletal: Negative for myalgias.  Neurological: Negative for dizziness, numbness and headaches.  Psychiatric/Behavioral: Negative.     Per HPI unless specifically indicated above     Objective:    Temp (!) 96.6 F (35.9 C) (Temporal)   Ht 5\' 4"  (1.626 m)   Wt 191 lb (86.6 kg)   LMP 09/01/2014 (Approximate)   BMI 32.79 kg/m   Wt Readings from Last 3  Encounters:  08/11/18 191 lb (86.6 kg)  06/23/18 190 lb (86.2 kg)  01/04/18 186 lb 3.2 oz (84.5 kg)    Physical Exam   Unable to perform due to telephone visit only with patient lack of equipment.  Results for orders placed or performed in visit on 06/28/18  Microscopic Examination   URINE  Result Value Ref Range   WBC, UA None seen 0 - 5 /hpf   RBC None seen 0 - 2 /hpf   Epithelial Cells (non renal) 0-10 0 - 10 /hpf   Bacteria, UA None seen None seen/Few  UA/M w/rflx Culture, Routine   Specimen: Urine   URINE  Result Value Ref Range   Specific Gravity, UA 1.020 1.005 - 1.030   pH, UA 7.0 5.0 - 7.5   Color, UA Yellow Yellow   Appearance Ur Clear Clear   Leukocytes,UA Negative Negative   Protein,UA Trace (A) Negative/Trace   Glucose, UA Negative Negative   Ketones, UA Negative Negative   RBC, UA Negative Negative   Bilirubin, UA Negative Negative   Urobilinogen, Ur 4.0 (H) 0.2 - 1.0 mg/dL   Nitrite, UA Negative Negative   Microscopic Examination See below:   TSH  Result Value Ref Range   TSH 3.780 0.450 - 4.500 uIU/mL  Microalbumin, Urine Waived  Result Value Ref Range   Microalb, Ur Waived 80 (H) 0 - 19 mg/L   Creatinine, Urine Waived 100 10 - 300 mg/dL   Microalb/Creat Ratio 30-300 (H) <30 mg/g  Lipid Panel w/o Chol/HDL Ratio  Result Value Ref Range   Cholesterol, Total 217 (H) 100 - 199 mg/dL   Triglycerides 89 0 - 149 mg/dL   HDL 53 >39 mg/dL   VLDL Cholesterol Cal 18 5 - 40 mg/dL   LDL Calculated 146 (H) 0 - 99 mg/dL  Comprehensive metabolic panel  Result Value Ref Range   Glucose 107 (H) 65 - 99 mg/dL   BUN 12 6 - 24 mg/dL   Creatinine, Ser 0.64 0.57 - 1.00 mg/dL   GFR calc non Af Amer 101 >59 mL/min/1.73   GFR calc Af Amer 116 >59 mL/min/1.73   BUN/Creatinine Ratio 19 9 - 23   Sodium 141 134 - 144 mmol/L   Potassium 4.2 3.5 - 5.2 mmol/L   Chloride 101 96 - 106 mmol/L   CO2 25 20 - 29 mmol/L   Calcium 9.4 8.7 - 10.2 mg/dL   Total Protein 7.1 6.0 -  8.5 g/dL   Albumin 4.4 3.8 - 4.9 g/dL   Globulin, Total 2.7 1.5 - 4.5 g/dL   Albumin/Globulin Ratio 1.6 1.2 - 2.2   Bilirubin Total 0.2 0.0 - 1.2 mg/dL   Alkaline Phosphatase 89 39 - 117 IU/L   AST 30 0 - 40 IU/L   ALT 30 0 - 32 IU/L  CBC with Differential/Platelet  Result Value Ref Range   WBC 6.5 3.4 - 10.8 x10E3/uL   RBC 4.73 3.77 - 5.28 x10E6/uL   Hemoglobin 13.1 11.1 - 15.9 g/dL   Hematocrit 38.6 34.0 - 46.6 %   MCV 82 79 - 97 fL   MCH 27.7 26.6 - 33.0 pg  MCHC 33.9 31.5 - 35.7 g/dL   RDW 14.3 11.7 - 15.4 %   Platelets 294 150 - 450 x10E3/uL   Neutrophils 58 Not Estab. %   Lymphs 30 Not Estab. %   Monocytes 8 Not Estab. %   Eos 3 Not Estab. %   Basos 1 Not Estab. %   Neutrophils Absolute 3.8 1.4 - 7.0 x10E3/uL   Lymphocytes Absolute 2.0 0.7 - 3.1 x10E3/uL   Monocytes Absolute 0.5 0.1 - 0.9 x10E3/uL   EOS (ABSOLUTE) 0.2 0.0 - 0.4 x10E3/uL   Basophils Absolute 0.0 0.0 - 0.2 x10E3/uL   Immature Granulocytes 0 Not Estab. %   Immature Grans (Abs) 0.0 0.0 - 0.1 x10E3/uL  Bayer DCA Hb A1c Waived  Result Value Ref Range   HB A1C (BAYER DCA - WAIVED) 6.3 <7.0 %      Assessment & Plan:   Problem List Items Addressed This Visit      Other   Exposure to Covid-19 Virus    Exposure at there workplace.  Message sent to [facility] team for outpatient testing.  Discussed with patient that since exposure was recent testing may return negative at this time, she still wishes to obtain.  Discussed at length with her symptoms to monitor for and recommend self quarantine until results returned, if results negative may return to work but is to still monitor for symptoms and wear mask + social distance.  If symptoms present she is to notify providers immediately and self quarantine.  If testing positive she is to self quarantine for 14 days.  Return in 2 weeks for Covid follow-up.           I discussed the assessment and treatment plan with the patient. The patient was provided an opportunity to  ask questions and all were answered. The patient agreed with the plan and demonstrated an understanding of the instructions.   The patient was advised to call back or seek an in-person evaluation if the symptoms worsen or if the condition fails to improve as anticipated.   I provided 15 minutes of time during this encounter.  Follow up plan: Return in about 2 weeks (around 08/25/2018) for Covid follow-up visit.

## 2018-08-11 NOTE — Assessment & Plan Note (Signed)
Exposure at there workplace.  Message sent to [facility] team for outpatient testing.  Discussed with patient that since exposure was recent testing may return negative at this time, she still wishes to obtain.  Discussed at length with her symptoms to monitor for and recommend self quarantine until results returned, if results negative may return to work but is to still monitor for symptoms and wear mask + social distance.  If symptoms present she is to notify providers immediately and self quarantine.  If testing positive she is to self quarantine for 14 days.  Return in 2 weeks for Covid follow-up.

## 2018-08-11 NOTE — Patient Instructions (Signed)

## 2018-08-11 NOTE — Telephone Encounter (Signed)
-----   Message from Venita Lick, NP sent at 08/11/2018  9:22 AM EDT ----- Name: Tabitha Evans  DOB: 12-25-1960  MRN: [id number]  Insurance: [employer]  Reason:  Patient with risk factors to include obesity and Type 2 Diabetes.  Recent exposure to Covid positive coworker at [employer]. Currently she has not symptoms, but is requesting testing due to her risk factors and health.

## 2018-08-11 NOTE — Telephone Encounter (Signed)
Spoke with patient, scheduled her for COVID 19 test today at [facility] at 11:15 am.  Testing protocol reviewed.

## 2018-08-13 LAB — NOVEL CORONAVIRUS, NAA: SARS-CoV-2, NAA: NOT DETECTED

## 2018-10-06 ENCOUNTER — Other Ambulatory Visit: Payer: Self-pay | Admitting: Family Medicine

## 2018-10-06 DIAGNOSIS — Z1231 Encounter for screening mammogram for malignant neoplasm of breast: Secondary | ICD-10-CM

## 2018-11-09 ENCOUNTER — Other Ambulatory Visit: Payer: Self-pay | Admitting: Family Medicine

## 2018-11-10 ENCOUNTER — Ambulatory Visit
Admission: RE | Admit: 2018-11-10 | Discharge: 2018-11-10 | Disposition: A | Discharge status: Home / Self Care | Payer: BLUE CROSS/BLUE SHIELD | Source: Ambulatory Visit | Attending: Family Medicine | Admitting: Family Medicine

## 2018-11-10 DIAGNOSIS — Z1231 Encounter for screening mammogram for malignant neoplasm of breast: Secondary | ICD-10-CM | POA: Diagnosis not present

## 2018-12-26 ENCOUNTER — Other Ambulatory Visit: Payer: Self-pay

## 2018-12-27 ENCOUNTER — Encounter: Payer: Self-pay | Admitting: Family Medicine

## 2018-12-27 ENCOUNTER — Other Ambulatory Visit: Payer: Self-pay

## 2018-12-27 ENCOUNTER — Ambulatory Visit (INDEPENDENT_AMBULATORY_CARE_PROVIDER_SITE_OTHER): Payer: BLUE CROSS/BLUE SHIELD | Admitting: Family Medicine

## 2018-12-27 VITALS — BP 135/89 | HR 73 | Temp 98.4°F | Ht 65.0 in | Wt 194.4 lb

## 2018-12-27 DIAGNOSIS — D509 Iron deficiency anemia, unspecified: Secondary | ICD-10-CM | POA: Diagnosis not present

## 2018-12-27 DIAGNOSIS — E1122 Type 2 diabetes mellitus with diabetic chronic kidney disease: Secondary | ICD-10-CM

## 2018-12-27 DIAGNOSIS — I129 Hypertensive chronic kidney disease with stage 1 through stage 4 chronic kidney disease, or unspecified chronic kidney disease: Secondary | ICD-10-CM

## 2018-12-27 DIAGNOSIS — E669 Obesity, unspecified: Secondary | ICD-10-CM

## 2018-12-27 DIAGNOSIS — Z0000 Encounter for general adult medical examination without abnormal findings: Secondary | ICD-10-CM

## 2018-12-27 DIAGNOSIS — N183 Chronic kidney disease, stage 3 unspecified: Secondary | ICD-10-CM

## 2018-12-27 DIAGNOSIS — F411 Generalized anxiety disorder: Secondary | ICD-10-CM | POA: Diagnosis not present

## 2018-12-27 DIAGNOSIS — K219 Gastro-esophageal reflux disease without esophagitis: Secondary | ICD-10-CM

## 2018-12-27 LAB — MICROALBUMIN, URINE WAIVED
Creatinine, Urine Waived: 100 mg/dL (ref 10–300)
Microalb, Ur Waived: 80 mg/L — ABNORMAL HIGH (ref 0–19)

## 2018-12-27 LAB — UA/M W/RFLX CULTURE, ROUTINE
Bilirubin, UA: NEGATIVE
Glucose, UA: NEGATIVE
Ketones, UA: NEGATIVE
Leukocytes,UA: NEGATIVE
Nitrite, UA: NEGATIVE
RBC, UA: NEGATIVE
Specific Gravity, UA: 1.02 (ref 1.005–1.030)
Urobilinogen, Ur: 1 mg/dL (ref 0.2–1.0)
pH, UA: 7 (ref 5.0–7.5)

## 2018-12-27 LAB — MICROSCOPIC EXAMINATION
Bacteria, UA: NONE SEEN
RBC, Urine: NONE SEEN /hpf (ref 0–2)
WBC, UA: NONE SEEN /hpf (ref 0–5)

## 2018-12-27 LAB — BAYER DCA HB A1C WAIVED: HB A1C (BAYER DCA - WAIVED): 6.7 % (ref <7.0)

## 2018-12-27 MED ORDER — PANTOPRAZOLE SODIUM 40 MG PO TBEC: 40.0000 mg | DELAYED_RELEASE_TABLET | Freq: Every day | ORAL | 3 refills | Status: DC

## 2018-12-27 NOTE — Assessment & Plan Note (Signed)
Under good control on current regimen. Continue current regimen. Continue to monitor. Call with any concerns. Refills given. Labs drawn today.   

## 2018-12-27 NOTE — Assessment & Plan Note (Signed)
Under good control with A1c of 6.7- continue current regimen. Continue to monitor. Call with any concerns. Refills given today.

## 2018-12-27 NOTE — Progress Notes (Signed)
BP 135/89   Pulse 73   Temp 98.4 F (36.9 C)   Ht 5' 5"  (1.651 m)   Wt 194 lb 6 oz (88.2 kg)   LMP 09/01/2014 (Approximate)   SpO2 98%   BMI 32.35 kg/m    Subjective:    Patient ID: Tabitha Evans, female    DOB: 1960-02-15, 58 y.o.   MRN: [id number]  HPI: Tabitha Evans is a 58 y.o. female presenting on 12/27/2018 for comprehensive medical examination. Current medical complaints include:  DIABETES Hypoglycemic episodes:no Polydipsia/polyuria: no Visual disturbance: no Chest pain: no Paresthesias: no Glucose Monitoring: no  Accucheck frequency: Not Checking Taking Insulin?: no Blood Pressure Monitoring: not checking Retinal Examination: next month Foot Exam: Up to Date Diabetic Education: Completed Pneumovax: Up to Date Influenza: Up to Date Aspirin: yes  HYPERTENSION / HYPERLIPIDEMIA Satisfied with current treatment? yes Duration of hypertension: chronic BP monitoring frequency: not checking BP medication side effects: no Past BP meds: triamterine HCTZ Duration of hyperlipidemia: chronic Cholesterol medication side effects: not on anything Cholesterol supplements: none Past cholesterol medications: none- declined Medication compliance: excellent compliance Aspirin: no Recent stressors: yes Recurrent headaches: no Visual changes: no Palpitations: no Dyspnea: no Chest pain: no Lower extremity edema: no Dizzy/lightheaded: no  ANEMIA Anemia status: controlled Etiology of anemia: Duration of anemia treatment:  Compliance with treatment: excellent compliance Iron supplementation side effects: not on anything Severity of anemia: mild Fatigue: no Decreased exercise tolerance: no  Dyspnea on exertion: no Palpitations: no Bleeding: no Pica: no  DEPRESSION Mood status: controlled Satisfied with current treatment?: yes Symptom severity: mild  Duration of current treatment : chronic Side effects: no Medication compliance: excellent compliance  Psychotherapy/counseling: no  Previous psychiatric medications: sertraline Depressed mood: no Anxious mood: no Anhedonia: no Significant weight loss or gain: no Insomnia: no  Fatigue: no Feelings of worthlessness or guilt: no Impaired concentration/indecisiveness: no Suicidal ideations: no Hopelessness: no Crying spells: no Depression screen [facility] 2/9 12/27/2018 06/23/2018 12/07/2017 09/02/2017 03/04/2017  Decreased Interest 0 0 1 1 0  Down, Depressed, Hopeless 0 0 0 0 0  PHQ - 2 Score 0 0 1 1 0  Altered sleeping - 0 3 3 3   Tired, decreased energy - 3 1 2 2   Change in appetite - 3 0 2 2  Feeling bad or failure about yourself  - 3 0 0 0  Trouble concentrating - 0 0 2 2  Moving slowly or fidgety/restless - 1 3 3 2   Suicidal thoughts - 0 0 0 0  PHQ-9 Score - 10 8 13 11   Difficult doing work/chores - Not difficult at all Somewhat difficult Somewhat difficult Somewhat difficult   Menopausal Symptoms: no  Depression Screen done today and results listed below:  Depression screen [facility] 2/9 12/27/2018 06/23/2018 12/07/2017 09/02/2017 03/04/2017  Decreased Interest 0 0 1 1 0  Down, Depressed, Hopeless 0 0 0 0 0  PHQ - 2 Score 0 0 1 1 0  Altered sleeping - 0 3 3 3   Tired, decreased energy - 3 1 2 2   Change in appetite - 3 0 2 2  Feeling bad or failure about yourself  - 3 0 0 0  Trouble concentrating - 0 0 2 2  Moving slowly or fidgety/restless - 1 3 3 2   Suicidal thoughts - 0 0 0 0  PHQ-9 Score - 10 8 13 11   Difficult doing work/chores - Not difficult at all Somewhat difficult Somewhat difficult Somewhat difficult    Past  Medical History:  Past Medical History:  Diagnosis Date  . Acid reflux   . Acute gastric ulcer with hemorrhage   . Acute posthemorrhagic anemia   . Anxiety disorder   . Benign hypertensive renal disease   . Chronic peptic ulcer of stomach   . Colon polyp   . Controlled type 2 diabetes mellitus with renal manifestation ([place])   . Controlled type 2 diabetes mellitus  with renal manifestation ([place])   . Frequent headaches   . Heart murmur   . Hypertension   . Iron deficiency anemia   . Lower GI bleed 02/25/2017  . Pre-diabetes   . Stomach irritation     Surgical History:  Past Surgical History:  Procedure Laterality Date  . BREAST BIOPSY Right 2014   benign  . COLONOSCOPY    . COLONOSCOPY WITH PROPOFOL N/A 10/01/2017   Procedure: COLONOSCOPY WITH PROPOFOL;  Surgeon: Virgel Manifold, MD;  Location: [facility] ENDOSCOPY;  Service: Endoscopy;  Laterality: N/A;  . ESOPHAGOGASTRODUODENOSCOPY N/A 02/26/2017   Procedure: ESOPHAGOGASTRODUODENOSCOPY (EGD);  Surgeon: Virgel Manifold, MD;  Location: [facility] ENDOSCOPY;  Service: Endoscopy;  Laterality: N/A;  . ESOPHAGOGASTRODUODENOSCOPY (EGD) WITH PROPOFOL N/A 10/01/2017   Procedure: ESOPHAGOGASTRODUODENOSCOPY (EGD) WITH PROPOFOL;  Surgeon: Virgel Manifold, MD;  Location: [facility] ENDOSCOPY;  Service: Endoscopy;  Laterality: N/A;  . UPPER GASTROINTESTINAL ENDOSCOPY      Medications:  Current Outpatient Medications on File Prior to Visit  Medication Sig  . fluticasone (FLONASE) 50 MCG/ACT nasal spray Place 2 sprays into both nostrils daily.  . metFORMIN (GLUCOPHAGE-XR) 500 MG 24 hr tablet Take 1 tablet by mouth once daily with breakfast  . sertraline (ZOLOFT) 100 MG tablet Take 1.5 tablets (150 mg total) by mouth daily.  Marland Kitchen triamterene-hydrochlorothiazide (MAXZIDE-25) 37.5-25 MG tablet Take 1 tablet by mouth daily.   No current facility-administered medications on file prior to visit.     Allergies:  No Known Allergies  Social History:  Social History   Socioeconomic History  . Marital status: Single    Spouse name: Not on file  . Number of children: Not on file  . Years of education: Not on file  . Highest education level: Not on file  Occupational History  . Not on file  Social Needs  . Financial resource strain: Not on file  . Food insecurity    Worry: Not on file    Inability: Not on file   . Transportation needs    Medical: Not on file    Non-medical: Not on file  Tobacco Use  . Smoking status: Never Smoker  . Smokeless tobacco: Never Used  Substance and Sexual Activity  . Alcohol use: Yes    Alcohol/week: 3.0 standard drinks    Types: 3 Cans of beer per week    Comment: on the weekends  . Drug use: No  . Sexual activity: Not Currently    Birth control/protection: Abstinence  Lifestyle  . Physical activity    Days per week: Not on file    Minutes per session: Not on file  . Stress: Not on file  Relationships  . Social Herbalist on phone: Not on file    Gets together: Not on file    Attends religious service: Not on file    Active member of club or organization: Not on file    Attends meetings of clubs or organizations: Not on file    Relationship status: Not on file  . Intimate partner violence  Fear of current or ex partner: Not on file    Emotionally abused: Not on file    Physically abused: Not on file    Forced sexual activity: Not on file  Other Topics Concern  . Not on file  Social History Narrative  . Not on file   Social History   Tobacco Use  Smoking Status Never Smoker  Smokeless Tobacco Never Used   Social History   Substance and Sexual Activity  Alcohol Use Yes  . Alcohol/week: 3.0 standard drinks  . Types: 3 Cans of beer per week   Comment: on the weekends    Family History:  Family History  Problem Relation Age of Onset  . Hypertension Mother   . Hyperlipidemia Mother   . Glaucoma Mother   . Depression Mother   . Liver disease Father   . Cancer Father   . Glaucoma Sister   . Hypertension Brother   . Dementia Maternal Grandmother   . Glaucoma Sister   . Vision loss Sister   . Hypertension Brother   . Kidney disease Brother   . Vision loss Brother     Past medical history, surgical history, medications, allergies, family history and social history reviewed with patient today and changes made to appropriate  areas of the chart.   Review of Systems  Constitutional: Negative.   HENT: Negative.   Eyes: Negative.   Respiratory: Negative.   Cardiovascular: Negative.   Gastrointestinal: Positive for abdominal pain and heartburn. Negative for blood in stool, constipation, diarrhea, melena, nausea and vomiting.  Genitourinary: Negative.   Musculoskeletal: Negative.   Skin: Negative.   Neurological: Negative.   Endo/Heme/Allergies: Negative.   Psychiatric/Behavioral: Negative.     All other ROS negative except what is listed above and in the HPI.      Objective:    BP 135/89   Pulse 73   Temp 98.4 F (36.9 C)   Ht 5' 5"  (1.651 m)   Wt 194 lb 6 oz (88.2 kg)   LMP 09/01/2014 (Approximate)   SpO2 98%   BMI 32.35 kg/m   Wt Readings from Last 3 Encounters:  12/27/18 194 lb 6 oz (88.2 kg)  08/11/18 191 lb (86.6 kg)  06/23/18 190 lb (86.2 kg)    Physical Exam Vitals signs and nursing note reviewed. Exam conducted with a chaperone present.  Constitutional:      General: She is not in acute distress.    Appearance: Normal appearance. She is not ill-appearing, toxic-appearing or diaphoretic.  HENT:     Head: Normocephalic and atraumatic.     Right Ear: Tympanic membrane, ear canal and external ear normal. There is no impacted cerumen.     Left Ear: Tympanic membrane, ear canal and external ear normal. There is no impacted cerumen.     Nose: Nose normal. No congestion or rhinorrhea.     Mouth/Throat:     Mouth: Mucous membranes are moist.     Pharynx: Oropharynx is clear. No oropharyngeal exudate or posterior oropharyngeal erythema.  Eyes:     General: No scleral icterus.       Right eye: No discharge.        Left eye: No discharge.     Extraocular Movements: Extraocular movements intact.     Conjunctiva/sclera: Conjunctivae normal.     Pupils: Pupils are equal, round, and reactive to light.  Neck:     Musculoskeletal: Normal range of motion and neck supple. No neck rigidity or  muscular tenderness.  Vascular: No carotid bruit.  Cardiovascular:     Rate and Rhythm: Normal rate and regular rhythm.     Pulses: Normal pulses.     Heart sounds: No murmur. No friction rub. No gallop.   Pulmonary:     Effort: Pulmonary effort is normal. No respiratory distress.     Breath sounds: Normal breath sounds. No stridor. No wheezing, rhonchi or rales.  Chest:     Chest wall: No tenderness.     Breasts:        Right: Normal. No swelling, bleeding, inverted nipple, mass, nipple discharge, skin change or tenderness.        Left: Normal. No swelling, bleeding, inverted nipple, mass, nipple discharge, skin change or tenderness.  Abdominal:     General: Abdomen is flat. Bowel sounds are normal. There is no distension.     Palpations: Abdomen is soft. There is no mass.     Tenderness: There is no abdominal tenderness. There is no right CVA tenderness, left CVA tenderness, guarding or rebound.     Hernia: No hernia is present.  Genitourinary:    Comments: Pelvic exam deferred with shared decision making Musculoskeletal:        General: No swelling, tenderness, deformity or signs of injury.     Right lower leg: No edema.     Left lower leg: No edema.  Lymphadenopathy:     Cervical: No cervical adenopathy.     Upper Body:     Right upper body: No supraclavicular, axillary or pectoral adenopathy.     Left upper body: No supraclavicular, axillary or pectoral adenopathy.  Skin:    General: Skin is warm and dry.     Capillary Refill: Capillary refill takes less than 2 seconds.     Coloration: Skin is not jaundiced or pale.     Findings: No bruising, erythema, lesion or rash.  Neurological:     General: No focal deficit present.     Mental Status: She is alert and oriented to person, place, and time. Mental status is at baseline.     Cranial Nerves: No cranial nerve deficit.     Sensory: No sensory deficit.     Motor: No weakness.     Coordination: Coordination normal.      Gait: Gait normal.     Deep Tendon Reflexes: Reflexes normal.  Psychiatric:        Mood and Affect: Mood normal.        Behavior: Behavior normal.        Thought Content: Thought content normal.        Judgment: Judgment normal.     Results for orders placed or performed in visit on 08/11/18  Novel Coronavirus, NAA ([facility])  Result Value Ref Range   SARS-CoV-2, NAA Not Detected Not Detected      Assessment & Plan:   Problem List Items Addressed This Visit      Endocrine   Controlled type 2 diabetes mellitus with renal manifestation ([place])    Under good control with A1c of 6.7- continue current regimen. Continue to monitor. Call with any concerns. Refills given today.      Relevant Orders   Bayer DCA Hb A1c Waived   Comp Met (CMET)   Lipid Panel w/o Chol/HDL Ratio OUT   Microalbumin, Urine Waived   TSH   UA/M w/rflx Culture, Routine     Genitourinary   Benign hypertensive renal disease    Under good control on current regimen. Continue  current regimen. Continue to monitor. Call with any concerns. Refills given. Labs drawn today.       Relevant Orders   Comp Met (CMET)   Microalbumin, Urine Waived   TSH   UA/M w/rflx Culture, Routine     Other   Iron deficiency anemia    Off medicine and feeling well. Rechecking levels today. Await results and treat as needed.       Relevant Orders   CBC with Differential OUT   Comp Met (CMET)   TSH   UA/M w/rflx Culture, Routine   Iron Binding Cap (TIBC)   Ferritin   Anxiety disorder    Under good control on current regimen. Continue current regimen. Continue to monitor. Call with any concerns. Refills given. Labs drawn today.       Relevant Orders   Comp Met (CMET)   TSH   UA/M w/rflx Culture, Routine   Obesity (BMI 30.0-34.9)    Encouraged diet and exercise with goal of losing 1-2 lbs per week.      Relevant Orders   Comp Met (CMET)   TSH   UA/M w/rflx Culture, Routine    Other Visit Diagnoses    Routine  general medical examination at a health care facility    -  Primary   Vaccines up to date. Screening labs checked today. Pap, mammogram and colonoscopy up to date. Continue diet and exercise. Call with any concerns.    Gastroesophageal reflux disease, unspecified whether esophagitis present       Not under good control. Will change from omeprazole to protonix and recheck 3 months. Call with any concerns.    Relevant Medications   pantoprazole (PROTONIX) 40 MG tablet       Follow up plan: Return in about 3 months (around 03/29/2019) for DM follow up.   LABORATORY TESTING:  - Pap smear: up to date  IMMUNIZATIONS:   - Tdap: Tetanus vaccination status reviewed: last tetanus booster within 10 years. - Influenza: Up to date - Pneumovax: Up to date  SCREENING: -Mammogram: Up to date  - Colonoscopy: Up to date   PATIENT COUNSELING:   Advised to take 1 mg of folate supplement per day if capable of pregnancy.   Sexuality: Discussed sexually transmitted diseases, partner selection, use of condoms, avoidance of unintended pregnancy  and contraceptive alternatives.   Advised to avoid cigarette smoking.  I discussed with the patient that most people either abstain from alcohol or drink within safe limits (<=14/week and <=4 drinks/occasion for males, <=7/weeks and <= 3 drinks/occasion for females) and that the risk for alcohol disorders and other health effects rises proportionally with the number of drinks per week and how often a drinker exceeds daily limits.  Discussed cessation/primary prevention of drug use and availability of treatment for abuse.   Diet: Encouraged to adjust caloric intake to maintain  or achieve ideal body weight, to reduce intake of dietary saturated fat and total fat, to limit sodium intake by avoiding high sodium foods and not adding table salt, and to maintain adequate dietary potassium and calcium preferably from fresh fruits, vegetables, and low-fat dairy products.     stressed the importance of regular exercise  Injury prevention: Discussed safety belts, safety helmets, smoke detector, smoking near bedding or upholstery.   Dental health: Discussed importance of regular tooth brushing, flossing, and dental visits.    NEXT PREVENTATIVE PHYSICAL DUE IN 1 YEAR. Return in about 3 months (around 03/29/2019) for DM follow up.

## 2018-12-27 NOTE — Assessment & Plan Note (Signed)
Off medicine and feeling well. Rechecking levels today. Await results and treat as needed.

## 2018-12-27 NOTE — Assessment & Plan Note (Signed)
Encouraged diet and exercise with goal of losing 1-2lbs per week.  

## 2018-12-28 LAB — COMPREHENSIVE METABOLIC PANEL
ALT: 25 IU/L (ref 0–32)
AST: 35 IU/L (ref 0–40)
Albumin/Globulin Ratio: 1.3 (ref 1.2–2.2)
Albumin: 4.4 g/dL (ref 3.8–4.9)
Alkaline Phosphatase: 105 IU/L (ref 39–117)
BUN/Creatinine Ratio: 15 (ref 9–23)
BUN: 10 mg/dL (ref 6–24)
Bilirubin Total: 0.3 mg/dL (ref 0.0–1.2)
CO2: 23 mmol/L (ref 20–29)
Calcium: 9.8 mg/dL (ref 8.7–10.2)
Chloride: 100 mmol/L (ref 96–106)
Creatinine, Ser: 0.68 mg/dL (ref 0.57–1.00)
GFR calc Af Amer: 113 mL/min/{1.73_m2} (ref >59)
GFR calc non Af Amer: 98 mL/min/{1.73_m2} (ref >59)
Globulin, Total: 3.3 g/dL (ref 1.5–4.5)
Glucose: 116 mg/dL — ABNORMAL HIGH (ref 65–99)
Potassium: 4.1 mmol/L (ref 3.5–5.2)
Sodium: 138 mmol/L (ref 134–144)
Total Protein: 7.7 g/dL (ref 6.0–8.5)

## 2018-12-28 LAB — CBC WITH DIFFERENTIAL/PLATELET
Basophils Absolute: 0 10*3/uL (ref 0.0–0.2)
Basos: 0 %
EOS (ABSOLUTE): 0.3 10*3/uL (ref 0.0–0.4)
Eos: 4 %
Hematocrit: 41.4 % (ref 34.0–46.6)
Hemoglobin: 13.9 g/dL (ref 11.1–15.9)
Immature Grans (Abs): 0 10*3/uL (ref 0.0–0.1)
Immature Granulocytes: 0 %
Lymphocytes Absolute: 1.8 10*3/uL (ref 0.7–3.1)
Lymphs: 26 %
MCH: 27.6 pg (ref 26.6–33.0)
MCHC: 33.6 g/dL (ref 31.5–35.7)
MCV: 82 fL (ref 79–97)
Monocytes Absolute: 0.6 10*3/uL (ref 0.1–0.9)
Monocytes: 8 %
Neutrophils Absolute: 4.5 10*3/uL (ref 1.4–7.0)
Neutrophils: 62 %
Platelets: 317 10*3/uL (ref 150–450)
RBC: 5.04 x10E6/uL (ref 3.77–5.28)
RDW: 14.1 % (ref 11.7–15.4)
WBC: 7.1 10*3/uL (ref 3.4–10.8)

## 2018-12-28 LAB — IRON AND TIBC
Iron Saturation: 21 % (ref 15–55)
Iron: 69 ug/dL (ref 27–159)
Total Iron Binding Capacity: 324 ug/dL (ref 250–450)
UIBC: 255 ug/dL (ref 131–425)

## 2018-12-28 LAB — LIPID PANEL W/O CHOL/HDL RATIO
Cholesterol, Total: 236 mg/dL — ABNORMAL HIGH (ref 100–199)
HDL: 50 mg/dL (ref >39)
LDL Chol Calc (NIH): 167 mg/dL — ABNORMAL HIGH (ref 0–99)
Triglycerides: 108 mg/dL (ref 0–149)
VLDL Cholesterol Cal: 19 mg/dL (ref 5–40)

## 2018-12-28 LAB — TSH: TSH: 3.75 u[IU]/mL (ref 0.450–4.500)

## 2018-12-28 LAB — FERRITIN: Ferritin: 153 ng/mL — ABNORMAL HIGH (ref 15–150)

## 2019-02-07 ENCOUNTER — Other Ambulatory Visit: Payer: Self-pay | Admitting: Family Medicine

## 2019-02-07 LAB — HM DIABETES EYE EXAM

## 2019-03-08 ENCOUNTER — Encounter: Payer: Self-pay | Admitting: Family Medicine

## 2019-03-09 ENCOUNTER — Encounter: Payer: Self-pay | Admitting: Family Medicine

## 2019-03-09 ENCOUNTER — Other Ambulatory Visit: Payer: Self-pay | Admitting: Family Medicine

## 2019-03-10 NOTE — Telephone Encounter (Signed)
Routing to provider  

## 2019-03-11 MED ORDER — SERTRALINE HCL 100 MG PO TABS: 150.0000 mg | ORAL_TABLET | Freq: Every day | ORAL | 0 refills | Status: DC

## 2019-03-11 MED ORDER — METFORMIN HCL ER 500 MG PO TB24: 500.0000 mg | ORAL_TABLET | Freq: Every day | ORAL | 0 refills | Status: DC

## 2019-03-13 MED ORDER — TRIAMTERENE-HCTZ 37.5-25 MG PO TABS: 1.0000 | ORAL_TABLET | Freq: Every day | ORAL | 1 refills | Status: DC

## 2019-03-13 MED ORDER — PANTOPRAZOLE SODIUM 40 MG PO TBEC: 40.0000 mg | DELAYED_RELEASE_TABLET | Freq: Every day | ORAL | 0 refills | Status: DC

## 2019-03-30 ENCOUNTER — Ambulatory Visit: Payer: BLUE CROSS/BLUE SHIELD | Admitting: Family Medicine

## 2019-03-30 ENCOUNTER — Other Ambulatory Visit: Payer: Self-pay | Admitting: Family Medicine

## 2019-05-07 ENCOUNTER — Ambulatory Visit: Payer: BLUE CROSS/BLUE SHIELD

## 2019-12-14 ENCOUNTER — Other Ambulatory Visit: Payer: Self-pay | Admitting: Family

## 2019-12-14 ENCOUNTER — Other Ambulatory Visit: Payer: Self-pay | Admitting: Family Medicine

## 2019-12-14 DIAGNOSIS — Z1231 Encounter for screening mammogram for malignant neoplasm of breast: Secondary | ICD-10-CM

## 2020-01-25 ENCOUNTER — Other Ambulatory Visit: Payer: Self-pay

## 2020-01-25 ENCOUNTER — Ambulatory Visit
Admission: RE | Admit: 2020-01-25 | Discharge: 2020-01-25 | Disposition: A | Discharge status: Home / Self Care | Payer: BLUE CROSS/BLUE SHIELD | Source: Ambulatory Visit | Attending: Family | Admitting: Family

## 2020-01-25 DIAGNOSIS — Z1231 Encounter for screening mammogram for malignant neoplasm of breast: Secondary | ICD-10-CM | POA: Diagnosis not present

## 2020-12-12 ENCOUNTER — Other Ambulatory Visit: Payer: Self-pay | Admitting: Family

## 2020-12-12 DIAGNOSIS — Z1231 Encounter for screening mammogram for malignant neoplasm of breast: Secondary | ICD-10-CM

## 2021-01-30 ENCOUNTER — Ambulatory Visit
Admission: RE | Admit: 2021-01-30 | Discharge: 2021-01-30 | Disposition: A | Discharge status: Home / Self Care | Payer: BLUE CROSS/BLUE SHIELD | Source: Ambulatory Visit | Attending: Family | Admitting: Family

## 2021-01-30 ENCOUNTER — Other Ambulatory Visit: Payer: Self-pay

## 2021-01-30 DIAGNOSIS — Z1231 Encounter for screening mammogram for malignant neoplasm of breast: Secondary | ICD-10-CM | POA: Diagnosis present

## 2021-06-13 ENCOUNTER — Telehealth: Payer: Self-pay

## 2021-06-13 NOTE — Telephone Encounter (Signed)
Left vm to confirm 06/19/21 appointment-[name] ?

## 2021-06-19 ENCOUNTER — Encounter: Payer: Self-pay | Admitting: Nurse Practitioner

## 2021-06-19 ENCOUNTER — Ambulatory Visit (INDEPENDENT_AMBULATORY_CARE_PROVIDER_SITE_OTHER): Payer: BLUE CROSS/BLUE SHIELD | Admitting: Nurse Practitioner

## 2021-06-19 VITALS — BP 136/83 | HR 85 | Temp 98.7°F | Resp 16 | Ht 65.0 in | Wt 190.8 lb

## 2021-06-19 DIAGNOSIS — I129 Hypertensive chronic kidney disease with stage 1 through stage 4 chronic kidney disease, or unspecified chronic kidney disease: Secondary | ICD-10-CM | POA: Diagnosis not present

## 2021-06-19 DIAGNOSIS — D509 Iron deficiency anemia, unspecified: Secondary | ICD-10-CM

## 2021-06-19 DIAGNOSIS — N183 Chronic kidney disease, stage 3 unspecified: Secondary | ICD-10-CM

## 2021-06-19 DIAGNOSIS — E782 Mixed hyperlipidemia: Secondary | ICD-10-CM

## 2021-06-19 DIAGNOSIS — E1165 Type 2 diabetes mellitus with hyperglycemia: Secondary | ICD-10-CM

## 2021-06-19 DIAGNOSIS — E559 Vitamin D deficiency, unspecified: Secondary | ICD-10-CM

## 2021-06-19 DIAGNOSIS — Z23 Encounter for immunization: Secondary | ICD-10-CM

## 2021-06-19 DIAGNOSIS — E538 Deficiency of other specified B group vitamins: Secondary | ICD-10-CM

## 2021-06-19 DIAGNOSIS — Z7689 Persons encountering health services in other specified circumstances: Secondary | ICD-10-CM

## 2021-06-19 DIAGNOSIS — L2389 Allergic contact dermatitis due to other agents: Secondary | ICD-10-CM

## 2021-06-19 DIAGNOSIS — K279 Peptic ulcer, site unspecified, unspecified as acute or chronic, without hemorrhage or perforation: Secondary | ICD-10-CM

## 2021-06-19 DIAGNOSIS — E119 Type 2 diabetes mellitus without complications: Secondary | ICD-10-CM

## 2021-06-19 LAB — POCT GLYCOSYLATED HEMOGLOBIN (HGB A1C): Hemoglobin A1C: 7 % — AB (ref 4.0–5.6)

## 2021-06-19 MED ORDER — FLUOXETINE HCL 20 MG PO CAPS: 20.0000 mg | ORAL_CAPSULE | Freq: Every day | ORAL | 3 refills | Status: DC

## 2021-06-19 MED ORDER — ZOSTER VAC RECOMB ADJUVANTED 50 MCG/0.5ML IM SUSR: 0.5000 mL | Freq: Once | INTRAMUSCULAR | 0 refills | Status: AC

## 2021-06-19 MED ORDER — TRIAMCINOLONE ACETONIDE 0.1 % EX CREA: 1.0000 "application " | TOPICAL_CREAM | Freq: Two times a day (BID) | CUTANEOUS | 1 refills | Status: DC

## 2021-06-19 MED ORDER — ATORVASTATIN CALCIUM 10 MG PO TABS: 10.0000 mg | ORAL_TABLET | Freq: Every day | ORAL | 3 refills | Status: DC

## 2021-06-19 MED ORDER — METFORMIN HCL ER 500 MG PO TB24: 500.0000 mg | ORAL_TABLET | Freq: Every day | ORAL | 3 refills | Status: DC

## 2021-06-19 NOTE — Progress Notes (Signed)
[facility] [place], [place] [address]  Internal MEDICINE  Office Visit Note  Patient Name: Tabitha Evans  [id number]  [id number]  Date of Service: 06/19/2021   Complaints/HPI Pt is here for establishment of PCP. Chief Complaint  Patient presents with   New Patient (Initial Visit)   Diabetes   Hypertension   Rash    On legs for 2 years    Cough    Lingering for about 3 months    Anxiety   Anemia   Gastroesophageal Reflux   HPI Tabitha Evans presents for a new patient visit to establish care.  She is a well-appearing 61 year old female with type 2 diabetes, hypertension, gastroesophageal reflux, anxiety disorder, diverticulosis, renal disease, peptic ulcer and iron deficiency anemia.  She had a change in insurance and was in need of a new primary care provider and so she is establishing care today.  Her only significant surgical history is endoscopy procedures and a breast biopsy on the right side in 2014 that resulted as benign.  She currently works full-time.  She is a non-smoker, she drinks approximately 3 beers per week typically on the weekends and denies any recreational drug use.  Her main concern at this time is getting her diabetes controlled.  She was previously on Trulicity but her current insurance will not cover it and she states that they told her she would have to pay the entire $2000 for a month supply.  She is interested in trying Ozempic. Her blood pressure is stable and well-controlled with current medication.  Her other vital signs are stable and within normal limits as well.  Her BMI is elevated at 31.75 and her weight is 190 pounds.    Current Medication: Outpatient Encounter Medications as of 06/19/2021  Medication Sig   omeprazole (PRILOSEC) 20 MG capsule Take 20 mg by mouth daily.   triamcinolone cream (KENALOG) 0.1 % Apply 1 application. topically 2 (two) times daily. To affected area until resolved.   [DISCONTINUED] atorvastatin  (LIPITOR) 10 MG tablet Take by mouth.   [DISCONTINUED] FLUoxetine (PROZAC) 20 MG capsule Take 1 capsule by mouth daily.   [DISCONTINUED] metFORMIN (GLUCOPHAGE-XR) 500 MG 24 hr tablet Take 1 tablet (500 mg total) by mouth daily with breakfast.   [DISCONTINUED] pantoprazole (PROTONIX) 40 MG tablet Take 1 tablet (40 mg total) by mouth daily.   [DISCONTINUED] sertraline (ZOLOFT) 100 MG tablet Take 1.5 tablets (150 mg total) by mouth daily.   [DISCONTINUED] Zoster Vaccine Adjuvanted [facility]) injection Inject 0.5 mLs into the muscle once.   atorvastatin (LIPITOR) 10 MG tablet Take 1 tablet (10 mg total) by mouth daily.   FLUoxetine (PROZAC) 20 MG capsule Take 1 capsule (20 mg total) by mouth daily.   lisinopril-hydrochlorothiazide (ZESTORETIC) 20-25 MG tablet Take 1 tablet by mouth every morning.   metFORMIN (GLUCOPHAGE-XR) 500 MG 24 hr tablet Take 1 tablet (500 mg total) by mouth daily with breakfast.   [EXPIRED] Zoster Vaccine Adjuvanted [facility]) injection Inject 0.5 mLs into the muscle once for 1 dose.   [DISCONTINUED] fluticasone (FLONASE) 50 MCG/ACT nasal spray Place 2 sprays into both nostrils daily.   [DISCONTINUED] liraglutide (VICTOZA) 18 MG/3ML SOPN Inject into the skin. (Patient not taking: Reported on 06/19/2021)   [DISCONTINUED] triamterene-hydrochlorothiazide (MAXZIDE-25) 37.5-25 MG tablet Take 1 tablet by mouth daily. (Patient not taking: Reported on 06/19/2021)   No facility-administered encounter medications on file as of 06/19/2021.    Surgical History: Past Surgical History:  Procedure Laterality Date  BREAST BIOPSY Right 2014   benign   COLONOSCOPY     COLONOSCOPY WITH PROPOFOL N/A 10/01/2017   Procedure: COLONOSCOPY WITH PROPOFOL;  Surgeon: Virgel Manifold, MD;  Location: [facility] ENDOSCOPY;  Service: Endoscopy;  Laterality: N/A;   ESOPHAGOGASTRODUODENOSCOPY N/A 02/26/2017   Procedure: ESOPHAGOGASTRODUODENOSCOPY (EGD);  Surgeon: Virgel Manifold, MD;  Location: [facility]  ENDOSCOPY;  Service: Endoscopy;  Laterality: N/A;   ESOPHAGOGASTRODUODENOSCOPY (EGD) WITH PROPOFOL N/A 10/01/2017   Procedure: ESOPHAGOGASTRODUODENOSCOPY (EGD) WITH PROPOFOL;  Surgeon: Virgel Manifold, MD;  Location: [facility] ENDOSCOPY;  Service: Endoscopy;  Laterality: N/A;   UPPER GASTROINTESTINAL ENDOSCOPY      Medical History: Past Medical History:  Diagnosis Date   Acid reflux    Acute gastric ulcer with hemorrhage    Acute posthemorrhagic anemia    Anxiety disorder    Benign hypertensive renal disease    Chronic peptic ulcer of stomach    Colon polyp    Controlled type 2 diabetes mellitus with renal manifestation (HCC)    Controlled type 2 diabetes mellitus with renal manifestation (HCC)    Frequent headaches    Heart murmur    Hypertension    Iron deficiency anemia    Lower GI bleed 02/25/2017   Pre-diabetes    Stomach irritation     Family History: Family History  Problem Relation Age of Onset   Hypertension Mother    Hyperlipidemia Mother    Glaucoma Mother    Depression Mother    Liver disease Father    Cancer Father    Glaucoma Sister    Glaucoma Sister    Vision loss Sister    Dementia Maternal Grandmother    Hypertension Brother    Hypertension Brother    Kidney disease Brother    Vision loss Brother    Breast cancer Neg Hx     Social History   Socioeconomic History   Marital status: Single    Spouse name: Not on file   Number of children: Not on file   Years of education: Not on file   Highest education level: Not on file  Occupational History   Not on file  Tobacco Use   Smoking status: Never   Smokeless tobacco: Never  Vaping Use   Vaping Use: Never used  Substance and Sexual Activity   Alcohol use: Yes    Alcohol/week: 3.0 standard drinks    Types: 3 Cans of beer per week    Comment: on the weekends   Drug use: No   Sexual activity: Not Currently    Birth control/protection: Abstinence  Other Topics Concern   Not on file  Social  History Narrative   Not on file   Social Determinants of Health   Financial Resource Strain: Not on file  Food Insecurity: Not on file  Transportation Needs: Not on file  Physical Activity: Not on file  Stress: Not on file  Social Connections: Not on file  Intimate Partner Violence: Not on file     Review of Systems  Constitutional:  Positive for fatigue. Negative for chills and unexpected weight change.  HENT:  Negative for congestion, rhinorrhea, sneezing and sore throat.   Eyes:  Negative for redness.  Respiratory:  Negative for cough, chest tightness and shortness of breath.   Cardiovascular:  Negative for chest pain and palpitations.  Gastrointestinal:  Negative for abdominal pain, constipation, diarrhea, nausea and vomiting.  Genitourinary:  Negative for dysuria and frequency.  Musculoskeletal:  Negative for arthralgias, back pain, joint  swelling and neck pain.  Skin:  Negative for rash.  Neurological: Negative.  Negative for tremors and numbness.  Hematological:  Negative for adenopathy. Does not bruise/bleed easily.  Psychiatric/Behavioral:  Negative for behavioral problems (Depression), sleep disturbance and suicidal ideas. The patient is not nervous/anxious.    Vital Signs: BP 136/83   Pulse 85   Temp 98.7 F (37.1 C)   Resp 16   Ht _0  (1.651 m)   Wt 190 lb 12.8 oz (86.5 kg)   LMP 09/01/2014 (Approximate)   SpO2 98%   BMI 31.75 kg/m    Physical Exam Vitals reviewed.  Constitutional:      General: She is not in acute distress.    Appearance: Normal appearance. She is obese. She is not ill-appearing.  HENT:     Head: Normocephalic and atraumatic.  Eyes:     Pupils: Pupils are equal, round, and reactive to light.  Cardiovascular:     Rate and Rhythm: Normal rate and regular rhythm.  Pulmonary:     Effort: Pulmonary effort is normal. No respiratory distress.  Neurological:     Mental Status: She is alert and oriented to person, place, and time.   Psychiatric:        Mood and Affect: Mood normal.        Behavior: Behavior normal.      Assessment/Plan: 1. Type 2 diabetes mellitus with hyperglycemia, without long-term current use of insulin (HCC) The last A1c on record was 6.1 which was 4 years ago.  Her A1c today is 7.0 which is elevated compared to previous.  Metformin refills ordered.  Patient also switched from Victoza to Ozempic.  - POCT HgB A1C - metFORMIN (GLUCOPHAGE-XR) 500 MG 24 hr tablet; Take 1 tablet (500 mg total) by mouth daily with breakfast.  Dispense: 90 tablet; Refill: 3  2. Iron deficiency anemia, unspecified iron deficiency anemia type Routine labs ordered - CBC with Differential/Platelet - Iron, TIBC and Ferritin Panel - B12 and Folate Panel  3. Benign hypertensive renal disease Continue current dose of lisinopril-hydrochlorothiazide, labs ordered - lisinopril-hydrochlorothiazide (ZESTORETIC) 20-25 MG tablet; Take 1 tablet by mouth every morning. - CBC with Differential/Platelet - CMP14+EGFR - Lipid Profile - TSH + free T4 - Iron, TIBC and Ferritin Panel - B12 and Folate Panel  4. Mixed hyperlipidemia Continue current dose of atorvastatin, refills ordered.  Routine labs ordered. - atorvastatin (LIPITOR) 10 MG tablet; Take 1 tablet (10 mg total) by mouth daily.  Dispense: 90 tablet; Refill: 3 - CMP14+EGFR - Lipid Profile - TSH + free T4  5. Vitamin D deficiency Routine lab ordered - Vitamin D (25 hydroxy)  6. B12 deficiency Routine labs ordered - CBC with Differential/Platelet - Iron, TIBC and Ferritin Panel - B12 and Folate Panel  7. Allergic contact dermatitis due to other agents Triamcinolone cream refills ordered - triamcinolone cream (KENALOG) 0.1 %; Apply 1 application. topically 2 (two) times daily. To affected area until resolved.  Dispense: 45 g; Refill: 1  8. Need for vaccination - Zoster Vaccine Adjuvanted [facility]) injection; Inject 0.5 mLs into the muscle once for 1 dose.   Dispense: 0.5 mL; Refill: 0  9. Peptic ulcer disease Continue omeprazole  - omeprazole (PRILOSEC) 20 MG capsule; Take 20 mg by mouth daily.  10. Encounter to establish care with new doctor Patient has not had a set primary care provider consistently, has chronic medical conditions and needs consistent follow-up and medical care.  Medication refills ordered, routine labs ordered. - omeprazole (  PRILOSEC) 20 MG capsule; Take 20 mg by mouth daily. - Zoster Vaccine Adjuvanted [facility]) injection; Inject 0.5 mLs into the muscle once for 1 dose.  Dispense: 0.5 mL; Refill: 0 - FLUoxetine (PROZAC) 20 MG capsule; Take 1 capsule (20 mg total) by mouth daily.  Dispense: 90 capsule; Refill: 3 - CBC with Differential/Platelet - CMP14+EGFR - Lipid Profile - TSH + free T4 - Vitamin D (25 hydroxy) - Iron, TIBC and Ferritin Panel - B12 and Folate Panel     General Counseling: Apollonia verbalizes understanding of the findings of todays visit and agrees with plan of treatment. I have discussed any further diagnostic evaluation that may be needed or ordered today. We also reviewed her medications today. she has been encouraged to call the office with any questions or concerns that should arise related to todays visit.    Counseling:  [place] Controlled Substance Database was reviewed by me.  Orders Placed This Encounter  Procedures   CBC with Differential/Platelet   CMP14+EGFR   Lipid Profile   TSH + free T4   Vitamin D (25 hydroxy)   Iron, TIBC and Ferritin Panel   B12 and Folate Panel   POCT HgB A1C    Meds ordered this encounter  Medications   Zoster Vaccine Adjuvanted [facility]) injection    Sig: Inject 0.5 mLs into the muscle once for 1 dose.    Dispense:  0.5 mL    Refill:  0   metFORMIN (GLUCOPHAGE-XR) 500 MG 24 hr tablet    Sig: Take 1 tablet (500 mg total) by mouth daily with breakfast.    Dispense:  90 tablet    Refill:  3    For future refills, please do 90 day supply.    FLUoxetine (PROZAC) 20 MG capsule    Sig: Take 1 capsule (20 mg total) by mouth daily.    Dispense:  90 capsule    Refill:  3    For future refills, please do 90 day supply   atorvastatin (LIPITOR) 10 MG tablet    Sig: Take 1 tablet (10 mg total) by mouth daily.    Dispense:  90 tablet    Refill:  3    For future refills, please do 90 day supply   triamcinolone cream (KENALOG) 0.1 %    Sig: Apply 1 application. topically 2 (two) times daily. To affected area until resolved.    Dispense:  45 g    Refill:  1    Return for CPE.  At earliest available opening  Time spent:30 Minutes Time spent with patient included reviewing progress notes, labs, imaging studies, and discussing plan for follow up.   [place] Controlled Substance Database was reviewed by me for overdose risk score (ORS)   This patient was seen by Jonetta Osgood, FNP-C in collaboration with Dr. Clayborn Bigness as a part of collaborative care agreement.    Tais Koestner R. Valetta Fuller, MSN, FNP-C Internal Medicine

## 2021-06-20 ENCOUNTER — Telehealth: Payer: Self-pay

## 2021-06-20 LAB — CMP14+EGFR
ALT: 20 IU/L (ref 0–32)
AST: 21 IU/L (ref 0–40)
Albumin/Globulin Ratio: 1.7 (ref 1.2–2.2)
Albumin: 5 g/dL — ABNORMAL HIGH (ref 3.8–4.9)
Alkaline Phosphatase: 123 IU/L — ABNORMAL HIGH (ref 44–121)
BUN/Creatinine Ratio: 14 (ref 9–23)
BUN: 10 mg/dL (ref 6–24)
Bilirubin Total: 0.4 mg/dL (ref 0.0–1.2)
CO2: 25 mmol/L (ref 20–29)
Calcium: 10.4 mg/dL — ABNORMAL HIGH (ref 8.7–10.2)
Chloride: 99 mmol/L (ref 96–106)
Creatinine, Ser: 0.74 mg/dL (ref 0.57–1.00)
Globulin, Total: 2.9 g/dL (ref 1.5–4.5)
Glucose: 112 mg/dL — ABNORMAL HIGH (ref 70–99)
Potassium: 4.6 mmol/L (ref 3.5–5.2)
Sodium: 140 mmol/L (ref 134–144)
Total Protein: 7.9 g/dL (ref 6.0–8.5)
eGFR: 94 mL/min/{1.73_m2} (ref >59)

## 2021-06-20 LAB — CBC WITH DIFFERENTIAL/PLATELET
Basophils Absolute: 0 10*3/uL (ref 0.0–0.2)
Basos: 1 %
EOS (ABSOLUTE): 0.2 10*3/uL (ref 0.0–0.4)
Eos: 2 %
Hematocrit: 40.4 % (ref 34.0–46.6)
Hemoglobin: 14 g/dL (ref 11.1–15.9)
Immature Grans (Abs): 0 10*3/uL (ref 0.0–0.1)
Immature Granulocytes: 0 %
Lymphocytes Absolute: 2.6 10*3/uL (ref 0.7–3.1)
Lymphs: 31 %
MCH: 29.1 pg (ref 26.6–33.0)
MCHC: 34.7 g/dL (ref 31.5–35.7)
MCV: 84 fL (ref 79–97)
Monocytes Absolute: 0.7 10*3/uL (ref 0.1–0.9)
Monocytes: 8 %
Neutrophils Absolute: 4.8 10*3/uL (ref 1.4–7.0)
Neutrophils: 58 %
Platelets: 314 10*3/uL (ref 150–450)
RBC: 4.81 x10E6/uL (ref 3.77–5.28)
RDW: 13 % (ref 11.7–15.4)
WBC: 8.4 10*3/uL (ref 3.4–10.8)

## 2021-06-20 LAB — B12 AND FOLATE PANEL
Folate: 10.9 ng/mL (ref >3.0)
Vitamin B-12: 701 pg/mL (ref 232–1245)

## 2021-06-20 LAB — IRON,TIBC AND FERRITIN PANEL
Ferritin: 151 ng/mL — ABNORMAL HIGH (ref 15–150)
Iron Saturation: 30 % (ref 15–55)
Iron: 97 ug/dL (ref 27–159)
Total Iron Binding Capacity: 321 ug/dL (ref 250–450)
UIBC: 224 ug/dL (ref 131–425)

## 2021-06-20 LAB — LIPID PANEL
Chol/HDL Ratio: 3.5 ratio (ref 0.0–4.4)
Cholesterol, Total: 156 mg/dL (ref 100–199)
HDL: 44 mg/dL (ref >39)
LDL Chol Calc (NIH): 93 mg/dL (ref 0–99)
Triglycerides: 104 mg/dL (ref 0–149)
VLDL Cholesterol Cal: 19 mg/dL (ref 5–40)

## 2021-06-20 LAB — TSH+FREE T4
Free T4: 1.14 ng/dL (ref 0.82–1.77)
TSH: 2.97 u[IU]/mL (ref 0.450–4.500)

## 2021-06-20 LAB — VITAMIN D 25 HYDROXY (VIT D DEFICIENCY, FRACTURES): Vit D, 25-Hydroxy: 19.1 ng/mL — ABNORMAL LOW (ref 30.0–100.0)

## 2021-06-20 NOTE — Progress Notes (Signed)
Hey Alex  ?Can you call [facility] and see if they can add phosphorus level ionized calcium and intact PTH?

## 2021-06-20 NOTE — Telephone Encounter (Signed)
-----   Message from Jonetta Osgood, NP sent at 06/20/2021  3:27 PM EDT ----- ?Hey Alex  ?Can you call [facility] and see if they can add phosphorus level ionized calcium and intact PTH? ?

## 2021-06-24 LAB — SPECIMEN STATUS REPORT

## 2021-06-24 LAB — PHOSPHORUS: Phosphorus: 4.2 mg/dL (ref 3.0–4.3)

## 2021-07-03 ENCOUNTER — Encounter: Payer: Self-pay | Admitting: Nurse Practitioner

## 2021-07-03 ENCOUNTER — Ambulatory Visit (INDEPENDENT_AMBULATORY_CARE_PROVIDER_SITE_OTHER): Payer: BLUE CROSS/BLUE SHIELD | Admitting: Nurse Practitioner

## 2021-07-03 VITALS — BP 127/73 | HR 87 | Temp 98.4°F | Resp 16 | Ht 65.0 in | Wt 192.2 lb

## 2021-07-03 DIAGNOSIS — R300 Dysuria: Secondary | ICD-10-CM | POA: Diagnosis not present

## 2021-07-03 DIAGNOSIS — E559 Vitamin D deficiency, unspecified: Secondary | ICD-10-CM | POA: Diagnosis not present

## 2021-07-03 DIAGNOSIS — Z0001 Encounter for general adult medical examination with abnormal findings: Secondary | ICD-10-CM

## 2021-07-03 DIAGNOSIS — Z23 Encounter for immunization: Secondary | ICD-10-CM | POA: Diagnosis not present

## 2021-07-03 DIAGNOSIS — E1165 Type 2 diabetes mellitus with hyperglycemia: Secondary | ICD-10-CM | POA: Diagnosis not present

## 2021-07-03 MED ORDER — SEMAGLUTIDE(0.25 OR 0.5MG/DOS) 2 MG/3ML ~~LOC~~ SOPN: 0.5000 mg | PEN_INJECTOR | SUBCUTANEOUS | 1 refills | Status: DC

## 2021-07-03 MED ORDER — VITAMIN D (ERGOCALCIFEROL) 1.25 MG (50000 UNIT) PO CAPS: 50000.0000 [IU] | ORAL_CAPSULE | ORAL | 1 refills | Status: DC

## 2021-07-03 NOTE — Progress Notes (Signed)
[facility] [place], [place] [address]  Internal MEDICINE  Office Visit Note  Patient Name: Tabitha Evans  [id number]  [id number]  Date of Service: 07/03/2021  Chief Complaint  Patient presents with   Annual Exam   Diabetes   Gastroesophageal Reflux   Hypertension   Anxiety   Anemia   Cough    For 3 months, dryness at the back of throat, mostly happens at night    HPI Tabitha Evans presents for an annual well visit and physical exam. She is a well-appearing 61 year old female with type 2 diabetes, hypertension, gastroesophageal reflux, anxiety disorder, diverticulosis, renal disease, peptic ulcer and iron deficiency anemia.   She currently works full-time.  She is a non-smoker, she drinks approximately 3 beers per week typically on the weekends and denies any recreational drug use.  Her main concern at this time is getting her diabetes controlled.   All recent lab results were discussed in detail with the patient today.  Ferritin slightly elevated Vitamin D low at 19.1 Slightly elevated calcium level, normal phosphorus All other labs were grossly normal.  Her medication refills are up to date. 3 months now has been having dryness in mouth and back of throat, discussed ways to alleviate dry mouth and dry throat.      Current Medication: Outpatient Encounter Medications as of 07/03/2021  Medication Sig   atorvastatin (LIPITOR) 10 MG tablet Take 1 tablet (10 mg total) by mouth daily.   FLUoxetine (PROZAC) 20 MG capsule Take 1 capsule (20 mg total) by mouth daily.   lisinopril-hydrochlorothiazide (ZESTORETIC) 20-25 MG tablet Take 1 tablet by mouth every morning.   metFORMIN (GLUCOPHAGE-XR) 500 MG 24 hr tablet Take 1 tablet (500 mg total) by mouth daily with breakfast.   omeprazole (PRILOSEC) 20 MG capsule Take 20 mg by mouth daily.   Semaglutide,0.25 or 0.'5MG'$ /DOS, 2 MG/3ML SOPN Inject 0.5 mg into the skin once a week.   triamcinolone cream (KENALOG) 0.1 %  Apply 1 application. topically 2 (two) times daily. To affected area until resolved.   Vitamin D, Ergocalciferol, (DRISDOL) 1.25 MG (50000 UNIT) CAPS capsule Take 1 capsule (50,000 Units total) by mouth every 7 (seven) days.   Zoster Vaccine Adjuvanted [facility]) injection Inject 0.5 mLs into the muscle once.   No facility-administered encounter medications on file as of 07/03/2021.    Surgical History: Past Surgical History:  Procedure Laterality Date   BREAST BIOPSY Right 2014   benign   COLONOSCOPY     COLONOSCOPY WITH PROPOFOL N/A 10/01/2017   Procedure: COLONOSCOPY WITH PROPOFOL;  Surgeon: Virgel Manifold, MD;  Location: [facility] ENDOSCOPY;  Service: Endoscopy;  Laterality: N/A;   ESOPHAGOGASTRODUODENOSCOPY N/A 02/26/2017   Procedure: ESOPHAGOGASTRODUODENOSCOPY (EGD);  Surgeon: Virgel Manifold, MD;  Location: [facility] ENDOSCOPY;  Service: Endoscopy;  Laterality: N/A;   ESOPHAGOGASTRODUODENOSCOPY (EGD) WITH PROPOFOL N/A 10/01/2017   Procedure: ESOPHAGOGASTRODUODENOSCOPY (EGD) WITH PROPOFOL;  Surgeon: Virgel Manifold, MD;  Location: [facility] ENDOSCOPY;  Service: Endoscopy;  Laterality: N/A;   UPPER GASTROINTESTINAL ENDOSCOPY      Medical History: Past Medical History:  Diagnosis Date   Acid reflux    Acute gastric ulcer with hemorrhage    Acute posthemorrhagic anemia    Anxiety disorder    Benign hypertensive renal disease    Chronic peptic ulcer of stomach    Colon polyp    Controlled type 2 diabetes mellitus with renal manifestation ([place])    Controlled type 2 diabetes mellitus with renal manifestation ([place])  Frequent headaches    Heart murmur    Hypertension    Iron deficiency anemia    Lower GI bleed 02/25/2017   Pre-diabetes    Stomach irritation     Family History: Family History  Problem Relation Age of Onset   Hypertension Mother    Hyperlipidemia Mother    Glaucoma Mother    Depression Mother    Liver disease Father    Cancer Father    Glaucoma Sister     Glaucoma Sister    Vision loss Sister    Dementia Maternal Grandmother    Hypertension Brother    Hypertension Brother    Kidney disease Brother    Vision loss Brother    Breast cancer Neg Hx     Social History   Socioeconomic History   Marital status: Single    Spouse name: Not on file   Number of children: Not on file   Years of education: Not on file   Highest education level: Not on file  Occupational History   Not on file  Tobacco Use   Smoking status: Never   Smokeless tobacco: Never  Vaping Use   Vaping Use: Never used  Substance and Sexual Activity   Alcohol use: Yes    Alcohol/week: 3.0 standard drinks    Types: 3 Cans of beer per week    Comment: on the weekends   Drug use: No   Sexual activity: Not Currently    Birth control/protection: Abstinence  Other Topics Concern   Not on file  Social History Narrative   Not on file   Social Determinants of Health   Financial Resource Strain: Not on file  Food Insecurity: Not on file  Transportation Needs: Not on file  Physical Activity: Not on file  Stress: Not on file  Social Connections: Not on file  Intimate Partner Violence: Not on file      Review of Systems  Constitutional:  Negative for activity change, appetite change, chills, fatigue, fever and unexpected weight change.  HENT: Negative.  Negative for congestion, ear pain, rhinorrhea, sore throat and trouble swallowing.   Eyes: Negative.   Respiratory:  Positive for cough. Negative for chest tightness, shortness of breath and wheezing.   Cardiovascular: Negative.  Negative for chest pain.  Gastrointestinal: Negative.  Negative for abdominal pain, blood in stool, constipation, diarrhea, nausea and vomiting.  Endocrine: Negative.   Genitourinary: Negative.  Negative for difficulty urinating, dysuria, frequency, hematuria and urgency.  Musculoskeletal: Negative.  Negative for arthralgias, back pain, joint swelling, myalgias and neck pain.  Skin:  Negative.  Negative for rash and wound.  Allergic/Immunologic: Negative.  Negative for immunocompromised state.  Neurological: Negative.  Negative for dizziness, seizures, numbness and headaches.  Hematological: Negative.   Psychiatric/Behavioral: Negative.  Negative for behavioral problems, self-injury and suicidal ideas. The patient is not nervous/anxious.    Vital Signs: BP 127/73   Pulse 87   Temp 98.4 F (36.9 C)   Resp 16   Ht '5\' 5"'$  (1.651 m)   Wt 192 lb 3.2 oz (87.2 kg)   LMP 09/01/2014 (Approximate)   SpO2 98%   BMI 31.98 kg/m    Physical Exam Vitals reviewed.  Constitutional:      General: She is not in acute distress.    Appearance: Normal appearance. She is well-developed. She is obese. She is not ill-appearing or diaphoretic.  HENT:     Head: Normocephalic and atraumatic.     Right Ear: Tympanic membrane, ear  canal and external ear normal.     Left Ear: Tympanic membrane and external ear normal.     Nose: Nose normal. No congestion or rhinorrhea.     Mouth/Throat:     Mouth: Mucous membranes are moist.     Pharynx: Oropharynx is clear. No oropharyngeal exudate or posterior oropharyngeal erythema.  Eyes:     General: No scleral icterus.       Right eye: No discharge.        Left eye: No discharge.     Extraocular Movements: Extraocular movements intact.     Conjunctiva/sclera: Conjunctivae normal.     Pupils: Pupils are equal, round, and reactive to light.  Neck:     Thyroid: No thyromegaly.     Vascular: No JVD.     Trachea: No tracheal deviation.  Cardiovascular:     Rate and Rhythm: Normal rate and regular rhythm.     Heart sounds: Normal heart sounds. No murmur heard.   No friction rub. No gallop.  Pulmonary:     Effort: Pulmonary effort is normal. No respiratory distress.     Breath sounds: Normal breath sounds. No stridor. No wheezing or rales.  Chest:     Chest wall: No tenderness.  Abdominal:     General: Bowel sounds are normal. There is no  distension.     Palpations: Abdomen is soft. There is no mass.     Tenderness: There is no abdominal tenderness. There is no guarding or rebound.  Musculoskeletal:        General: No tenderness or deformity. Normal range of motion.     Cervical back: Normal range of motion and neck supple.  Lymphadenopathy:     Cervical: No cervical adenopathy.  Skin:    General: Skin is warm and dry.     Capillary Refill: Capillary refill takes less than 2 seconds.     Coloration: Skin is not pale.     Findings: No erythema or rash.  Neurological:     Mental Status: She is alert and oriented to person, place, and time.     Cranial Nerves: No cranial nerve deficit.     Motor: No abnormal muscle tone.     Coordination: Coordination normal.     Deep Tendon Reflexes: Reflexes are normal and symmetric.  Psychiatric:        Mood and Affect: Mood normal.        Behavior: Behavior normal.        Thought Content: Thought content normal.        Judgment: Judgment normal.       Assessment/Plan: 1. Encounter for routine adult health examination with abnormal findings Age-appropriate preventive screenings and vaccinations discussed, annual physical exam completed. Routine labs for health maintenance drawn prior to office visit, results discuss with patient today.  PHM updated.   2. Type 2 diabetes mellitus with hyperglycemia, without long-term current use of insulin (HCC) Continue ozempic, dose adjusted to 0.5 mg weekly - Semaglutide,0.25 or 0.'5MG'$ /DOS, 2 MG/3ML SOPN; Inject 0.5 mg into the skin once a week.  Dispense: 3 mL; Refill: 1  3. Vitamin D deficiency Start weekly vitamin D supplement as prescribed, will repeat vitamin D level in 6 months.  - Vitamin D, Ergocalciferol, (DRISDOL) 1.25 MG (50000 UNIT) CAPS capsule; Take 1 capsule (50,000 Units total) by mouth every 7 (seven) days.  Dispense: 12 capsule; Refill: 1  4. Dysuria Routine urinalysis done  - UA/M w/rflx Culture, Routine - Microscopic  Examination  5. Need for vaccination - Zoster Vaccine Adjuvanted [facility]) injection; Inject 0.5 mLs into the muscle once.      General Counseling: rhett najera understanding of the findings of todays visit and agrees with plan of treatment. I have discussed any further diagnostic evaluation that may be needed or ordered today. We also reviewed her medications today. she has been encouraged to call the office with any questions or concerns that should arise related to todays visit.    Orders Placed This Encounter  Procedures   UA/M w/rflx Culture, Routine    Meds ordered this encounter  Medications   Semaglutide,0.25 or 0.'5MG'$ /DOS, 2 MG/3ML SOPN    Sig: Inject 0.5 mg into the skin once a week.    Dispense:  3 mL    Refill:  1    New medication, please discontinue victoza, and fill new prescription ASAP, please let us know if a prior authorization is needed and we will complete it.   Vitamin D, Ergocalciferol, (DRISDOL) 1.25 MG (50000 UNIT) CAPS capsule    Sig: Take 1 capsule (50,000 Units total) by mouth every 7 (seven) days.    Dispense:  12 capsule    Refill:  1    Return in about 2 months (around 09/02/2021) for F/U, Recheck A1C, Aydee Mcnew PCP.   Total time spent:30 Minutes Time spent includes review of chart, medications, test results, and follow up plan with the patient.   [place] Controlled Substance Database was reviewed by me.  This patient was seen by Jonetta Osgood, FNP-C in collaboration with Dr. Clayborn Bigness as a part of collaborative care agreement.  Lavaughn Bisig R. Valetta Fuller, MSN, FNP-C Internal medicine

## 2021-07-04 LAB — UA/M W/RFLX CULTURE, ROUTINE
Bilirubin, UA: NEGATIVE
Glucose, UA: NEGATIVE
Leukocytes,UA: NEGATIVE
Nitrite, UA: NEGATIVE
RBC, UA: NEGATIVE
Specific Gravity, UA: 1.026 (ref 1.005–1.030)
Urobilinogen, Ur: 1 mg/dL (ref 0.2–1.0)
pH, UA: 6.5 (ref 5.0–7.5)

## 2021-07-04 LAB — MICROSCOPIC EXAMINATION
Bacteria, UA: NONE SEEN
Casts: NONE SEEN /lpf
WBC, UA: NONE SEEN /hpf (ref 0–5)

## 2021-07-17 ENCOUNTER — Encounter: Payer: Self-pay | Admitting: Nurse Practitioner

## 2021-08-05 ENCOUNTER — Other Ambulatory Visit: Payer: Self-pay | Admitting: Nurse Practitioner

## 2021-08-05 DIAGNOSIS — I129 Hypertensive chronic kidney disease with stage 1 through stage 4 chronic kidney disease, or unspecified chronic kidney disease: Secondary | ICD-10-CM

## 2021-08-06 ENCOUNTER — Other Ambulatory Visit: Payer: Self-pay

## 2021-08-06 MED ORDER — LISINOPRIL-HYDROCHLOROTHIAZIDE 20-25 MG PO TABS
1.0000 | ORAL_TABLET | Freq: Every morning | ORAL | 1 refills | Status: DC
  Filled 2021-08-06: qty 90, 90d supply, fill #0

## 2021-08-08 ENCOUNTER — Other Ambulatory Visit: Payer: Self-pay

## 2021-08-22 ENCOUNTER — Encounter: Payer: Self-pay | Admitting: Nurse Practitioner

## 2021-09-04 ENCOUNTER — Ambulatory Visit (INDEPENDENT_AMBULATORY_CARE_PROVIDER_SITE_OTHER): Payer: BLUE CROSS/BLUE SHIELD | Admitting: Nurse Practitioner

## 2021-09-04 ENCOUNTER — Encounter: Payer: Self-pay | Admitting: Nurse Practitioner

## 2021-09-04 VITALS — BP 125/74 | HR 70 | Temp 98.5°F | Resp 16 | Ht 65.0 in | Wt 192.6 lb

## 2021-09-04 DIAGNOSIS — F411 Generalized anxiety disorder: Secondary | ICD-10-CM | POA: Diagnosis not present

## 2021-09-04 DIAGNOSIS — R0981 Nasal congestion: Secondary | ICD-10-CM

## 2021-09-04 DIAGNOSIS — I129 Hypertensive chronic kidney disease with stage 1 through stage 4 chronic kidney disease, or unspecified chronic kidney disease: Secondary | ICD-10-CM

## 2021-09-04 DIAGNOSIS — E1165 Type 2 diabetes mellitus with hyperglycemia: Secondary | ICD-10-CM | POA: Diagnosis not present

## 2021-09-04 DIAGNOSIS — R058 Other specified cough: Secondary | ICD-10-CM | POA: Diagnosis not present

## 2021-09-04 LAB — POCT GLYCOSYLATED HEMOGLOBIN (HGB A1C): Hemoglobin A1C: 6.2 % — AB (ref 4.0–5.6)

## 2021-09-04 MED ORDER — SEMAGLUTIDE(0.25 OR 0.5MG/DOS) 2 MG/3ML ~~LOC~~ SOPN: 0.5000 mg | PEN_INJECTOR | SUBCUTANEOUS | 5 refills | Status: DC

## 2021-09-04 MED ORDER — HYDROCOD POLI-CHLORPHE POLI ER 10-8 MG/5ML PO SUER: 5.0000 mL | Freq: Two times a day (BID) | ORAL | 0 refills | Status: DC | PRN

## 2021-09-04 MED ORDER — SEMAGLUTIDE (1 MG/DOSE) 4 MG/3ML ~~LOC~~ SOPN: 1.0000 mg | PEN_INJECTOR | SUBCUTANEOUS | 4 refills | Status: DC

## 2021-09-04 NOTE — Progress Notes (Signed)
[facility] [place], [place] [address]  Internal MEDICINE  Office Visit Note  Patient Name: Tabitha Evans  [id number]  [id number]  Date of Service: 09/04/2021  Chief Complaint  Patient presents with   Follow-up   Diabetes    Discuss meds   Gastroesophageal Reflux   Hypertension   Anemia   Anxiety    HPI Tabitha Evans presents for follow-up visit for diabetes, hypertension, and anxiety.  She reports a cough with nasal congestion and denies any other symptoms including fever, fatigue, sore throat, ear pain, headaches or chills. -cough is worse at night and is making it hard to sleep. Diabetes --A1c is 6.2 today which is improved from 7.0 in May.  She is doing well on Ozempic and wants to continue.  Denies any adverse side effects. Hypertension --blood pressure is stable and well-controlled, no changes Anxiety --continues to take fluoxetine 20 mg daily and reports that this dose remains effective for her Cough with nasal congestion --no other signs of significant infectious process at this time, reports drainage is clear.  Is just requesting medication to control the cough so she can sleep better at night.     Current Medication: Outpatient Encounter Medications as of 09/04/2021  Medication Sig   atorvastatin (LIPITOR) 10 MG tablet Take 1 tablet (10 mg total) by mouth daily.   chlorpheniramine-HYDROcodone (TUSSIONEX PENNKINETIC ER) 10-8 MG/5ML Take 5 mLs by mouth every 12 (twelve) hours as needed for cough.   FLUoxetine (PROZAC) 20 MG capsule Take 1 capsule (20 mg total) by mouth daily.   lisinopril-hydrochlorothiazide (ZESTORETIC) 20-25 MG tablet Take 1 tablet by mouth every morning.   metFORMIN (GLUCOPHAGE-XR) 500 MG 24 hr tablet Take 1 tablet (500 mg total) by mouth daily with breakfast.   omeprazole (PRILOSEC) 20 MG capsule Take 20 mg by mouth daily.   Semaglutide, 1 MG/DOSE, 4 MG/3ML SOPN Inject 1 mg as directed once a week.   triamcinolone cream  (KENALOG) 0.1 % Apply 1 application. topically 2 (two) times daily. To affected area until resolved.   Vitamin D, Ergocalciferol, (DRISDOL) 1.25 MG (50000 UNIT) CAPS capsule Take 1 capsule (50,000 Units total) by mouth every 7 (seven) days.   Zoster Vaccine Adjuvanted [facility]) injection Inject 0.5 mLs into the muscle once.   [DISCONTINUED] Semaglutide,0.25 or 0.'5MG'$ /DOS, 2 MG/3ML SOPN Inject 0.5 mg into the skin once a week.   [DISCONTINUED] Semaglutide,0.25 or 0.'5MG'$ /DOS, 2 MG/3ML SOPN Inject 0.5 mg into the skin once a week.   No facility-administered encounter medications on file as of 09/04/2021.    Surgical History: Past Surgical History:  Procedure Laterality Date   BREAST BIOPSY Right 2014   benign   COLONOSCOPY     COLONOSCOPY WITH PROPOFOL N/A 10/01/2017   Procedure: COLONOSCOPY WITH PROPOFOL;  Surgeon: Virgel Manifold, MD;  Location: [facility] ENDOSCOPY;  Service: Endoscopy;  Laterality: N/A;   ESOPHAGOGASTRODUODENOSCOPY N/A 02/26/2017   Procedure: ESOPHAGOGASTRODUODENOSCOPY (EGD);  Surgeon: Virgel Manifold, MD;  Location: [facility] ENDOSCOPY;  Service: Endoscopy;  Laterality: N/A;   ESOPHAGOGASTRODUODENOSCOPY (EGD) WITH PROPOFOL N/A 10/01/2017   Procedure: ESOPHAGOGASTRODUODENOSCOPY (EGD) WITH PROPOFOL;  Surgeon: Virgel Manifold, MD;  Location: [facility] ENDOSCOPY;  Service: Endoscopy;  Laterality: N/A;   UPPER GASTROINTESTINAL ENDOSCOPY      Medical History: Past Medical History:  Diagnosis Date   Acid reflux    Acute gastric ulcer with hemorrhage    Acute posthemorrhagic anemia    Anxiety disorder    Benign hypertensive renal disease  Chronic peptic ulcer of stomach    Colon polyp    Controlled type 2 diabetes mellitus with renal manifestation (HCC)    Controlled type 2 diabetes mellitus with renal manifestation (HCC)    Frequent headaches    Heart murmur    Hypertension    Iron deficiency anemia    Lower GI bleed 02/25/2017   Pre-diabetes    Stomach irritation      Family History: Family History  Problem Relation Age of Onset   Hypertension Mother    Hyperlipidemia Mother    Glaucoma Mother    Depression Mother    Liver disease Father    Cancer Father    Glaucoma Sister    Glaucoma Sister    Vision loss Sister    Dementia Maternal Grandmother    Hypertension Brother    Hypertension Brother    Kidney disease Brother    Vision loss Brother    Breast cancer Neg Hx     Social History   Socioeconomic History   Marital status: Single    Spouse name: Not on file   Number of children: Not on file   Years of education: Not on file   Highest education level: Not on file  Occupational History   Not on file  Tobacco Use   Smoking status: Never   Smokeless tobacco: Never  Vaping Use   Vaping Use: Never used  Substance and Sexual Activity   Alcohol use: Yes    Alcohol/week: 3.0 standard drinks of alcohol    Types: 3 Cans of beer per week    Comment: on the weekends   Drug use: No   Sexual activity: Not Currently    Birth control/protection: Abstinence  Other Topics Concern   Not on file  Social History Narrative   Not on file   Social Determinants of Health   Financial Resource Strain: Not on file  Food Insecurity: Not on file  Transportation Needs: Not on file  Physical Activity: Not on file  Stress: Not on file  Social Connections: Not on file  Intimate Partner Violence: Not on file      Review of Systems  Constitutional:  Negative for chills, fatigue and unexpected weight change.  HENT:  Positive for congestion and postnasal drip. Negative for ear pain, rhinorrhea, sneezing and sore throat.   Eyes:  Negative for redness.  Respiratory:  Positive for cough. Negative for chest tightness, shortness of breath and wheezing.   Cardiovascular: Negative.  Negative for chest pain and palpitations.  Gastrointestinal: Negative.  Negative for abdominal pain, constipation, diarrhea, nausea and vomiting.  Genitourinary:  Negative  for dysuria and frequency.  Musculoskeletal:  Negative for arthralgias, back pain, joint swelling and neck pain.  Skin:  Negative for rash.  Neurological: Negative.  Negative for tremors, numbness and headaches.  Hematological:  Negative for adenopathy. Does not bruise/bleed easily.  Psychiatric/Behavioral:  Negative for behavioral problems (Depression), self-injury, sleep disturbance and suicidal ideas. The patient is nervous/anxious (takes fluoxetine).     Vital Signs: BP 125/74   Pulse 70   Temp 98.5 F (36.9 C)   Resp 16   Ht '5\' 5"'$  (1.651 m)   Wt 192 lb 9.6 oz (87.4 kg)   LMP 09/01/2014 (Approximate)   SpO2 99%   BMI 32.05 kg/m    Physical Exam Vitals reviewed.  Constitutional:      General: She is not in acute distress.    Appearance: Normal appearance. She is obese. She is not  ill-appearing.  HENT:     Head: Normocephalic and atraumatic.     Right Ear: Tympanic membrane, ear canal and external ear normal.     Left Ear: Tympanic membrane, ear canal and external ear normal.     Nose: Congestion present.     Mouth/Throat:     Mouth: Mucous membranes are moist.     Pharynx: Oropharynx is clear. No oropharyngeal exudate or posterior oropharyngeal erythema.  Eyes:     Pupils: Pupils are equal, round, and reactive to light.  Cardiovascular:     Rate and Rhythm: Normal rate and regular rhythm.     Heart sounds: Normal heart sounds. No murmur heard. Pulmonary:     Effort: Pulmonary effort is normal. No respiratory distress.     Breath sounds: Normal breath sounds. No wheezing.  Neurological:     Mental Status: She is alert and oriented to person, place, and time.  Psychiatric:        Mood and Affect: Mood normal.        Behavior: Behavior normal.        Assessment/Plan: 1. Type 2 diabetes mellitus with hyperglycemia, without long-term current use of insulin (HCC) A1c continues to improve down to 6.2 today.  Ozempic dose increased to 1 mg weekly.  Will repeat A1c  in 3 months - POCT HgB A1C - Semaglutide, 1 MG/DOSE, 4 MG/3ML SOPN; Inject 1 mg as directed once a week.  Dispense: 3 mL; Refill: 4  2. Cough with congestion of paranasal sinus Medication prescribed for symptomatic relief of cough and congestion.  Patient does have environmental allergies and allergic rhinitis and reports that these are the symptoms she has sometimes.  She has no other symptoms of active infectious process at this time.  Encourage patient to call the clinic if she starts to develop further symptoms or her cough gets significantly worse and does not seem to be due to her environmental allergies anymore - chlorpheniramine-HYDROcodone (TUSSIONEX PENNKINETIC ER) 10-8 MG/5ML; Take 5 mLs by mouth every 12 (twelve) hours as needed for cough.  Dispense: 140 mL; Refill: 0  3. Benign hypertensive renal disease Blood pressure is well controlled with current medications.  4. Generalized anxiety disorder Stable with fluoxetine 20 mg daily, no changes.   General Counseling: Tabitha Evans understanding of the findings of todays visit and agrees with plan of treatment. I have discussed any further diagnostic evaluation that may be needed or ordered today. We also reviewed her medications today. she has been encouraged to call the office with any questions or concerns that should arise related to todays visit.    Orders Placed This Encounter  Procedures   POCT HgB A1C    Meds ordered this encounter  Medications   DISCONTD: Semaglutide,0.25 or 0.'5MG'$ /DOS, 2 MG/3ML SOPN    Sig: Inject 0.5 mg into the skin once a week.    Dispense:  3 mL    Refill:  5    For future refills   chlorpheniramine-HYDROcodone (TUSSIONEX PENNKINETIC ER) 10-8 MG/5ML    Sig: Take 5 mLs by mouth every 12 (twelve) hours as needed for cough.    Dispense:  140 mL    Refill:  0   Semaglutide, 1 MG/DOSE, 4 MG/3ML SOPN    Sig: Inject 1 mg as directed once a week.    Dispense:  3 mL    Refill:  4    Please  discontinue previous orders for semaglutide and fill new prescription asap. Dx code E11.65  Return in about 3 months (around 12/05/2021) for F/U, Recheck A1C, Tabitha Evans PCP.   Total time spent:30 Minutes Time spent includes review of chart, medications, test results, and follow up plan with the patient.   [place] Controlled Substance Database was reviewed by me.  This patient was seen by Jonetta Osgood, FNP-C in collaboration with Dr. Clayborn Bigness as a part of collaborative care agreement.   Brinn Westby R. Valetta Fuller, MSN, FNP-C Internal medicine

## 2021-10-13 ENCOUNTER — Encounter: Payer: Self-pay | Admitting: Nurse Practitioner

## 2021-11-17 ENCOUNTER — Other Ambulatory Visit: Payer: Self-pay

## 2021-11-17 ENCOUNTER — Telehealth: Payer: Self-pay

## 2021-11-17 NOTE — Telephone Encounter (Signed)
Faxed PA for ozempic to [employer] [facility]

## 2021-11-25 ENCOUNTER — Telehealth: Payer: Self-pay

## 2021-12-01 MED ORDER — DULAGLUTIDE 1.5 MG/0.5ML ~~LOC~~ SOAJ: 1.5000 mg | SUBCUTANEOUS | 3 refills | Status: DC

## 2021-12-02 NOTE — Telephone Encounter (Signed)
Pt advised we send Dulaglutide

## 2021-12-04 ENCOUNTER — Ambulatory Visit (INDEPENDENT_AMBULATORY_CARE_PROVIDER_SITE_OTHER): Payer: BLUE CROSS/BLUE SHIELD | Admitting: Nurse Practitioner

## 2021-12-04 ENCOUNTER — Encounter: Payer: Self-pay | Admitting: Nurse Practitioner

## 2021-12-04 VITALS — BP 122/78 | HR 81 | Temp 97.7°F | Resp 16 | Ht 65.0 in | Wt 186.8 lb

## 2021-12-04 DIAGNOSIS — E1165 Type 2 diabetes mellitus with hyperglycemia: Secondary | ICD-10-CM | POA: Diagnosis not present

## 2021-12-04 DIAGNOSIS — E6609 Other obesity due to excess calories: Secondary | ICD-10-CM | POA: Diagnosis not present

## 2021-12-04 DIAGNOSIS — Z6831 Body mass index (BMI) 31.0-31.9, adult: Secondary | ICD-10-CM | POA: Diagnosis not present

## 2021-12-04 DIAGNOSIS — I129 Hypertensive chronic kidney disease with stage 1 through stage 4 chronic kidney disease, or unspecified chronic kidney disease: Secondary | ICD-10-CM

## 2021-12-04 LAB — POCT GLYCOSYLATED HEMOGLOBIN (HGB A1C): Hemoglobin A1C: 5.7 % — AB (ref 4.0–5.6)

## 2021-12-04 MED ORDER — TIRZEPATIDE 5 MG/0.5ML ~~LOC~~ SOAJ: 5.0000 mg | SUBCUTANEOUS | 1 refills | Status: DC

## 2021-12-04 NOTE — Progress Notes (Signed)
[facility] [place], [place] [address]  Internal MEDICINE  Office Visit Note  Patient Name: Tabitha Evans  [id number]  [id number]  Date of Service: 12/04/2021  Chief Complaint  Patient presents with   Follow-up   Diabetes   Gastroesophageal Reflux   Hypertension    HPI Karene presents for a follow up visit for diabetes Improved a1c to 5.7 Insurance did not approve ozempic, need alternative  BP is doing well  Lost 6 more lbs.      Current Medication: Outpatient Encounter Medications as of 12/04/2021  Medication Sig   atorvastatin (LIPITOR) 10 MG tablet Take 1 tablet (10 mg total) by mouth daily.   chlorpheniramine-HYDROcodone (TUSSIONEX PENNKINETIC ER) 10-8 MG/5ML Take 5 mLs by mouth every 12 (twelve) hours as needed for cough.   FLUoxetine (PROZAC) 20 MG capsule Take 1 capsule (20 mg total) by mouth daily.   lisinopril-hydrochlorothiazide (ZESTORETIC) 20-25 MG tablet Take 1 tablet by mouth every morning.   metFORMIN (GLUCOPHAGE-XR) 500 MG 24 hr tablet Take 1 tablet (500 mg total) by mouth daily with breakfast.   omeprazole (PRILOSEC) 20 MG capsule Take 20 mg by mouth daily.   tirzepatide [facility]) 5 MG/0.5ML Pen Inject 5 mg into the skin once a week.   triamcinolone cream (KENALOG) 0.1 % Apply 1 application. topically 2 (two) times daily. To affected area until resolved.   Vitamin D, Ergocalciferol, (DRISDOL) 1.25 MG (50000 UNIT) CAPS capsule Take 1 capsule (50,000 Units total) by mouth every 7 (seven) days.   Zoster Vaccine Adjuvanted [facility]) injection Inject 0.5 mLs into the muscle once.   [DISCONTINUED] Dulaglutide 1.5 MG/0.5ML SOPN Inject 1.5 mg into the skin once a week.   No facility-administered encounter medications on file as of 12/04/2021.    Surgical History: Past Surgical History:  Procedure Laterality Date   BREAST BIOPSY Right 2014   benign   COLONOSCOPY     COLONOSCOPY WITH PROPOFOL N/A 10/01/2017   Procedure:  COLONOSCOPY WITH PROPOFOL;  Surgeon: Virgel Manifold, MD;  Location: [facility] ENDOSCOPY;  Service: Endoscopy;  Laterality: N/A;   ESOPHAGOGASTRODUODENOSCOPY N/A 02/26/2017   Procedure: ESOPHAGOGASTRODUODENOSCOPY (EGD);  Surgeon: Virgel Manifold, MD;  Location: [facility] ENDOSCOPY;  Service: Endoscopy;  Laterality: N/A;   ESOPHAGOGASTRODUODENOSCOPY (EGD) WITH PROPOFOL N/A 10/01/2017   Procedure: ESOPHAGOGASTRODUODENOSCOPY (EGD) WITH PROPOFOL;  Surgeon: Virgel Manifold, MD;  Location: [facility] ENDOSCOPY;  Service: Endoscopy;  Laterality: N/A;   UPPER GASTROINTESTINAL ENDOSCOPY      Medical History: Past Medical History:  Diagnosis Date   Acid reflux    Acute gastric ulcer with hemorrhage    Acute posthemorrhagic anemia    Anxiety disorder    Benign hypertensive renal disease    Chronic peptic ulcer of stomach    Colon polyp    Controlled type 2 diabetes mellitus with renal manifestation (HCC)    Controlled type 2 diabetes mellitus with renal manifestation (HCC)    Frequent headaches    Heart murmur    Hypertension    Iron deficiency anemia    Lower GI bleed 02/25/2017   Pre-diabetes    Stomach irritation     Family History: Family History  Problem Relation Age of Onset   Hypertension Mother    Hyperlipidemia Mother    Glaucoma Mother    Depression Mother    Liver disease Father    Cancer Father    Glaucoma Sister    Glaucoma Sister    Vision loss Sister    Dementia  Maternal Grandmother    Hypertension Brother    Hypertension Brother    Kidney disease Brother    Vision loss Brother    Breast cancer Neg Hx     Social History   Socioeconomic History   Marital status: Single    Spouse name: Not on file   Number of children: Not on file   Years of education: Not on file   Highest education level: Not on file  Occupational History   Not on file  Tobacco Use   Smoking status: Never   Smokeless tobacco: Never  Vaping Use   Vaping Use: Never used  Substance and  Sexual Activity   Alcohol use: Not Currently    Alcohol/week: 3.0 standard drinks of alcohol    Types: 3 Cans of beer per week    Comment: on the weekends   Drug use: No   Sexual activity: Not Currently    Birth control/protection: Abstinence  Other Topics Concern   Not on file  Social History Narrative   Not on file   Social Determinants of Health   Financial Resource Strain: Not on file  Food Insecurity: Not on file  Transportation Needs: Not on file  Physical Activity: Not on file  Stress: Not on file  Social Connections: Not on file  Intimate Partner Violence: Not on file      Review of Systems  Constitutional:  Negative for chills, fatigue and unexpected weight change.  HENT:  Positive for congestion and postnasal drip. Negative for ear pain, rhinorrhea, sneezing and sore throat.   Eyes:  Negative for redness.  Respiratory:  Positive for cough. Negative for chest tightness, shortness of breath and wheezing.   Cardiovascular: Negative.  Negative for chest pain and palpitations.  Gastrointestinal: Negative.  Negative for abdominal pain, constipation, diarrhea, nausea and vomiting.  Genitourinary:  Negative for dysuria and frequency.  Musculoskeletal:  Negative for arthralgias, back pain, joint swelling and neck pain.  Skin:  Negative for rash.  Neurological: Negative.  Negative for tremors, numbness and headaches.  Hematological:  Negative for adenopathy. Does not bruise/bleed easily.  Psychiatric/Behavioral:  Negative for behavioral problems (Depression), self-injury, sleep disturbance and suicidal ideas. The patient is nervous/anxious (takes fluoxetine).     Vital Signs: BP 122/78   Pulse 81   Temp 97.7 F (36.5 C)   Resp 16   Ht '5\' 5"'$  (1.651 m)   Wt 186 lb 12.8 oz (84.7 kg)   LMP 09/01/2014 (Approximate)   SpO2 99%   BMI 31.09 kg/m    Physical Exam Vitals reviewed.  Constitutional:      General: She is not in acute distress.    Appearance: Normal  appearance. She is obese. She is not ill-appearing.  HENT:     Head: Normocephalic and atraumatic.     Right Ear: Tympanic membrane, ear canal and external ear normal.     Left Ear: Tympanic membrane, ear canal and external ear normal.     Nose: Congestion present.     Mouth/Throat:     Mouth: Mucous membranes are moist.     Pharynx: Oropharynx is clear. No oropharyngeal exudate or posterior oropharyngeal erythema.  Eyes:     Pupils: Pupils are equal, round, and reactive to light.  Cardiovascular:     Rate and Rhythm: Normal rate and regular rhythm.     Heart sounds: Normal heart sounds. No murmur heard. Pulmonary:     Effort: Pulmonary effort is normal. No respiratory distress.  Breath sounds: Normal breath sounds. No wheezing.  Neurological:     Mental Status: She is alert and oriented to person, place, and time.  Psychiatric:        Mood and Affect: Mood normal.        Behavior: Behavior normal.        Assessment/Plan: 1. Type 2 diabetes mellitus with hyperglycemia, without long-term current use of insulin ([place]) Ozempic not covered by insurance, will try to get mounjaro approved, sample provided today. A1c continues to improve - POCT glycosylated hemoglobin (Hb A1C) - tirzepatide (MOUNJARO) 5 MG/0.5ML Pen; Inject 5 mg into the skin once a week.  Dispense: 6 mL; Refill: 1  2. Benign hypertensive renal disease Stable, continue medications as prescribed.   3. Class 1 obesity due to excess calories with serious comorbidity and body mass index (BMI) of 31.0 to 31.9 in adult Lost 6 more lbs, switching to mounjaro, hopefully will see further weight loss.    General Counseling: audreanna torrisi understanding of the findings of todays visit and agrees with plan of treatment. I have discussed any further diagnostic evaluation that may be needed or ordered today. We also reviewed her medications today. she has been encouraged to call the office with any questions or concerns  that should arise related to todays visit.    Orders Placed This Encounter  Procedures   POCT glycosylated hemoglobin (Hb A1C)    Meds ordered this encounter  Medications   tirzepatide (MOUNJARO) 5 MG/0.5ML Pen    Sig: Inject 5 mg into the skin once a week.    Dispense:  6 mL    Refill:  1    Dx E11.65, may need prior auth please send request ASAP if this is so.    Return in about 2 months (around 02/03/2022) for F/U, eval new med, [place] PCP.   Total time spent:30 Minutes Time spent includes review of chart, medications, test results, and follow up plan with the patient.   [place] Controlled Substance Database was reviewed by me.  This patient was seen by Jonetta Osgood, FNP-C in collaboration with Dr. Clayborn Bigness as a part of collaborative care agreement.   Charnice Zwilling R. Valetta Fuller, MSN, FNP-C Internal medicine

## 2021-12-16 ENCOUNTER — Other Ambulatory Visit: Payer: Self-pay | Admitting: Nurse Practitioner

## 2021-12-16 ENCOUNTER — Telehealth: Payer: Self-pay

## 2021-12-16 NOTE — Telephone Encounter (Signed)
Patient's P.A. for Tabitha Evans was denied.

## 2021-12-29 ENCOUNTER — Other Ambulatory Visit: Payer: Self-pay | Admitting: Nurse Practitioner

## 2021-12-29 DIAGNOSIS — Z1231 Encounter for screening mammogram for malignant neoplasm of breast: Secondary | ICD-10-CM

## 2022-01-18 ENCOUNTER — Encounter: Payer: Self-pay | Admitting: Nurse Practitioner

## 2022-02-05 ENCOUNTER — Other Ambulatory Visit: Payer: Self-pay

## 2022-02-05 DIAGNOSIS — E1165 Type 2 diabetes mellitus with hyperglycemia: Secondary | ICD-10-CM

## 2022-02-05 MED ORDER — METFORMIN HCL ER 500 MG PO TB24: 500.0000 mg | ORAL_TABLET | Freq: Every day | ORAL | 3 refills | Status: DC

## 2022-02-06 ENCOUNTER — Ambulatory Visit
Admission: RE | Admit: 2022-02-06 | Discharge: 2022-02-06 | Disposition: A | Discharge status: Home / Self Care | Payer: BLUE CROSS/BLUE SHIELD | Source: Ambulatory Visit | Attending: Nurse Practitioner | Admitting: Nurse Practitioner

## 2022-02-06 DIAGNOSIS — Z1231 Encounter for screening mammogram for malignant neoplasm of breast: Secondary | ICD-10-CM | POA: Insufficient documentation

## 2022-03-06 ENCOUNTER — Encounter: Payer: Self-pay | Admitting: Nurse Practitioner

## 2022-03-06 ENCOUNTER — Ambulatory Visit (INDEPENDENT_AMBULATORY_CARE_PROVIDER_SITE_OTHER): Payer: PRIVATE HEALTH INSURANCE | Admitting: Nurse Practitioner

## 2022-03-06 VITALS — BP 127/80 | HR 70 | Temp 97.5°F | Resp 16 | Ht 65.0 in | Wt 193.6 lb

## 2022-03-06 DIAGNOSIS — E6609 Other obesity due to excess calories: Secondary | ICD-10-CM | POA: Diagnosis not present

## 2022-03-06 DIAGNOSIS — Z6832 Body mass index (BMI) 32.0-32.9, adult: Secondary | ICD-10-CM | POA: Diagnosis not present

## 2022-03-06 DIAGNOSIS — I129 Hypertensive chronic kidney disease with stage 1 through stage 4 chronic kidney disease, or unspecified chronic kidney disease: Secondary | ICD-10-CM | POA: Diagnosis not present

## 2022-03-06 DIAGNOSIS — E1165 Type 2 diabetes mellitus with hyperglycemia: Secondary | ICD-10-CM

## 2022-03-06 LAB — POCT GLYCOSYLATED HEMOGLOBIN (HGB A1C): Hemoglobin A1C: 7.7 % — AB (ref 4.0–5.6)

## 2022-03-06 MED ORDER — TIRZEPATIDE 5 MG/0.5ML ~~LOC~~ SOAJ: 5.0000 mg | SUBCUTANEOUS | 1 refills | Status: DC

## 2022-03-06 NOTE — Progress Notes (Signed)
[facility] [address] [place], [place] [address]  Internal MEDICINE  Office Visit Note  Patient Name: Tabitha Evans  [id number]  [id number]  Date of Service: 03/06/2022  Chief Complaint  Patient presents with   Follow-up   Diabetes   Gastroesophageal Reflux   Hypertension    HPI Zana presents for a follow-up visit for diabetes,   Diabetes -- patient has previously tried trulicity and ozempic. Greggory Keen was denied but is listed as preferred for her insurance. Need to reorder mounjaro and appeal prior authorization Hypertension -- blood pressure is stable on current medications.  Weight loss management -- gained 7 lbs but has not been on her medication since mounjaro was denied.     Current Medication: Outpatient Encounter Medications as of 03/06/2022  Medication Sig   atorvastatin (LIPITOR) 10 MG tablet Take 1 tablet (10 mg total) by mouth daily.   chlorpheniramine-HYDROcodone (TUSSIONEX PENNKINETIC ER) 10-8 MG/5ML Take 5 mLs by mouth every 12 (twelve) hours as needed for cough.   FLUoxetine (PROZAC) 20 MG capsule Take 1 capsule (20 mg total) by mouth daily.   lisinopril-hydrochlorothiazide (ZESTORETIC) 20-25 MG tablet Take 1 tablet by mouth every morning.   metFORMIN (GLUCOPHAGE-XR) 500 MG 24 hr tablet Take 1 tablet (500 mg total) by mouth daily with breakfast.   omeprazole (PRILOSEC) 20 MG capsule Take 20 mg by mouth daily.   triamcinolone cream (KENALOG) 0.1 % Apply 1 application. topically 2 (two) times daily. To affected area until resolved.   Vitamin D, Ergocalciferol, (DRISDOL) 1.25 MG (50000 UNIT) CAPS capsule Take 1 capsule (50,000 Units total) by mouth every 7 (seven) days.   Zoster Vaccine Adjuvanted [facility]) injection Inject 0.5 mLs into the muscle once.   [DISCONTINUED] tirzepatide [facility]) 5 MG/0.5ML Pen Inject 5 mg into the skin once a week.   tirzepatide [facility]) 5 MG/0.5ML Pen Inject 5 mg into the skin once a week.   No  facility-administered encounter medications on file as of 03/06/2022.    Surgical History: Past Surgical History:  Procedure Laterality Date   BREAST BIOPSY Right 2014   benign   COLONOSCOPY     COLONOSCOPY WITH PROPOFOL N/A 10/01/2017   Procedure: COLONOSCOPY WITH PROPOFOL;  Surgeon: Pasty Spillers, MD;  Location: [facility] ENDOSCOPY;  Service: Endoscopy;  Laterality: N/A;   ESOPHAGOGASTRODUODENOSCOPY N/A 02/26/2017   Procedure: ESOPHAGOGASTRODUODENOSCOPY (EGD);  Surgeon: Pasty Spillers, MD;  Location: [facility] ENDOSCOPY;  Service: Endoscopy;  Laterality: N/A;   ESOPHAGOGASTRODUODENOSCOPY (EGD) WITH PROPOFOL N/A 10/01/2017   Procedure: ESOPHAGOGASTRODUODENOSCOPY (EGD) WITH PROPOFOL;  Surgeon: Pasty Spillers, MD;  Location: [facility] ENDOSCOPY;  Service: Endoscopy;  Laterality: N/A;   UPPER GASTROINTESTINAL ENDOSCOPY      Medical History: Past Medical History:  Diagnosis Date   Acid reflux    Acute gastric ulcer with hemorrhage    Acute posthemorrhagic anemia    Anxiety disorder    Benign hypertensive renal disease    Chronic peptic ulcer of stomach    Colon polyp    Controlled type 2 diabetes mellitus with renal manifestation (HCC)    Controlled type 2 diabetes mellitus with renal manifestation (HCC)    Frequent headaches    Heart murmur    Hypertension    Iron deficiency anemia    Lower GI bleed 02/25/2017   Pre-diabetes    Stomach irritation     Family History: Family History  Problem Relation Age of Onset   Hypertension Mother    Hyperlipidemia Mother    Glaucoma Mother  Depression Mother    Liver disease Father    Cancer Father    Glaucoma Sister    Glaucoma Sister    Vision loss Sister    Dementia Maternal Grandmother    Hypertension Brother    Hypertension Brother    Kidney disease Brother    Vision loss Brother    Breast cancer Neg Hx     Social History   Socioeconomic History   Marital status: Single    Spouse name: Not on file   Number of  children: Not on file   Years of education: Not on file   Highest education level: Not on file  Occupational History   Not on file  Tobacco Use   Smoking status: Never   Smokeless tobacco: Never  Vaping Use   Vaping Use: Never used  Substance and Sexual Activity   Alcohol use: Not Currently    Alcohol/week: 3.0 standard drinks of alcohol    Types: 3 Cans of beer per week    Comment: on the weekends   Drug use: No   Sexual activity: Not Currently    Birth control/protection: Abstinence  Other Topics Concern   Not on file  Social History Narrative   Not on file   Social Determinants of Health   Financial Resource Strain: Not on file  Food Insecurity: Not on file  Transportation Needs: Not on file  Physical Activity: Not on file  Stress: Not on file  Social Connections: Not on file  Intimate Partner Violence: Not on file      Review of Systems  Constitutional:  Negative for chills, fatigue and unexpected weight change.  HENT:  Positive for postnasal drip. Negative for congestion, ear pain, rhinorrhea, sneezing and sore throat.   Eyes:  Negative for redness.  Respiratory:  Negative for cough, chest tightness, shortness of breath and wheezing.   Cardiovascular: Negative.  Negative for chest pain and palpitations.  Gastrointestinal: Negative.  Negative for abdominal pain, constipation, diarrhea, nausea and vomiting.  Genitourinary:  Negative for dysuria and frequency.  Musculoskeletal:  Negative for arthralgias, back pain, joint swelling and neck pain.  Skin:  Negative for rash.  Neurological: Negative.  Negative for tremors, numbness and headaches.  Hematological:  Negative for adenopathy. Does not bruise/bleed easily.  Psychiatric/Behavioral:  Negative for behavioral problems (Depression), self-injury, sleep disturbance and suicidal ideas. The patient is nervous/anxious (takes fluoxetine).     Vital Signs: BP 127/80   Pulse 70   Temp (!) 97.5 F (36.4 C)   Resp 16    Ht 5\' 5"  (1.651 m)   Wt 193 lb 9.6 oz (87.8 kg)   LMP 09/01/2014 (Approximate)   SpO2 97%   BMI 32.22 kg/m    Physical Exam Vitals reviewed.  Constitutional:      General: She is not in acute distress.    Appearance: Normal appearance. She is obese. She is not ill-appearing.  HENT:     Head: Normocephalic and atraumatic.  Eyes:     Pupils: Pupils are equal, round, and reactive to light.  Cardiovascular:     Rate and Rhythm: Normal rate and regular rhythm.  Pulmonary:     Effort: Pulmonary effort is normal. No respiratory distress.  Neurological:     Mental Status: She is alert and oriented to person, place, and time.  Psychiatric:        Mood and Affect: Mood normal.        Behavior: Behavior normal.  Assessment/Plan: 1. Type 2 diabetes mellitus with hyperglycemia, without long-term current use of insulin (HCC) A1c elevated to 7.7, reordered mounjaro which is preferred according to her insurance. Will redo prior authorization. - POCT glycosylated hemoglobin (Hb A1C) - tirzepatide (MOUNJARO) 5 MG/0.5ML Pen; Inject 5 mg into the skin once a week.  Dispense: 6 mL; Refill: 1  2. Benign hypertensive renal disease Stable, continue medications as prescribed.   3. Class 1 obesity due to excess calories with serious comorbidity and body mass index (BMI) of 32.0 to 32.9 in adult Weight increased by 7 lbs, this should improve once she is back on the medication for her diabetes.     General Counseling: sharion enslin understanding of the findings of todays visit and agrees with plan of treatment. I have discussed any further diagnostic evaluation that may be needed or ordered today. We also reviewed her medications today. she has been encouraged to call the office with any questions or concerns that should arise related to todays visit.    Orders Placed This Encounter  Procedures   POCT glycosylated hemoglobin (Hb A1C)    Meds ordered this encounter   Medications   tirzepatide (MOUNJARO) 5 MG/0.5ML Pen    Sig: Inject 5 mg into the skin once a week.    Dispense:  6 mL    Refill:  1    Dx E11.65, please send new request for prior authorization, we will redo it.    Return in about 1 month (around 04/06/2022) for F/U, Adrik Khim PCP mounjaro PA.   Total time spent:30 Minutes Time spent includes review of chart, medications, test results, and follow up plan with the patient.    Controlled Substance Database was reviewed by me.  This patient was seen by Sallyanne Kuster, FNP-C in collaboration with Dr. Beverely Risen as a part of collaborative care agreement.   Sharen Youngren R. Tedd Sias, MSN, FNP-C Internal medicine

## 2022-03-07 ENCOUNTER — Encounter: Payer: Self-pay | Admitting: Nurse Practitioner

## 2022-04-10 ENCOUNTER — Encounter: Payer: Self-pay | Admitting: Nurse Practitioner

## 2022-04-10 ENCOUNTER — Ambulatory Visit (INDEPENDENT_AMBULATORY_CARE_PROVIDER_SITE_OTHER): Payer: PRIVATE HEALTH INSURANCE | Admitting: Nurse Practitioner

## 2022-04-10 VITALS — BP 114/69 | HR 88 | Temp 97.2°F | Resp 16 | Ht 65.0 in | Wt 190.2 lb

## 2022-04-10 DIAGNOSIS — E1165 Type 2 diabetes mellitus with hyperglycemia: Secondary | ICD-10-CM | POA: Diagnosis not present

## 2022-04-10 DIAGNOSIS — D224 Melanocytic nevi of scalp and neck: Secondary | ICD-10-CM | POA: Diagnosis not present

## 2022-04-10 DIAGNOSIS — I129 Hypertensive chronic kidney disease with stage 1 through stage 4 chronic kidney disease, or unspecified chronic kidney disease: Secondary | ICD-10-CM

## 2022-04-10 NOTE — Progress Notes (Signed)
[facility] [place], [place] [address]  Internal MEDICINE  Office Visit Note  Patient Name: Tabitha Evans  [id number]  [id number]  Date of Service: 04/10/2022  Chief Complaint  Patient presents with   Follow-up    HPI Nyima presents for a follow-up visit for diabetes, and has a mole/skin tag Used the ozempic sample  Got 3 month supply of mounjaro for $25 and just started that this week Ozempic causes constipation Should get better with mounjaro.  Atypical mole/skin tag on neck -- irritating, hardened and sticking out, tender.  Hypertension -- controlled, on medications.    Current Medication: Outpatient Encounter Medications as of 04/10/2022  Medication Sig   atorvastatin (LIPITOR) 10 MG tablet Take 1 tablet (10 mg total) by mouth daily.   chlorpheniramine-HYDROcodone (TUSSIONEX PENNKINETIC ER) 10-8 MG/5ML Take 5 mLs by mouth every 12 (twelve) hours as needed for cough.   FLUoxetine (PROZAC) 20 MG capsule Take 1 capsule (20 mg total) by mouth daily.   lisinopril-hydrochlorothiazide (ZESTORETIC) 20-25 MG tablet Take 1 tablet by mouth every morning.   metFORMIN (GLUCOPHAGE-XR) 500 MG 24 hr tablet Take 1 tablet (500 mg total) by mouth daily with breakfast.   omeprazole (PRILOSEC) 20 MG capsule Take 20 mg by mouth daily.   tirzepatide [facility]) 5 MG/0.5ML Pen Inject 5 mg into the skin once a week.   triamcinolone cream (KENALOG) 0.1 % Apply 1 application. topically 2 (two) times daily. To affected area until resolved.   Vitamin D, Ergocalciferol, (DRISDOL) 1.25 MG (50000 UNIT) CAPS capsule Take 1 capsule (50,000 Units total) by mouth every 7 (seven) days.   Zoster Vaccine Adjuvanted [facility]) injection Inject 0.5 mLs into the muscle once.   No facility-administered encounter medications on file as of 04/10/2022.    Surgical History: Past Surgical History:  Procedure Laterality Date   BREAST BIOPSY Right 2014   benign   COLONOSCOPY     COLONOSCOPY  WITH PROPOFOL N/A 10/01/2017   Procedure: COLONOSCOPY WITH PROPOFOL;  Surgeon: Virgel Manifold, MD;  Location: [facility] ENDOSCOPY;  Service: Endoscopy;  Laterality: N/A;   ESOPHAGOGASTRODUODENOSCOPY N/A 02/26/2017   Procedure: ESOPHAGOGASTRODUODENOSCOPY (EGD);  Surgeon: Virgel Manifold, MD;  Location: [facility] ENDOSCOPY;  Service: Endoscopy;  Laterality: N/A;   ESOPHAGOGASTRODUODENOSCOPY (EGD) WITH PROPOFOL N/A 10/01/2017   Procedure: ESOPHAGOGASTRODUODENOSCOPY (EGD) WITH PROPOFOL;  Surgeon: Virgel Manifold, MD;  Location: [facility] ENDOSCOPY;  Service: Endoscopy;  Laterality: N/A;   UPPER GASTROINTESTINAL ENDOSCOPY      Medical History: Past Medical History:  Diagnosis Date   Acid reflux    Acute gastric ulcer with hemorrhage    Acute posthemorrhagic anemia    Anxiety disorder    Benign hypertensive renal disease    Chronic peptic ulcer of stomach    Colon polyp    Controlled type 2 diabetes mellitus with renal manifestation (HCC)    Controlled type 2 diabetes mellitus with renal manifestation (HCC)    Frequent headaches    Heart murmur    Hypertension    Iron deficiency anemia    Lower GI bleed 02/25/2017   Pre-diabetes    Stomach irritation     Family History: Family History  Problem Relation Age of Onset   Hypertension Mother    Hyperlipidemia Mother    Glaucoma Mother    Depression Mother    Liver disease Father    Cancer Father    Glaucoma Sister    Glaucoma Sister    Vision loss Sister  Dementia Maternal Grandmother    Hypertension Brother    Hypertension Brother    Kidney disease Brother    Vision loss Brother    Breast cancer Neg Hx     Social History   Socioeconomic History   Marital status: Single    Spouse name: Not on file   Number of children: Not on file   Years of education: Not on file   Highest education level: Not on file  Occupational History   Not on file  Tobacco Use   Smoking status: Never   Smokeless tobacco: Never  Vaping Use    Vaping Use: Never used  Substance and Sexual Activity   Alcohol use: Not Currently    Alcohol/week: 3.0 standard drinks of alcohol    Types: 3 Cans of beer per week    Comment: on the weekends   Drug use: No   Sexual activity: Not Currently    Birth control/protection: Abstinence  Other Topics Concern   Not on file  Social History Narrative   Not on file   Social Determinants of Health   Financial Resource Strain: Not on file  Food Insecurity: Not on file  Transportation Needs: Not on file  Physical Activity: Not on file  Stress: Not on file  Social Connections: Not on file  Intimate Partner Violence: Not on file      Review of Systems  Constitutional:  Negative for chills, fatigue and unexpected weight change.  HENT:  Positive for postnasal drip. Negative for congestion, ear pain, rhinorrhea, sneezing and sore throat.   Eyes:  Negative for redness.  Respiratory:  Negative for cough, chest tightness, shortness of breath and wheezing.   Cardiovascular: Negative.  Negative for chest pain and palpitations.  Gastrointestinal: Negative.  Negative for abdominal pain, constipation, diarrhea, nausea and vomiting.  Genitourinary:  Negative for dysuria and frequency.  Musculoskeletal:  Negative for arthralgias, back pain, joint swelling and neck pain.  Skin:  Negative for rash.  Neurological: Negative.  Negative for tremors, numbness and headaches.  Hematological:  Negative for adenopathy. Does not bruise/bleed easily.  Psychiatric/Behavioral:  Negative for behavioral problems (Depression), self-injury, sleep disturbance and suicidal ideas. The patient is nervous/anxious (takes fluoxetine).     Vital Signs: BP 114/69   Pulse 88   Temp (!) 97.2 F (36.2 C)   Resp 16   Ht 5\' 5"  (1.651 m)   Wt 190 lb 3.2 oz (86.3 kg)   LMP 09/01/2014 (Approximate)   SpO2 98%   BMI 31.65 kg/m    Physical Exam Vitals reviewed.  Constitutional:      General: She is not in acute distress.     Appearance: Normal appearance. She is obese. She is not ill-appearing.  HENT:     Head: Normocephalic and atraumatic.  Eyes:     Pupils: Pupils are equal, round, and reactive to light.  Cardiovascular:     Rate and Rhythm: Normal rate and regular rhythm.  Pulmonary:     Effort: Pulmonary effort is normal. No respiratory distress.  Neurological:     Mental Status: She is alert and oriented to person, place, and time.  Psychiatric:        Mood and Affect: Mood normal.        Behavior: Behavior normal.        Assessment/Plan: 1. Type 2 diabetes mellitus with hyperglycemia, without long-term current use of insulin (HCC) Finished ozempic sample, getting ready to start mounjaro 5 mg weekly. Follow up in a  coupl of months to repeat A1c.  2. Benign hypertensive renal disease Stable, continue medications as prescribed.   3. Atypical mole of neck Referred to dermatology  - Ambulatory referral to Dermatology   General Counseling: maeve debord understanding of the findings of todays visit and agrees with plan of treatment. I have discussed any further diagnostic evaluation that may be needed or ordered today. We also reviewed her medications today. she has been encouraged to call the office with any questions or concerns that should arise related to todays visit.    Orders Placed This Encounter  Procedures   Ambulatory referral to Dermatology    No orders of the defined types were placed in this encounter.   Return for previously scheduled, CPE, Ellin Fitzgibbons PCP, Recheck A1C in late may.   Total time spent:30 Minutes Time spent includes review of chart, medications, test results, and follow up plan with the patient.   [place] Controlled Substance Database was reviewed by me.  This patient was seen by Jonetta Osgood, FNP-C in collaboration with Dr. Clayborn Bigness as a part of collaborative care agreement.   Anjenette Gerbino R. Valetta Fuller, MSN, FNP-C Internal medicine

## 2022-04-13 ENCOUNTER — Telehealth: Payer: Self-pay | Admitting: Nurse Practitioner

## 2022-04-13 NOTE — Telephone Encounter (Signed)
Awaiting 04/10/22 office notes for Dermatology referral-Tabitha Evans

## 2022-04-25 ENCOUNTER — Encounter: Payer: Self-pay | Admitting: Nurse Practitioner

## 2022-04-27 ENCOUNTER — Telehealth: Payer: Self-pay | Admitting: Nurse Practitioner

## 2022-04-27 NOTE — Telephone Encounter (Signed)
Dermatology referral faxed to [facility] due to insurance ; [phone]

## 2022-07-02 ENCOUNTER — Other Ambulatory Visit: Payer: Self-pay | Admitting: Nurse Practitioner

## 2022-07-03 LAB — COMPREHENSIVE METABOLIC PANEL
ALT: 19 IU/L (ref 0–32)
AST: 17 IU/L (ref 0–40)
Albumin/Globulin Ratio: 1.6 (ref 1.2–2.2)
Albumin: 4.6 g/dL (ref 3.8–4.9)
Alkaline Phosphatase: 114 IU/L (ref 44–121)
BUN/Creatinine Ratio: 20 (ref 9–23)
BUN: 14 mg/dL (ref 6–24)
Bilirubin Total: 0.3 mg/dL (ref 0.0–1.2)
CO2: 25 mmol/L (ref 20–29)
Calcium: 10.2 mg/dL (ref 8.7–10.2)
Chloride: 98 mmol/L (ref 96–106)
Creatinine, Ser: 0.71 mg/dL (ref 0.57–1.00)
Globulin, Total: 2.9 g/dL (ref 1.5–4.5)
Glucose: 96 mg/dL (ref 70–99)
Potassium: 4.9 mmol/L (ref 3.5–5.2)
Sodium: 137 mmol/L (ref 134–144)
Total Protein: 7.5 g/dL (ref 6.0–8.5)
eGFR: 98 mL/min/{1.73_m2} (ref >59)

## 2022-07-03 LAB — HGB A1C W/O EAG: Hgb A1c MFr Bld: 6.1 % — ABNORMAL HIGH (ref 4.8–5.6)

## 2022-07-03 LAB — LIPID PANEL
Chol/HDL Ratio: 3.3 ratio (ref 0.0–4.4)
Cholesterol, Total: 161 mg/dL (ref 100–199)
HDL: 49 mg/dL (ref >39)
LDL Chol Calc (NIH): 90 mg/dL (ref 0–99)
Triglycerides: 122 mg/dL (ref 0–149)
VLDL Cholesterol Cal: 22 mg/dL (ref 5–40)

## 2022-07-03 LAB — IRON AND TIBC
Iron Saturation: 25 % (ref 15–55)
Iron: 75 ug/dL (ref 27–159)
Total Iron Binding Capacity: 304 ug/dL (ref 250–450)
UIBC: 229 ug/dL (ref 131–425)

## 2022-07-03 LAB — VITAMIN D 25 HYDROXY (VIT D DEFICIENCY, FRACTURES): Vit D, 25-Hydroxy: 28.2 ng/mL — ABNORMAL LOW (ref 30.0–100.0)

## 2022-07-03 LAB — FERRITIN: Ferritin: 147 ng/mL (ref 15–150)

## 2022-07-09 ENCOUNTER — Ambulatory Visit (INDEPENDENT_AMBULATORY_CARE_PROVIDER_SITE_OTHER): Payer: PRIVATE HEALTH INSURANCE | Admitting: Nurse Practitioner

## 2022-07-09 ENCOUNTER — Encounter: Payer: Self-pay | Admitting: Nurse Practitioner

## 2022-07-09 VITALS — BP 110/80 | HR 73 | Temp 98.3°F | Resp 16 | Ht 65.0 in | Wt 186.4 lb

## 2022-07-09 DIAGNOSIS — E1165 Type 2 diabetes mellitus with hyperglycemia: Secondary | ICD-10-CM

## 2022-07-09 DIAGNOSIS — E559 Vitamin D deficiency, unspecified: Secondary | ICD-10-CM | POA: Diagnosis not present

## 2022-07-09 DIAGNOSIS — I129 Hypertensive chronic kidney disease with stage 1 through stage 4 chronic kidney disease, or unspecified chronic kidney disease: Secondary | ICD-10-CM

## 2022-07-09 DIAGNOSIS — Z6832 Body mass index (BMI) 32.0-32.9, adult: Secondary | ICD-10-CM

## 2022-07-09 DIAGNOSIS — R300 Dysuria: Secondary | ICD-10-CM

## 2022-07-09 DIAGNOSIS — Z0001 Encounter for general adult medical examination with abnormal findings: Secondary | ICD-10-CM

## 2022-07-09 DIAGNOSIS — Z23 Encounter for immunization: Secondary | ICD-10-CM

## 2022-07-09 DIAGNOSIS — E6609 Other obesity due to excess calories: Secondary | ICD-10-CM

## 2022-07-09 MED ORDER — SEMAGLUTIDE 3 MG PO TABS
3.0000 mg | ORAL_TABLET | Freq: Every day | ORAL | 2 refills | Status: DC
Start: 2022-07-09 — End: 2022-11-27

## 2022-07-09 MED ORDER — VITAMIN D (ERGOCALCIFEROL) 1.25 MG (50000 UNIT) PO CAPS
50000.0000 [IU] | ORAL_CAPSULE | ORAL | 0 refills | Status: AC
Start: 2022-07-09 — End: ?

## 2022-07-09 MED ORDER — ZOSTER VAC RECOMB ADJUVANTED 50 MCG/0.5ML IM SUSR
0.5000 mL | Freq: Once | INTRAMUSCULAR | 0 refills | Status: AC
Start: 2022-07-09 — End: 2022-07-09

## 2022-07-09 MED ORDER — TETANUS-DIPHTH-ACELL PERTUSSIS 5-2.5-18.5 LF-MCG/0.5 IM SUSP
0.5000 mL | Freq: Once | INTRAMUSCULAR | 0 refills | Status: AC
Start: 2022-07-09 — End: 2022-07-09

## 2022-07-09 NOTE — Progress Notes (Signed)
[facility] [address] [place], [place] [address]  Internal MEDICINE  Office Visit Note  Patient Name: Tabitha Evans  [id number]  [id number]  Date of Service: 07/09/2022  Chief Complaint  Patient presents with   Diabetes   Gastroesophageal Reflux   Hypertension   Annual Exam    HPI Tabitha Evans presents for an annual well visit and physical exam.  Well-appearing 62 y.o. female with diabetes, hypertensive renal disease, anxiety, peptic ulcer disease and history of IDA.  Routine CRC screening: due in 2029  Routine mammogram: done in January 2024 Pap smear: will do at next visit  Eye exam: will call to schedule  foot exam: done today New or worsening pain: none  Mounjaro too expensive, need alternative.  Vitamin D is still low but improved from last year Iron and ferritin are normal Cholesterol levels are normal. LDL is 90 as of 07/02/22, taking atorvastatin 10 mg daily.  A1c has improved significantly to 6.1   Current Medication: Outpatient Encounter Medications as of 07/09/2022  Medication Sig   atorvastatin (LIPITOR) 10 MG tablet Take 1 tablet (10 mg total) by mouth daily.   FLUoxetine (PROZAC) 20 MG capsule Take 1 capsule (20 mg total) by mouth daily.   lisinopril-hydrochlorothiazide (ZESTORETIC) 20-25 MG tablet Take 1 tablet by mouth every morning.   metFORMIN (GLUCOPHAGE-XR) 500 MG 24 hr tablet Take 1 tablet (500 mg total) by mouth daily with breakfast.   omeprazole (PRILOSEC) 20 MG capsule Take 20 mg by mouth daily.   Semaglutide 3 MG TABS Take 1 tablet (3 mg total) by mouth daily before breakfast. On an empty stomach with 4 oz of water and wait 30 minutes before eating drinking or taking other medications   triamcinolone cream (KENALOG) 0.1 % Apply 1 application. topically 2 (two) times daily. To affected area until resolved.   [DISCONTINUED] chlorpheniramine-HYDROcodone (TUSSIONEX PENNKINETIC ER) 10-8 MG/5ML Take 5 mLs by mouth every 12 (twelve) hours as  needed for cough.   [DISCONTINUED] Tdap (BOOSTRIX) 5-2.5-18.5 LF-MCG/0.5 injection Inject 0.5 mLs into the muscle once.   [DISCONTINUED] tirzepatide [facility]) 5 MG/0.5ML Pen Inject 5 mg into the skin once a week.   [DISCONTINUED] Vitamin D, Ergocalciferol, (DRISDOL) 1.25 MG (50000 UNIT) CAPS capsule Take 1 capsule (50,000 Units total) by mouth every 7 (seven) days.   [DISCONTINUED] Zoster Vaccine Adjuvanted [facility]) injection Inject 0.5 mLs into the muscle once.   Tdap (BOOSTRIX) 5-2.5-18.5 LF-MCG/0.5 injection Inject 0.5 mLs into the muscle once for 1 dose.   Vitamin D, Ergocalciferol, (DRISDOL) 1.25 MG (50000 UNIT) CAPS capsule Take 1 capsule (50,000 Units total) by mouth every 7 (seven) days.   Zoster Vaccine Adjuvanted [facility]) injection Inject 0.5 mLs into the muscle once for 1 dose.   No facility-administered encounter medications on file as of 07/09/2022.    Surgical History: Past Surgical History:  Procedure Laterality Date   BREAST BIOPSY Right 2014   benign   COLONOSCOPY     COLONOSCOPY WITH PROPOFOL N/A 10/01/2017   Procedure: COLONOSCOPY WITH PROPOFOL;  Surgeon: Pasty Spillers, MD;  Location: [facility] ENDOSCOPY;  Service: Endoscopy;  Laterality: N/A;   ESOPHAGOGASTRODUODENOSCOPY N/A 02/26/2017   Procedure: ESOPHAGOGASTRODUODENOSCOPY (EGD);  Surgeon: Pasty Spillers, MD;  Location: [facility] ENDOSCOPY;  Service: Endoscopy;  Laterality: N/A;   ESOPHAGOGASTRODUODENOSCOPY (EGD) WITH PROPOFOL N/A 10/01/2017   Procedure: ESOPHAGOGASTRODUODENOSCOPY (EGD) WITH PROPOFOL;  Surgeon: Pasty Spillers, MD;  Location: [facility] ENDOSCOPY;  Service: Endoscopy;  Laterality: N/A;   UPPER GASTROINTESTINAL ENDOSCOPY  Medical History: Past Medical History:  Diagnosis Date   Acid reflux    Acute gastric ulcer with hemorrhage    Acute posthemorrhagic anemia    Anxiety disorder    Benign hypertensive renal disease    Chronic peptic ulcer of stomach    Colon polyp    Controlled type 2  diabetes mellitus with renal manifestation (HCC)    Controlled type 2 diabetes mellitus with renal manifestation (HCC)    Frequent headaches    Heart murmur    Hypertension    Iron deficiency anemia    Lower GI bleed 02/25/2017   Pre-diabetes    Stomach irritation     Family History: Family History  Problem Relation Age of Onset   Hypertension Mother    Hyperlipidemia Mother    Glaucoma Mother    Depression Mother    Liver disease Father    Cancer Father    Glaucoma Sister    Glaucoma Sister    Vision loss Sister    Dementia Maternal Grandmother    Hypertension Brother    Hypertension Brother    Kidney disease Brother    Vision loss Brother    Breast cancer Neg Hx     Social History   Socioeconomic History   Marital status: Single    Spouse name: Not on file   Number of children: Not on file   Years of education: Not on file   Highest education level: Not on file  Occupational History   Not on file  Tobacco Use   Smoking status: Never   Smokeless tobacco: Never  Vaping Use   Vaping Use: Never used  Substance and Sexual Activity   Alcohol use: Not Currently    Alcohol/week: 3.0 standard drinks of alcohol    Types: 3 Cans of beer per week    Comment: on the weekends   Drug use: No   Sexual activity: Not Currently    Birth control/protection: Abstinence  Other Topics Concern   Not on file  Social History Narrative   Not on file   Social Determinants of Health   Financial Resource Strain: Not on file  Food Insecurity: Not on file  Transportation Needs: Not on file  Physical Activity: Not on file  Stress: Not on file  Social Connections: Not on file  Intimate Partner Violence: Not on file      Review of Systems  Constitutional:  Negative for activity change, appetite change, chills, fatigue, fever and unexpected weight change.  HENT: Negative.  Negative for congestion, ear pain, rhinorrhea, sore throat and trouble swallowing.   Eyes: Negative.    Respiratory:  Negative for cough, chest tightness, shortness of breath and wheezing.   Cardiovascular: Negative.  Negative for chest pain.  Gastrointestinal: Negative.  Negative for abdominal pain, blood in stool, constipation, diarrhea, nausea and vomiting.  Endocrine: Negative.   Genitourinary: Negative.  Negative for difficulty urinating, dysuria, frequency, hematuria and urgency.  Musculoskeletal: Negative.  Negative for arthralgias, back pain, joint swelling, myalgias and neck pain.  Skin: Negative.  Negative for rash and wound.  Allergic/Immunologic: Negative.  Negative for immunocompromised state.  Neurological: Negative.  Negative for dizziness, seizures, numbness and headaches.  Hematological: Negative.   Psychiatric/Behavioral: Negative.  Negative for behavioral problems, self-injury and suicidal ideas. The patient is not nervous/anxious.     Vital Signs: BP 110/80   Pulse 73   Temp 98.3 F (36.8 C)   Resp 16   Ht 5\' 5"  (1.651 m)  Wt 186 lb 6.4 oz (84.6 kg)   LMP 09/01/2014 (Approximate)   SpO2 99%   BMI 31.02 kg/m    Physical Exam Vitals reviewed.  Constitutional:      General: She is not in acute distress.    Appearance: Normal appearance. She is well-developed. She is obese. She is not ill-appearing or diaphoretic.  HENT:     Head: Normocephalic and atraumatic.     Right Ear: Tympanic membrane, ear canal and external ear normal.     Left Ear: Tympanic membrane and external ear normal.     Nose: Nose normal. No congestion or rhinorrhea.     Mouth/Throat:     Mouth: Mucous membranes are moist.     Pharynx: Oropharynx is clear. No oropharyngeal exudate or posterior oropharyngeal erythema.  Eyes:     General: No scleral icterus.       Right eye: No discharge.        Left eye: No discharge.     Extraocular Movements: Extraocular movements intact.     Conjunctiva/sclera: Conjunctivae normal.     Pupils: Pupils are equal, round, and reactive to light.  Neck:      Thyroid: No thyromegaly.     Vascular: No JVD.     Trachea: No tracheal deviation.  Cardiovascular:     Rate and Rhythm: Normal rate and regular rhythm.     Pulses:          Dorsalis pedis pulses are 2+ on the right side and 2+ on the left side.       Posterior tibial pulses are 2+ on the right side and 2+ on the left side.     Heart sounds: Normal heart sounds. No murmur heard.    No friction rub. No gallop.  Pulmonary:     Effort: Pulmonary effort is normal. No respiratory distress.     Breath sounds: Normal breath sounds. No stridor. No wheezing or rales.  Chest:     Chest wall: No tenderness.  Abdominal:     General: Bowel sounds are normal. There is no distension.     Palpations: Abdomen is soft. There is no mass.     Tenderness: There is no abdominal tenderness. There is no guarding or rebound.  Musculoskeletal:        General: No tenderness or deformity. Normal range of motion.     Cervical back: Normal range of motion and neck supple.     Right foot: Normal range of motion. No deformity, bunion, Charcot foot, foot drop or prominent metatarsal heads.     Left foot: Normal range of motion. No deformity, bunion, Charcot foot, foot drop or prominent metatarsal heads.  Feet:     Right foot:     Protective Sensation: 6 sites tested.  6 sites sensed.     Skin integrity: Skin integrity normal.     Toenail Condition: Right toenails are normal.     Left foot:     Protective Sensation: 6 sites tested.  6 sites sensed.     Skin integrity: Skin integrity normal.     Toenail Condition: Left toenails are normal.  Lymphadenopathy:     Cervical: No cervical adenopathy.  Skin:    General: Skin is warm and dry.     Capillary Refill: Capillary refill takes less than 2 seconds.     Coloration: Skin is not pale.     Findings: No erythema or rash.  Neurological:     Mental Status: She is  alert and oriented to person, place, and time.     Cranial Nerves: No cranial nerve deficit.      Motor: No abnormal muscle tone.     Coordination: Coordination normal.     Deep Tendon Reflexes: Reflexes are normal and symmetric.  Psychiatric:        Mood and Affect: Mood normal.        Behavior: Behavior normal.        Thought Content: Thought content normal.        Judgment: Judgment normal.        Assessment/Plan: 1. Encounter for routine adult health examination with abnormal findings Age-appropriate preventive screenings and vaccinations discussed, annual physical exam completed. Routine labs for health maintenance results reviewed with patient today. PHM updated.   2. Type 2 diabetes mellitus with hyperglycemia, without long-term current use of insulin (HCC) Discontinue mounjaro -- too expensive and backordered.  Start rybelsus 3 mg daily, 1 month sample given to patient. Follow up in 4 months for repeat A1c.  Continue taking atorvastatin 10 mg daily, current LDL is 90.  - Semaglutide 3 MG TABS; Take 1 tablet (3 mg total) by mouth daily before breakfast. On an empty stomach with 4 oz of water and wait 30 minutes before eating drinking or taking other medications  Dispense: 30 tablet; Refill: 2  3. Benign hypertensive renal disease Continue lisinopril-HCTZ for blood pressure control. Her labs showed normal kidney function at thius time. Will continue to monitor levels at least annually   4. Vitamin D deficiency Continue weekly prescription vitamin D supplement for 3 months then stop, will repeat lab then.  - Vitamin D, Ergocalciferol, (DRISDOL) 1.25 MG (50000 UNIT) CAPS capsule; Take 1 capsule (50,000 Units total) by mouth every 7 (seven) days.  Dispense: 12 capsule; Refill: 0  5. Class 1 obesity due to excess calories with serious comorbidity and body mass index (BMI) of 32.0 to 32.9 in adult Has lost 4 lbs since march. Will continue to watch diet and take her medications. GLP1 may continue to aid in weight loss.   6. Need for vaccination - Tdap (BOOSTRIX) 5-2.5-18.5  LF-MCG/0.5 injection; Inject 0.5 mLs into the muscle once for 1 dose.  Dispense: 0.5 mL; Refill: 0 - Zoster Vaccine Adjuvanted [facility]) injection; Inject 0.5 mLs into the muscle once for 1 dose.  Dispense: 0.5 mL; Refill: 0      General Counseling: Mckaila verbalizes understanding of the findings of todays visit and agrees with plan of treatment. I have discussed any further diagnostic evaluation that may be needed or ordered today. We also reviewed her medications today. she has been encouraged to call the office with any questions or concerns that should arise related to todays visit.    No orders of the defined types were placed in this encounter.   Meds ordered this encounter  Medications   Tdap (BOOSTRIX) 5-2.5-18.5 LF-MCG/0.5 injection    Sig: Inject 0.5 mLs into the muscle once for 1 dose.    Dispense:  0.5 mL    Refill:  0   Zoster Vaccine Adjuvanted [facility]) injection    Sig: Inject 0.5 mLs into the muscle once for 1 dose.    Dispense:  0.5 mL    Refill:  0   Semaglutide 3 MG TABS    Sig: Take 1 tablet (3 mg total) by mouth daily before breakfast. On an empty stomach with 4 oz of water and wait 30 minutes before eating drinking or taking other medications  Dispense:  30 tablet    Refill:  2    Discontinue mounjaro, fill new script for rybelsus instead   Vitamin D, Ergocalciferol, (DRISDOL) 1.25 MG (50000 UNIT) CAPS capsule    Sig: Take 1 capsule (50,000 Units total) by mouth every 7 (seven) days.    Dispense:  12 capsule    Refill:  0    Take for 3 months then stop    Return in about 4 months (around 11/09/2022) for F/U PAP smear and A1C check , Aelyn Stanaland PCP.   Total time spent:30 Minutes Time spent includes review of chart, medications, test results, and follow up plan with the patient.   [place] Controlled Substance Database was reviewed by me.  This patient was seen by Sallyanne Kuster, FNP-C in collaboration with Dr. Beverely Risen as a part of collaborative care  agreement.  Tarea Skillman R. Tedd Sias, MSN, FNP-C Internal medicine

## 2022-07-17 ENCOUNTER — Other Ambulatory Visit: Payer: Self-pay | Admitting: Nurse Practitioner

## 2022-07-17 DIAGNOSIS — E782 Mixed hyperlipidemia: Secondary | ICD-10-CM

## 2022-08-17 ENCOUNTER — Other Ambulatory Visit: Payer: Self-pay | Admitting: Nurse Practitioner

## 2022-08-17 DIAGNOSIS — Z7689 Persons encountering health services in other specified circumstances: Secondary | ICD-10-CM

## 2022-09-01 ENCOUNTER — Telehealth: Payer: Self-pay

## 2022-09-01 NOTE — Telephone Encounter (Addendum)
Completed P.A. for patient's Rybelsus 7mg .

## 2022-09-03 ENCOUNTER — Encounter: Payer: Self-pay | Admitting: Nurse Practitioner

## 2022-09-10 ENCOUNTER — Telehealth: Payer: Self-pay

## 2022-09-10 ENCOUNTER — Encounter: Payer: Self-pay | Admitting: Nurse Practitioner

## 2022-10-02 NOTE — Telephone Encounter (Signed)
Done

## 2022-10-13 ENCOUNTER — Other Ambulatory Visit: Payer: Self-pay | Admitting: Nurse Practitioner

## 2022-10-13 DIAGNOSIS — E782 Mixed hyperlipidemia: Secondary | ICD-10-CM

## 2022-11-13 ENCOUNTER — Ambulatory Visit: Payer: PRIVATE HEALTH INSURANCE | Admitting: Nurse Practitioner

## 2022-11-27 ENCOUNTER — Ambulatory Visit: Payer: PRIVATE HEALTH INSURANCE | Admitting: Nurse Practitioner

## 2022-11-27 ENCOUNTER — Encounter: Payer: Self-pay | Admitting: Nurse Practitioner

## 2022-11-27 VITALS — BP 130/86 | HR 73 | Temp 98.2°F | Resp 16 | Ht 65.0 in | Wt 192.2 lb

## 2022-11-27 DIAGNOSIS — Z23 Encounter for immunization: Secondary | ICD-10-CM | POA: Diagnosis not present

## 2022-11-27 DIAGNOSIS — Z01419 Encounter for gynecological examination (general) (routine) without abnormal findings: Secondary | ICD-10-CM | POA: Diagnosis not present

## 2022-11-27 DIAGNOSIS — E1165 Type 2 diabetes mellitus with hyperglycemia: Secondary | ICD-10-CM | POA: Diagnosis not present

## 2022-11-27 LAB — POCT GLYCOSYLATED HEMOGLOBIN (HGB A1C): Hemoglobin A1C: 7.8 % — AB (ref 4.0–5.6)

## 2022-11-27 MED ORDER — ZOSTER VAC RECOMB ADJUVANTED 50 MCG/0.5ML IM SUSR
0.5000 mL | Freq: Once | INTRAMUSCULAR | 0 refills | Status: AC
Start: 2022-11-27 — End: 2022-11-27

## 2022-11-27 MED ORDER — TETANUS-DIPHTH-ACELL PERTUSSIS 5-2.5-18.5 LF-MCG/0.5 IM SUSP
0.5000 mL | Freq: Once | INTRAMUSCULAR | 0 refills | Status: AC
Start: 2022-11-27 — End: 2022-11-27

## 2022-11-27 MED ORDER — TIRZEPATIDE 5 MG/0.5ML ~~LOC~~ SOAJ: 5.0000 mg | SUBCUTANEOUS | 1 refills | Status: DC

## 2022-11-27 NOTE — Progress Notes (Signed)
[facility] [address] [place], [place] [address]  Internal MEDICINE  Office Visit Note  Patient Name: Tabitha Evans  [id number]  [id number]  Date of Service: 11/27/2022  Chief Complaint  Patient presents with   Diabetes   Hypertension   Gastroesophageal Reflux   Follow-up    HPI Tabitha Evans presents for a follow-up visit for pap smear, diabetes and vaccines.  Pap smear -- due now, no issues. Pelvic exam done today.  Diabetes  -- significant increase in A1c to 7.8. has been taking rybelsus 3 mg daily but rybelsus was denied approval by insurance. Insurance should cover mounjaro. She is also on metformin XR.  Need vaccinations -- requesting flu vaccine today. Also due for tdap and shingles vaccines.     Current Medication: Outpatient Encounter Medications as of 11/27/2022  Medication Sig   atorvastatin (LIPITOR) 10 MG tablet Take 1 tablet by mouth once daily   FLUoxetine (PROZAC) 20 MG capsule Take 1 capsule by mouth once daily   lisinopril-hydrochlorothiazide (ZESTORETIC) 20-25 MG tablet Take 1 tablet by mouth every morning.   metFORMIN (GLUCOPHAGE-XR) 500 MG 24 hr tablet Take 1 tablet (500 mg total) by mouth daily with breakfast.   omeprazole (PRILOSEC) 20 MG capsule Take 20 mg by mouth daily.   tirzepatide [facility]) 5 MG/0.5ML Pen Inject 5 mg into the skin once a week.   triamcinolone cream (KENALOG) 0.1 % Apply 1 application. topically 2 (two) times daily. To affected area until resolved.   Vitamin D, Ergocalciferol, (DRISDOL) 1.25 MG (50000 UNIT) CAPS capsule Take 1 capsule (50,000 Units total) by mouth every 7 (seven) days.   [DISCONTINUED] Semaglutide 3 MG TABS Take 1 tablet (3 mg total) by mouth daily before breakfast. On an empty stomach with 4 oz of water and wait 30 minutes before eating drinking or taking other medications   [DISCONTINUED] Tdap (BOOSTRIX) 5-2.5-18.5 LF-MCG/0.5 injection Inject 0.5 mLs into the muscle once.   [DISCONTINUED] Zoster  Vaccine Adjuvanted [facility]) injection Inject 0.5 mLs into the muscle once.   Tdap (BOOSTRIX) 5-2.5-18.5 LF-MCG/0.5 injection Inject 0.5 mLs into the muscle once for 1 dose.   Zoster Vaccine Adjuvanted [facility]) injection Inject 0.5 mLs into the muscle once for 1 dose.   No facility-administered encounter medications on file as of 11/27/2022.    Surgical History: Past Surgical History:  Procedure Laterality Date   BREAST BIOPSY Right 2014   benign   COLONOSCOPY     COLONOSCOPY WITH PROPOFOL N/A 10/01/2017   Procedure: COLONOSCOPY WITH PROPOFOL;  Surgeon: Pasty Spillers, MD;  Location: [facility] ENDOSCOPY;  Service: Endoscopy;  Laterality: N/A;   ESOPHAGOGASTRODUODENOSCOPY N/A 02/26/2017   Procedure: ESOPHAGOGASTRODUODENOSCOPY (EGD);  Surgeon: Pasty Spillers, MD;  Location[facility] ENDOSCOPY;  Service: Endoscopy;  Laterality: N/A;   ESOPHAGOGASTRODUODENOSCOPY (EGD) WITH PROPOFOL N/A 10/01/2017   Procedure: ESOPHAGOGASTRODUODENOSCOPY (EGD) WITH PROPOFOL;  Surgeon: Pasty Spillers, MD;  Location: [facility] ENDOSCOPY;  Service: Endoscopy;  Laterality: N/A;   UPPER GASTROINTESTINAL ENDOSCOPY      Medical History: Past Medical History:  Diagnosis Date   Acid reflux    Acute gastric ulcer with hemorrhage    Acute posthemorrhagic anemia    Anxiety disorder    Benign hypertensive renal disease    Chronic peptic ulcer of stomach    Colon polyp    Controlled type 2 diabetes mellitus with renal manifestation (HCC)    Controlled type 2 diabetes mellitus with renal manifestation (HCC)    Frequent headaches    Heart murmur  Hypertension    Iron deficiency anemia    Lower GI bleed 02/25/2017   Pre-diabetes    Stomach irritation     Family History: Family History  Problem Relation Age of Onset   Hypertension Mother    Hyperlipidemia Mother    Glaucoma Mother    Depression Mother    Liver disease Father    Cancer Father    Glaucoma Sister    Glaucoma Sister    Vision loss  Sister    Dementia Maternal Grandmother    Hypertension Brother    Hypertension Brother    Kidney disease Brother    Vision loss Brother    Breast cancer Neg Hx     Social History   Socioeconomic History   Marital status: Single    Spouse name: Not on file   Number of children: Not on file   Years of education: Not on file   Highest education level: Not on file  Occupational History   Not on file  Tobacco Use   Smoking status: Never   Smokeless tobacco: Never  Vaping Use   Vaping status: Never Used  Substance and Sexual Activity   Alcohol use: Not Currently    Alcohol/week: 3.0 standard drinks of alcohol    Types: 3 Cans of beer per week    Comment: on the weekends   Drug use: No   Sexual activity: Not Currently    Birth control/protection: Abstinence  Other Topics Concern   Not on file  Social History Narrative   Not on file   Social Determinants of Health   Financial Resource Strain: Low Risk  (03/27/2021)   Received from [facility], [facility]   Overall Financial Resource Strain (CARDIA)    Difficulty of Paying Living Expenses: Not hard at all  Food Insecurity: No Food Insecurity (03/27/2021)   Received from [facility], [facility]   Hunger Vital Sign    Worried About Running Out of Food in the Last Year: Never true    Ran Out of Food in the Last Year: Never true  Transportation Needs: No Transportation Needs (03/27/2021)   Received from [facility], [facility]   PRAPARE - Transportation    Lack of Transportation (Medical): No    Lack of Transportation (Non-Medical): No  Physical Activity: Not on file  Stress: Not on file  Social Connections: Not on file  Intimate Partner Violence: Not on file      Review of Systems  Constitutional:  Negative for chills, fatigue and unexpected weight change.  HENT:  Positive for postnasal drip. Negative for congestion, ear pain, rhinorrhea, sneezing and sore throat.   Eyes:  Negative for  redness.  Respiratory:  Negative for cough, chest tightness, shortness of breath and wheezing.   Cardiovascular: Negative.  Negative for chest pain and palpitations.  Gastrointestinal: Negative.  Negative for abdominal pain, constipation, diarrhea, nausea and vomiting.  Endocrine: Positive for polydipsia and polyuria.  Genitourinary:  Negative for dysuria and frequency.  Musculoskeletal:  Negative for arthralgias, back pain, joint swelling and neck pain.  Skin:  Negative for rash.  Neurological: Negative.  Negative for tremors, numbness and headaches.  Hematological:  Negative for adenopathy. Does not bruise/bleed easily.  Psychiatric/Behavioral:  Negative for behavioral problems (Depression), self-injury, sleep disturbance and suicidal ideas. The patient is nervous/anxious (takes fluoxetine).     Vital Signs: BP 130/86   Pulse 73   Temp 98.2 F (36.8 C)   Resp  16   Ht 5\' 5"  (1.651 m)   Wt 192 lb 3.2 oz (87.2 kg)   LMP 09/01/2014 (Approximate)   SpO2 98%   BMI 31.98 kg/m    Physical Exam Vitals reviewed.  Constitutional:      General: She is not in acute distress.    Appearance: Normal appearance. She is obese. She is not ill-appearing.  HENT:     Head: Normocephalic and atraumatic.  Eyes:     Pupils: Pupils are equal, round, and reactive to light.  Cardiovascular:     Rate and Rhythm: Normal rate and regular rhythm.  Pulmonary:     Effort: Pulmonary effort is normal. No respiratory distress.  Neurological:     Mental Status: She is alert and oriented to person, place, and time.  Psychiatric:        Mood and Affect: Mood normal.        Behavior: Behavior normal.        Assessment/Plan: 1. Type 2 diabetes mellitus with hyperglycemia, without long-term current use of insulin (HCC) A1c is elevated. Mounjaro prescribed. Will complete PA if required and see how this medication helps control her sugars and sugar cravings.  - POCT glycosylated hemoglobin (Hb A1C)  2.  Encounter for routine gynecological examination with Papanicolaou smear of cervix Normal pelvic exam done with speculum, and pap smear done.  - IGP, Aptima HPV  3. Need for vaccination Flu vaccine administered in office today.  Tdap and shingles vaccine orders sent to pharmacy.  - Influenza, MDCK, trivalent, PF(Flucelvax egg-free) - Tdap (BOOSTRIX) 5-2.5-18.5 LF-MCG/0.5 injection; Inject 0.5 mLs into the muscle once for 1 dose.  Dispense: 0.5 mL; Refill: 0 - Zoster Vaccine Adjuvanted [facility]) injection; Inject 0.5 mLs into the muscle once for 1 dose.  Dispense: 0.5 mL; Refill: 0   General Counseling: Twisha verbalizes understanding of the findings of todays visit and agrees with plan of treatment. I have discussed any further diagnostic evaluation that may be needed or ordered today. We also reviewed her medications today. she has been encouraged to call the office with any questions or concerns that should arise related to todays visit.    Orders Placed This Encounter  Procedures   Influenza, MDCK, trivalent, PF(Flucelvax egg-free)   POCT glycosylated hemoglobin (Hb A1C)    Meds ordered this encounter  Medications   Tdap (BOOSTRIX) 5-2.5-18.5 LF-MCG/0.5 injection    Sig: Inject 0.5 mLs into the muscle once for 1 dose.    Dispense:  0.5 mL    Refill:  0   Zoster Vaccine Adjuvanted [facility]) injection    Sig: Inject 0.5 mLs into the muscle once for 1 dose.    Dispense:  0.5 mL    Refill:  0   tirzepatide (MOUNJARO) 5 MG/0.5ML Pen    Sig: Inject 5 mg into the skin once a week.    Dispense:  6 mL    Refill:  1    Discontinue rybelsus, and start new script asap, please send any required prior auth to fax # [phone]    Return in about 4 months (around 03/30/2023) for F/U, Recheck A1C, Shandy Checo PCP.   Total time spent:30 Minutes Time spent includes review of chart, medications, test results, and follow up plan with the patient.   [place] Controlled Substance Database was  reviewed by me.  This patient was seen by Sallyanne Kuster, FNP-C in collaboration with Dr. Beverely Risen as a part of collaborative care agreement.   Cristin Szatkowski R. Tedd Sias, MSN, FNP-C  Internal medicine

## 2022-12-02 LAB — IGP, APTIMA HPV: HPV Aptima: POSITIVE — AB

## 2022-12-06 ENCOUNTER — Encounter: Payer: Self-pay | Admitting: Nurse Practitioner

## 2022-12-07 ENCOUNTER — Other Ambulatory Visit: Payer: Self-pay

## 2022-12-07 DIAGNOSIS — I129 Hypertensive chronic kidney disease with stage 1 through stage 4 chronic kidney disease, or unspecified chronic kidney disease: Secondary | ICD-10-CM

## 2022-12-07 MED ORDER — TIRZEPATIDE 5 MG/0.5ML ~~LOC~~ SOAJ
5.0000 mg | SUBCUTANEOUS | 1 refills | Status: DC
  Filled 2022-12-07 – 2022-12-12 (×4): qty 2, 28d supply, fill #0
  Filled 2023-01-06 (×2): qty 2, 28d supply, fill #1

## 2022-12-07 MED ORDER — LISINOPRIL-HYDROCHLOROTHIAZIDE 20-25 MG PO TABS: 1.0000 | ORAL_TABLET | Freq: Every morning | ORAL | 1 refills | Status: DC

## 2022-12-08 ENCOUNTER — Other Ambulatory Visit: Payer: Self-pay

## 2022-12-10 ENCOUNTER — Other Ambulatory Visit: Payer: Self-pay

## 2022-12-13 ENCOUNTER — Other Ambulatory Visit: Payer: Self-pay

## 2022-12-14 ENCOUNTER — Other Ambulatory Visit: Payer: Self-pay

## 2022-12-15 ENCOUNTER — Other Ambulatory Visit: Payer: Self-pay

## 2022-12-16 ENCOUNTER — Other Ambulatory Visit: Payer: Self-pay

## 2022-12-18 ENCOUNTER — Telehealth: Payer: Self-pay

## 2022-12-18 NOTE — Telephone Encounter (Signed)
-----   Message from [facility] sent at 12/18/2022  5:14 AM EST ----- Normal pap but positive for HPV, repeat pap with HPV cotesting in 1 year.

## 2022-12-18 NOTE — Telephone Encounter (Signed)
Patient notified

## 2022-12-18 NOTE — Progress Notes (Signed)
Normal pap but positive for HPV, repeat pap with HPV cotesting in 1 year.

## 2023-01-06 ENCOUNTER — Other Ambulatory Visit: Payer: Self-pay

## 2023-01-10 ENCOUNTER — Other Ambulatory Visit: Payer: Self-pay

## 2023-01-10 ENCOUNTER — Other Ambulatory Visit: Payer: Self-pay | Admitting: Nurse Practitioner

## 2023-01-10 DIAGNOSIS — E782 Mixed hyperlipidemia: Secondary | ICD-10-CM

## 2023-01-10 MED ORDER — ATORVASTATIN CALCIUM 10 MG PO TABS
10.0000 mg | ORAL_TABLET | Freq: Every day | ORAL | 0 refills | Status: DC
  Filled 2023-01-10: qty 90, 90d supply, fill #0

## 2023-01-16 ENCOUNTER — Encounter: Payer: Self-pay | Admitting: Nurse Practitioner

## 2023-01-20 ENCOUNTER — Other Ambulatory Visit: Payer: Self-pay

## 2023-01-27 ENCOUNTER — Other Ambulatory Visit: Payer: Self-pay

## 2023-01-27 ENCOUNTER — Other Ambulatory Visit: Payer: Self-pay | Admitting: Nurse Practitioner

## 2023-01-27 MED ORDER — MOUNJARO 5 MG/0.5ML ~~LOC~~ SOAJ
5.0000 mg | SUBCUTANEOUS | 1 refills | Status: DC
  Filled 2023-01-27 – 2023-02-02 (×2): qty 2, 28d supply, fill #0
  Filled 2023-03-03: qty 2, 28d supply, fill #1

## 2023-02-02 ENCOUNTER — Other Ambulatory Visit: Payer: Self-pay

## 2023-02-09 ENCOUNTER — Other Ambulatory Visit: Payer: Self-pay | Admitting: Nurse Practitioner

## 2023-02-09 DIAGNOSIS — Z7689 Persons encountering health services in other specified circumstances: Secondary | ICD-10-CM

## 2023-02-09 DIAGNOSIS — E1165 Type 2 diabetes mellitus with hyperglycemia: Secondary | ICD-10-CM

## 2023-03-03 ENCOUNTER — Other Ambulatory Visit: Payer: Self-pay

## 2023-03-05 ENCOUNTER — Other Ambulatory Visit: Payer: Self-pay

## 2023-03-26 ENCOUNTER — Encounter: Payer: Self-pay | Admitting: Nurse Practitioner

## 2023-03-26 ENCOUNTER — Ambulatory Visit (INDEPENDENT_AMBULATORY_CARE_PROVIDER_SITE_OTHER): Payer: PRIVATE HEALTH INSURANCE | Admitting: Nurse Practitioner

## 2023-03-26 ENCOUNTER — Other Ambulatory Visit: Payer: Self-pay

## 2023-03-26 VITALS — BP 130/86 | HR 71 | Temp 97.8°F | Resp 16 | Ht 65.0 in | Wt 187.0 lb

## 2023-03-26 DIAGNOSIS — E1169 Type 2 diabetes mellitus with other specified complication: Secondary | ICD-10-CM | POA: Diagnosis not present

## 2023-03-26 DIAGNOSIS — I129 Hypertensive chronic kidney disease with stage 1 through stage 4 chronic kidney disease, or unspecified chronic kidney disease: Secondary | ICD-10-CM | POA: Diagnosis not present

## 2023-03-26 DIAGNOSIS — Z6831 Body mass index (BMI) 31.0-31.9, adult: Secondary | ICD-10-CM

## 2023-03-26 DIAGNOSIS — E119 Type 2 diabetes mellitus without complications: Secondary | ICD-10-CM | POA: Insufficient documentation

## 2023-03-26 DIAGNOSIS — F411 Generalized anxiety disorder: Secondary | ICD-10-CM

## 2023-03-26 DIAGNOSIS — E785 Hyperlipidemia, unspecified: Secondary | ICD-10-CM

## 2023-03-26 DIAGNOSIS — E6609 Other obesity due to excess calories: Secondary | ICD-10-CM

## 2023-03-26 DIAGNOSIS — E66811 Obesity, class 1: Secondary | ICD-10-CM | POA: Diagnosis not present

## 2023-03-26 HISTORY — DX: Type 2 diabetes mellitus with other specified complication: E11.69

## 2023-03-26 LAB — POCT GLYCOSYLATED HEMOGLOBIN (HGB A1C): Hemoglobin A1C: 6.1 % — AB (ref 4.0–5.6)

## 2023-03-26 MED ORDER — METFORMIN HCL ER 500 MG PO TB24
500.0000 mg | ORAL_TABLET | Freq: Every day | ORAL | 0 refills | Status: DC
  Filled 2023-03-26: qty 90, 90d supply, fill #0

## 2023-03-26 MED ORDER — LISINOPRIL-HYDROCHLOROTHIAZIDE 20-25 MG PO TABS
1.0000 | ORAL_TABLET | Freq: Every morning | ORAL | 1 refills | Status: DC
  Filled 2023-03-26: qty 90, 90d supply, fill #0

## 2023-03-26 MED ORDER — TIRZEPATIDE 7.5 MG/0.5ML ~~LOC~~ SOAJ
7.5000 mg | SUBCUTANEOUS | 1 refills | Status: DC
  Filled 2023-03-26: qty 2, 28d supply, fill #0

## 2023-03-26 MED ORDER — FLUOXETINE HCL 20 MG PO CAPS
20.0000 mg | ORAL_CAPSULE | Freq: Every day | ORAL | 0 refills | Status: DC
  Filled 2023-03-26: qty 90, 90d supply, fill #0

## 2023-03-26 MED ORDER — ATORVASTATIN CALCIUM 10 MG PO TABS
10.0000 mg | ORAL_TABLET | Freq: Every day | ORAL | 0 refills | Status: DC
  Filled 2023-03-26: qty 90, 90d supply, fill #0

## 2023-03-26 NOTE — Progress Notes (Signed)
[facility] [address] [place], [place] [address]  Internal MEDICINE  Office Visit Note  Patient Name: Tabitha Evans  [id number]  [id number]  Date of Service: 03/26/2023  Chief Complaint  Patient presents with   Diabetes   Gastroesophageal Reflux   Hypertension   Follow-up    HPI Tabitha Evans presents for a follow-up visit for diabetes, obesity, hypertension, refills.  Diabetes -- A1c improved to 6.1 today.  Reviewed pap smear, will repeat in October this year.  Interested in bariatric surgery Due for some refills of medications     Current Medication: Outpatient Encounter Medications as of 03/26/2023  Medication Sig   omeprazole (PRILOSEC) 20 MG capsule Take 20 mg by mouth daily.   tirzepatide (MOUNJARO) 7.5 MG/0.5ML Pen Inject 7.5 mg into the skin once a week.   triamcinolone cream (KENALOG) 0.1 % Apply 1 application. topically 2 (two) times daily. To affected area until resolved.   Vitamin D, Ergocalciferol, (DRISDOL) 1.25 MG (50000 UNIT) CAPS capsule Take 1 capsule (50,000 Units total) by mouth every 7 (seven) days.   [DISCONTINUED] atorvastatin (LIPITOR) 10 MG tablet Take 1 tablet (10 mg total) by mouth daily.   [DISCONTINUED] FLUoxetine (PROZAC) 20 MG capsule Take 1 capsule by mouth once daily   [DISCONTINUED] lisinopril-hydrochlorothiazide (ZESTORETIC) 20-25 MG tablet Take 1 tablet by mouth every morning.   [DISCONTINUED] metFORMIN (GLUCOPHAGE-XR) 500 MG 24 hr tablet Take 1 tablet by mouth once daily with breakfast   [DISCONTINUED] tirzepatide (MOUNJARO) 5 MG/0.5ML Pen Inject 5 mg into the skin once a week.   atorvastatin (LIPITOR) 10 MG tablet Take 1 tablet (10 mg total) by mouth daily.   FLUoxetine (PROZAC) 20 MG capsule Take 1 capsule (20 mg total) by mouth daily.   lisinopril-hydrochlorothiazide (ZESTORETIC) 20-25 MG tablet Take 1 tablet by mouth every morning.   metFORMIN (GLUCOPHAGE-XR) 500 MG 24 hr tablet Take 1 tablet (500 mg total) by mouth daily  with breakfast.   No facility-administered encounter medications on file as of 03/26/2023.    Surgical History: Past Surgical History:  Procedure Laterality Date   BREAST BIOPSY Right 2014   benign   COLONOSCOPY     COLONOSCOPY WITH PROPOFOL N/A 10/01/2017   Procedure: COLONOSCOPY WITH PROPOFOL;  Surgeon: Pasty Spillers, MD;  Location: [facility] ENDOSCOPY;  Service: Endoscopy;  Laterality: N/A;   ESOPHAGOGASTRODUODENOSCOPY N/A 02/26/2017   Procedure: ESOPHAGOGASTRODUODENOSCOPY (EGD);  Surgeon: Pasty Spillers, MD;  Location: [facility] ENDOSCOPY;  Service: Endoscopy;  Laterality: N/A;   ESOPHAGOGASTRODUODENOSCOPY (EGD) WITH PROPOFOL N/A 10/01/2017   Procedure: ESOPHAGOGASTRODUODENOSCOPY (EGD) WITH PROPOFOL;  Surgeon: Pasty Spillers, MD;  Location: [facility] ENDOSCOPY;  Service: Endoscopy;  Laterality: N/A;   UPPER GASTROINTESTINAL ENDOSCOPY      Medical History: Past Medical History:  Diagnosis Date   Acid reflux    Acute gastric ulcer with hemorrhage    Acute posthemorrhagic anemia    Anxiety disorder    Benign hypertensive renal disease    Chronic peptic ulcer of stomach    Colon polyp    Controlled type 2 diabetes mellitus with renal manifestation (HCC)    Controlled type 2 diabetes mellitus with renal manifestation (HCC)    Frequent headaches    Heart murmur    Hyperlipidemia associated with type 2 diabetes mellitus (HCC) 03/26/2023   Hypertension    Iron deficiency anemia    Lower GI bleed 02/25/2017   Pre-diabetes    Stomach irritation     Family History: Family History  Problem Relation Age  of Onset   Hypertension Mother    Hyperlipidemia Mother    Glaucoma Mother    Depression Mother    Liver disease Father    Cancer Father    Glaucoma Sister    Glaucoma Sister    Vision loss Sister    Dementia Maternal Grandmother    Hypertension Brother    Hypertension Brother    Kidney disease Brother    Vision loss Brother    Breast cancer Neg Hx     Social History    Socioeconomic History   Marital status: Single    Spouse name: Not on file   Number of children: Not on file   Years of education: Not on file   Highest education level: Not on file  Occupational History   Not on file  Tobacco Use   Smoking status: Never   Smokeless tobacco: Never  Vaping Use   Vaping status: Never Used  Substance and Sexual Activity   Alcohol use: Not Currently    Alcohol/week: 3.0 standard drinks of alcohol    Types: 3 Cans of beer per week    Comment: on the weekends   Drug use: No   Sexual activity: Not Currently    Birth control/protection: Abstinence  Other Topics Concern   Not on file  Social History Narrative   Not on file   Social Drivers of Health   Financial Resource Strain: Low Risk  (03/27/2021)   Received from [facility], [facility]   Overall Financial Resource Strain (CARDIA)    Difficulty of Paying Living Expenses: Not hard at all  Food Insecurity: No Food Insecurity (03/27/2021)   Received from [facility], [facility]   Hunger Vital Sign    Worried About Running Out of Food in the Last Year: Never true    Ran Out of Food in the Last Year: Never true  Transportation Needs: No Transportation Needs (03/27/2021)   Received from [facility], [facility]   PRAPARE - Transportation    Lack of Transportation (Medical): No    Lack of Transportation (Non-Medical): No  Physical Activity: Not on file  Stress: Not on file  Social Connections: Not on file  Intimate Partner Violence: Not on file      Review of Systems  Constitutional:  Negative for chills, fatigue and unexpected weight change.  HENT:  Positive for postnasal drip. Negative for congestion, ear pain, rhinorrhea, sneezing and sore throat.   Eyes:  Negative for redness.  Respiratory:  Negative for cough, chest tightness, shortness of breath and wheezing.   Cardiovascular: Negative.  Negative for chest pain and palpitations.  Gastrointestinal: Negative.   Negative for abdominal pain, constipation, diarrhea, nausea and vomiting.  Genitourinary:  Negative for dysuria and frequency.  Musculoskeletal:  Negative for arthralgias, back pain, joint swelling and neck pain.  Skin:  Negative for rash.  Neurological: Negative.  Negative for tremors, numbness and headaches.  Hematological:  Negative for adenopathy. Does not bruise/bleed easily.  Psychiatric/Behavioral:  Negative for behavioral problems (Depression), self-injury, sleep disturbance and suicidal ideas. The patient is nervous/anxious (takes fluoxetine).     Vital Signs: BP 130/86   Pulse 71   Temp 97.8 F (36.6 C)   Resp 16   Ht 5\' 5"  (1.651 m)   Wt 187 lb (84.8 kg)   LMP 09/01/2014 (Approximate)   SpO2 99%   BMI 31.12 kg/m    Physical Exam Vitals reviewed.  Constitutional:  General: She is not in acute distress.    Appearance: Normal appearance. She is obese. She is not ill-appearing.  HENT:     Head: Normocephalic and atraumatic.  Eyes:     Pupils: Pupils are equal, round, and reactive to light.  Cardiovascular:     Rate and Rhythm: Normal rate and regular rhythm.  Pulmonary:     Effort: Pulmonary effort is normal. No respiratory distress.  Neurological:     Mental Status: She is alert and oriented to person, place, and time.  Psychiatric:        Mood and Affect: Mood normal.        Behavior: Behavior normal.        Assessment/Plan: 1. Type 2 diabetes mellitus with other specified complication, without long-term current use of insulin (HCC) (Primary) A1c significantly improved from prior, at 6.1 today. Mounjaro dose increased to 7.5 mg weekly. Continue metformin as prescribed.  - POCT glycosylated hemoglobin (Hb A1C) - tirzepatide (MOUNJARO) 7.5 MG/0.5ML Pen; Inject 7.5 mg into the skin once a week.  Dispense: 6 mL; Refill: 1 - metFORMIN (GLUCOPHAGE-XR) 500 MG 24 hr tablet; Take 1 tablet (500 mg total) by mouth daily with breakfast.  Dispense: 90 tablet;  Refill: 0  2. Benign hypertensive renal disease Stable, Continue lisinopril-hydrochlorothiazide as prescribed.  - lisinopril-hydrochlorothiazide (ZESTORETIC) 20-25 MG tablet; Take 1 tablet by mouth every morning.  Dispense: 90 tablet; Refill: 1  3. Hyperlipidemia associated with type 2 diabetes mellitus (HCC) Continue atorvastatin as prescribed.  - atorvastatin (LIPITOR) 10 MG tablet; Take 1 tablet (10 mg total) by mouth daily.  Dispense: 90 tablet; Refill: 0  4. Class 1 obesity due to excess calories with serious comorbidity and body mass index (BMI) of 31.0 to 31.9 in adult Referred for bariatric surgery, interested in the gastric sleeve - Amb Referral to Bariatric Surgery  5. Generalized anxiety disorder Continue fluoxetine as prescribed  - FLUoxetine (PROZAC) 20 MG capsule; Take 1 capsule (20 mg total) by mouth daily.  Dispense: 90 capsule; Refill: 0   General Counseling: Tabitha Evans verbalizes understanding of the findings of todays visit and agrees with plan of treatment. I have discussed any further diagnostic evaluation that may be needed or ordered today. We also reviewed her medications today. she has been encouraged to call the office with any questions or concerns that should arise related to todays visit.    Orders Placed This Encounter  Procedures   Amb Referral to Bariatric Surgery   POCT glycosylated hemoglobin (Hb A1C)    Meds ordered this encounter  Medications   tirzepatide (MOUNJARO) 7.5 MG/0.5ML Pen    Sig: Inject 7.5 mg into the skin once a week.    Dispense:  6 mL    Refill:  1    Fill new script today, discontinue 5 mg dose.   FLUoxetine (PROZAC) 20 MG capsule    Sig: Take 1 capsule (20 mg total) by mouth daily.    Dispense:  90 capsule    Refill:  0    For future refills   atorvastatin (LIPITOR) 10 MG tablet    Sig: Take 1 tablet (10 mg total) by mouth daily.    Dispense:  90 tablet    Refill:  0    For future refills   metFORMIN (GLUCOPHAGE-XR) 500  MG 24 hr tablet    Sig: Take 1 tablet (500 mg total) by mouth daily with breakfast.    Dispense:  90 tablet    Refill:  0  For future refills   lisinopril-hydrochlorothiazide (ZESTORETIC) 20-25 MG tablet    Sig: Take 1 tablet by mouth every morning.    Dispense:  90 tablet    Refill:  1    For future refills    Return in about 4 months (around 07/24/2023) for F/U, Recheck A1C, Tabitha Evans PCP.   Total time spent:30 Minutes Time spent includes review of chart, medications, test results, and follow up plan with the patient.   [place] Controlled Substance Database was reviewed by me.  This patient was seen by Sallyanne Kuster, FNP-C in collaboration with Dr. Beverely Risen as a part of collaborative care agreement.   Tabitha Reimers R. Tedd Sias, MSN, FNP-C Internal medicine

## 2023-03-27 LAB — MICROALBUMIN / CREATININE URINE RATIO
Creatinine, Urine: 200.5 mg/dL
Microalb/Creat Ratio: 6 mg/g{creat} (ref 0–29)
Microalbumin, Urine: 12.1 ug/mL

## 2023-03-29 ENCOUNTER — Encounter: Payer: Self-pay | Admitting: Nurse Practitioner

## 2023-03-29 ENCOUNTER — Other Ambulatory Visit: Payer: Self-pay

## 2023-03-31 ENCOUNTER — Telehealth: Payer: Self-pay | Admitting: Nurse Practitioner

## 2023-03-31 NOTE — Telephone Encounter (Signed)
 Bariatric Surgery referral faxed to [facility]; [phone]. Notified patient. Gave pt telephone [phone]

## 2023-04-07 ENCOUNTER — Other Ambulatory Visit: Payer: Self-pay

## 2023-04-16 ENCOUNTER — Telehealth: Payer: Self-pay | Admitting: Nurse Practitioner

## 2023-04-16 IMAGING — MG MM DIGITAL SCREENING BILAT W/ TOMO AND CAD
8 series · 8 of 24 positions shown · non-contrast
Comparison: Previous exam(s).

CLINICAL DATA: Screening.

EXAM:
DIGITAL SCREENING BILATERAL MAMMOGRAM WITH TOMOSYNTHESIS AND CAD
TECHNIQUE: Bilateral screening digital craniocaudal and mediolateral oblique
mammograms were obtained. Bilateral screening digital breast
tomosynthesis was performed. The images were evaluated with
computer-aided detection.

[R MLO synth-2D]
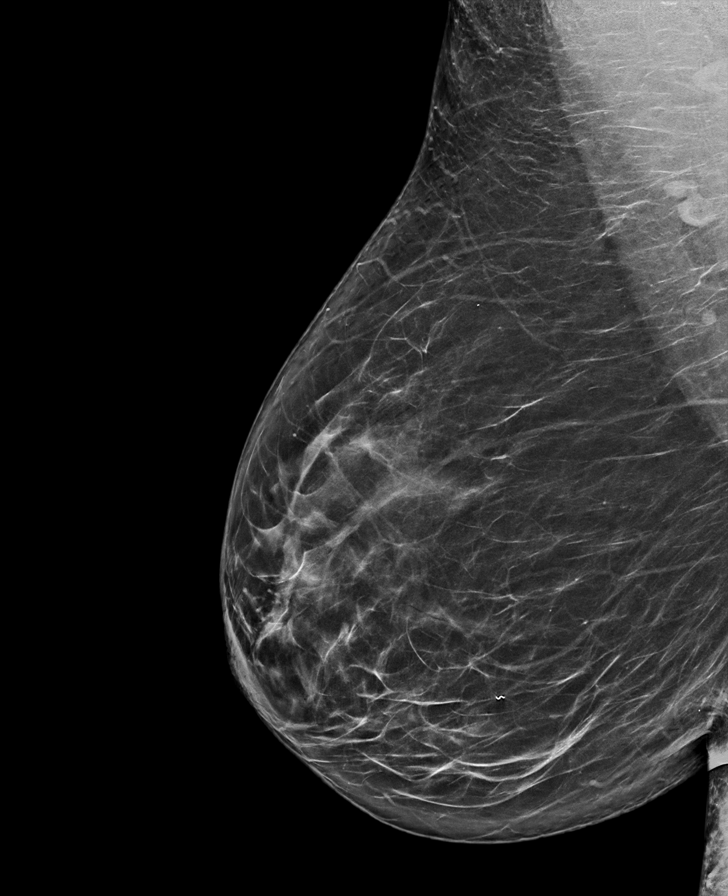

[R CC synth-2D]
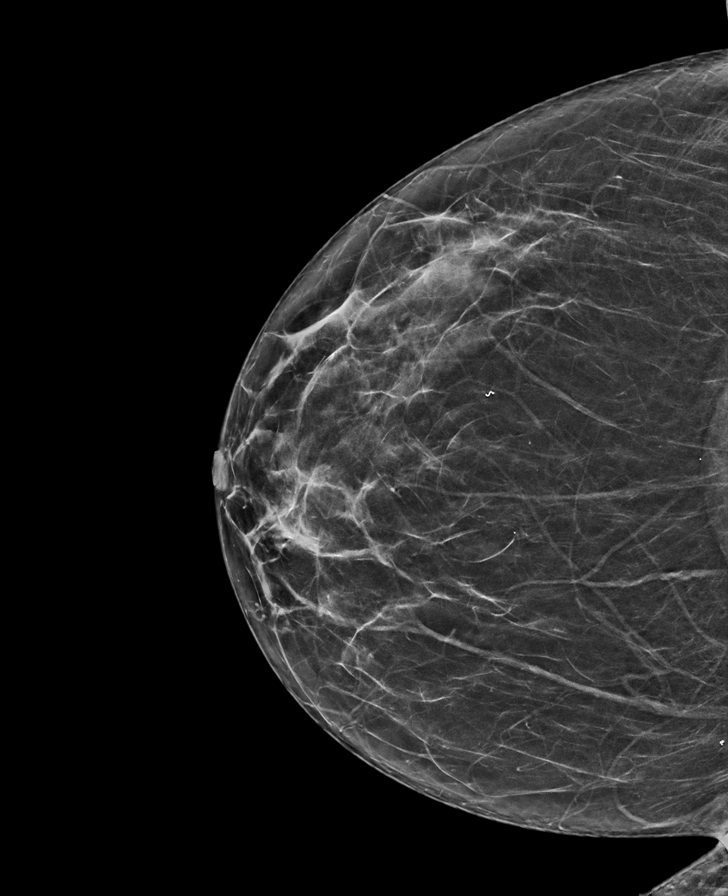

[L MLO synth-2D]
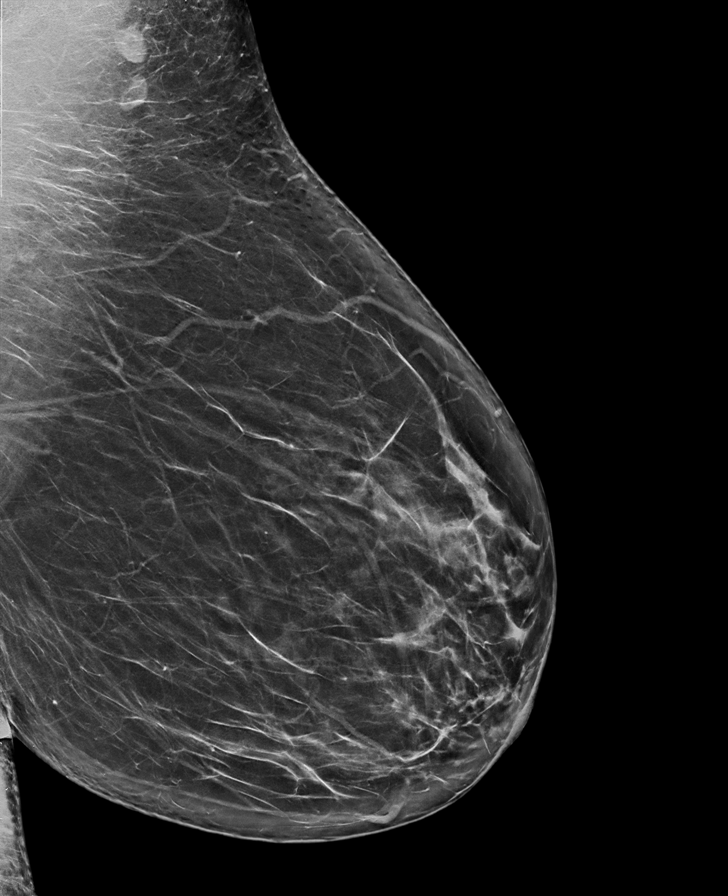

[L CC synth-2D]
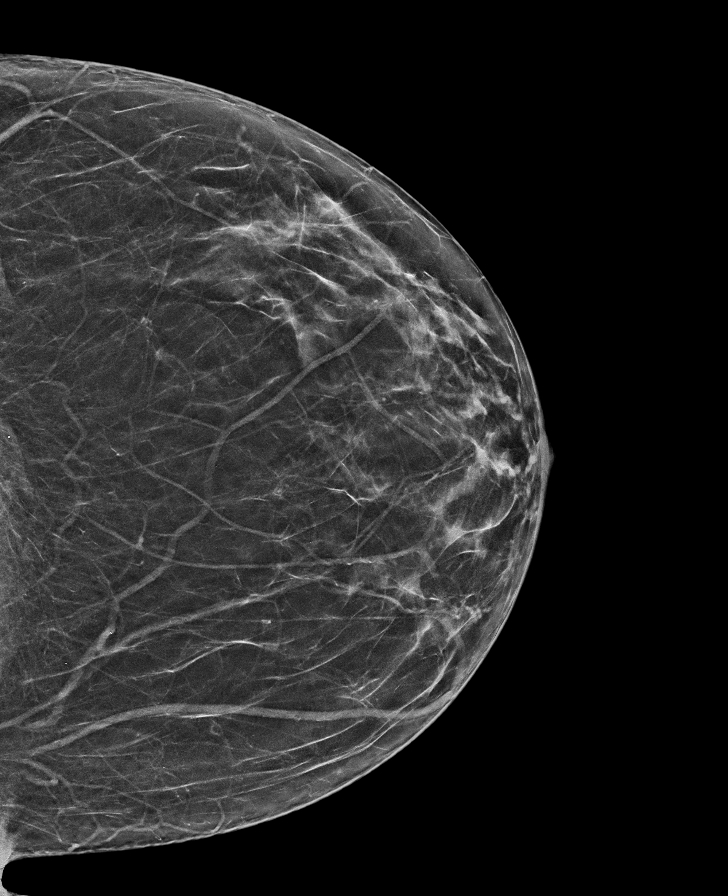

[R MLO tomo · tomo slice 39/78.0]
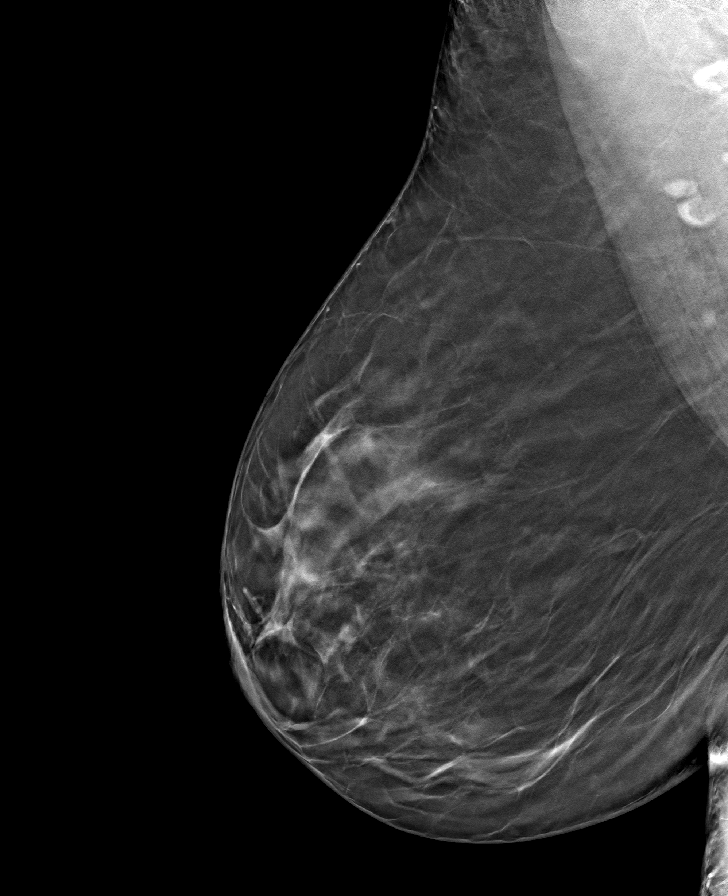

[R CC tomo · tomo slice 33/66.0]
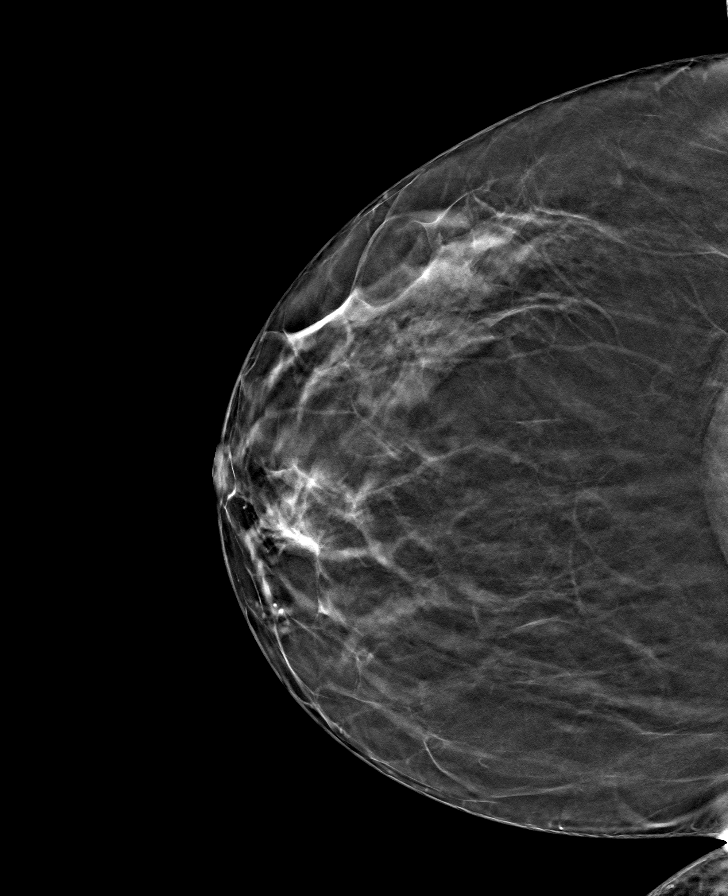

[L CC tomo · tomo slice 32/63.0]
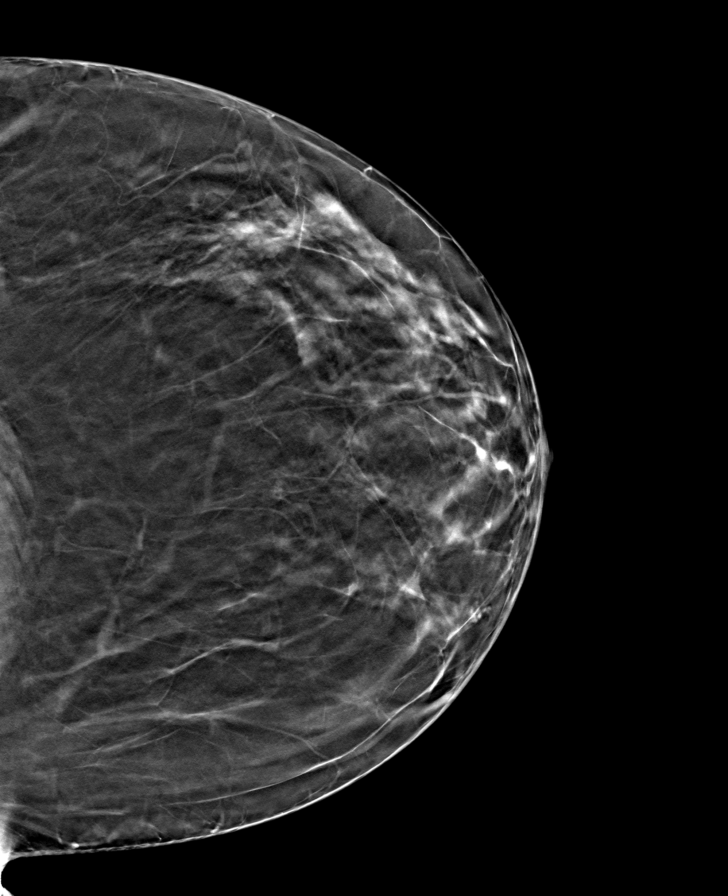

[L MLO tomo · tomo slice 43/84.0]
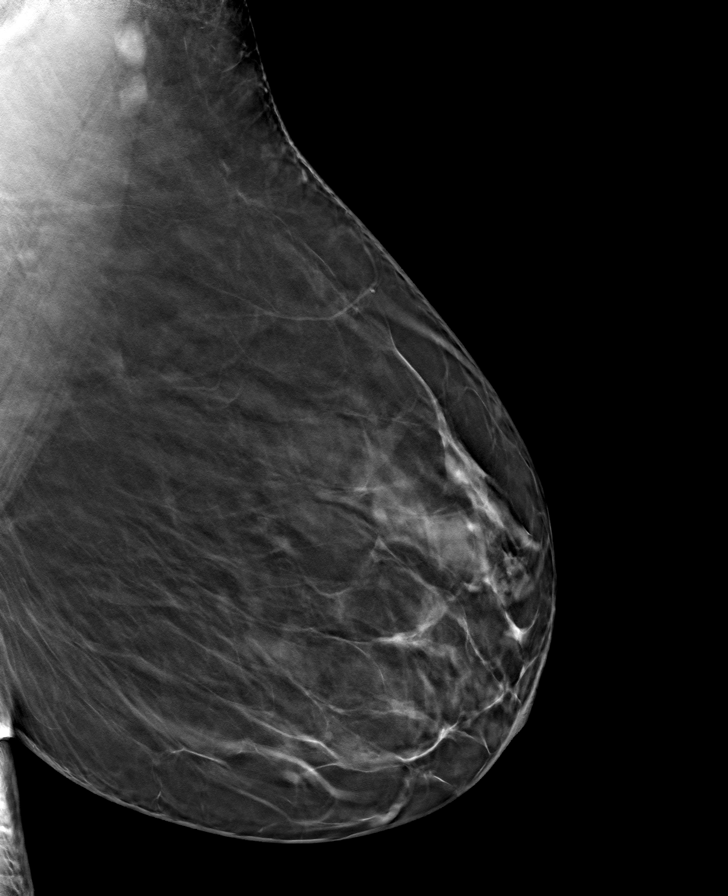

[8 of 24 positions shown; findings below may reference images not displayed]

ACR Breast Density Category b: There are scattered areas of
fibroglandular density.
FINDINGS: There are no findings suspicious for malignancy.
IMPRESSION: No mammographic evidence of malignancy. A result letter of this
screening mammogram will be mailed directly to the patient.

RECOMMENDATION:
Screening mammogram in one year. (Code:51-O-LD2)

BI-RADS CATEGORY  1: Negative.

## 2023-04-16 NOTE — Telephone Encounter (Signed)
 Per [facility], referral has been denied due to patient's BMI is too low. Must be over 35-[name]

## 2023-05-11 ENCOUNTER — Other Ambulatory Visit: Payer: Self-pay | Admitting: Nurse Practitioner

## 2023-05-11 DIAGNOSIS — E1169 Type 2 diabetes mellitus with other specified complication: Secondary | ICD-10-CM

## 2023-05-11 DIAGNOSIS — F411 Generalized anxiety disorder: Secondary | ICD-10-CM

## 2023-06-02 ENCOUNTER — Other Ambulatory Visit: Payer: Self-pay | Admitting: Nurse Practitioner

## 2023-06-02 DIAGNOSIS — I129 Hypertensive chronic kidney disease with stage 1 through stage 4 chronic kidney disease, or unspecified chronic kidney disease: Secondary | ICD-10-CM

## 2023-06-07 ENCOUNTER — Encounter: Payer: Self-pay | Admitting: Nurse Practitioner

## 2023-06-20 ENCOUNTER — Other Ambulatory Visit: Payer: Self-pay | Admitting: Nurse Practitioner

## 2023-06-20 DIAGNOSIS — E1169 Type 2 diabetes mellitus with other specified complication: Secondary | ICD-10-CM

## 2023-07-12 ENCOUNTER — Encounter: Payer: Self-pay | Admitting: Nurse Practitioner

## 2023-07-12 ENCOUNTER — Ambulatory Visit (INDEPENDENT_AMBULATORY_CARE_PROVIDER_SITE_OTHER): Payer: PRIVATE HEALTH INSURANCE | Admitting: Nurse Practitioner

## 2023-07-12 VITALS — BP 128/86 | HR 80 | Temp 98.1°F | Resp 16 | Ht 65.0 in | Wt 192.8 lb

## 2023-07-12 DIAGNOSIS — E1169 Type 2 diabetes mellitus with other specified complication: Secondary | ICD-10-CM

## 2023-07-12 DIAGNOSIS — I152 Hypertension secondary to endocrine disorders: Secondary | ICD-10-CM

## 2023-07-12 DIAGNOSIS — L282 Other prurigo: Secondary | ICD-10-CM | POA: Diagnosis not present

## 2023-07-12 DIAGNOSIS — E785 Hyperlipidemia, unspecified: Secondary | ICD-10-CM

## 2023-07-12 DIAGNOSIS — E1159 Type 2 diabetes mellitus with other circulatory complications: Secondary | ICD-10-CM

## 2023-07-12 DIAGNOSIS — Z1231 Encounter for screening mammogram for malignant neoplasm of breast: Secondary | ICD-10-CM | POA: Diagnosis not present

## 2023-07-12 DIAGNOSIS — Z0001 Encounter for general adult medical examination with abnormal findings: Secondary | ICD-10-CM | POA: Diagnosis not present

## 2023-07-12 DIAGNOSIS — F411 Generalized anxiety disorder: Secondary | ICD-10-CM

## 2023-07-12 DIAGNOSIS — E559 Vitamin D deficiency, unspecified: Secondary | ICD-10-CM | POA: Diagnosis not present

## 2023-07-12 LAB — POCT GLYCOSYLATED HEMOGLOBIN (HGB A1C): Hemoglobin A1C: 9.8 % — AB (ref 4.0–5.6)

## 2023-07-12 MED ORDER — ATORVASTATIN CALCIUM 10 MG PO TABS: 10.0000 mg | ORAL_TABLET | Freq: Every day | ORAL | 1 refills | Status: AC

## 2023-07-12 MED ORDER — MOUNJARO 5 MG/0.5ML ~~LOC~~ SOAJ
5.0000 mg | SUBCUTANEOUS | 5 refills | Status: DC
Start: 2023-07-12 — End: 2023-12-27

## 2023-07-12 MED ORDER — FLUOXETINE HCL 20 MG PO CAPS: 20.0000 mg | ORAL_CAPSULE | Freq: Every day | ORAL | 1 refills | Status: AC

## 2023-07-12 MED ORDER — LISINOPRIL-HYDROCHLOROTHIAZIDE 20-25 MG PO TABS: 1.0000 | ORAL_TABLET | Freq: Every morning | ORAL | 0 refills | Status: DC

## 2023-07-12 MED ORDER — METFORMIN HCL ER 500 MG PO TB24: 500.0000 mg | ORAL_TABLET | Freq: Every day | ORAL | 1 refills | Status: AC

## 2023-07-12 MED ORDER — TRIAMCINOLONE ACETONIDE 0.1 % EX CREA: 1.0000 | TOPICAL_CREAM | Freq: Two times a day (BID) | CUTANEOUS | 1 refills | Status: AC

## 2023-07-12 NOTE — Progress Notes (Signed)
 [facility] [address] [place], [place] [address]  Internal MEDICINE  Office Visit Note  Patient Name: Tabitha Evans  [id number]  [id number]  Date of Service: 07/12/2023  Chief Complaint  Patient presents with   Annual Exam   Diabetes   Gastroesophageal Reflux   Hyperlipidemia   Hypertension    HPI Tabitha Evans presents for an annual well visit and physical exam.  Well-appearing 63 y.o. female with diabetes, high cholesterol, GERD, hypertension, diverticulosis, anemia, and anxiety.  Routine CRC screening: due in 2029 Routine mammogram: due now  DEXA scan: due in 5 more years  Pap smear: repeat due in October  Eye exam: due for annual eye exam, patient will call her eye doctor.  foot exam: done today.  Labs: due for routine labs  New or worsening pain: none,  Other concerns: elevated A1c     Current Medication: Outpatient Encounter Medications as of 07/12/2023  Medication Sig   omeprazole  (PRILOSEC) 20 MG capsule Take 20 mg by mouth daily.   tirzepatide  (MOUNJARO ) 5 MG/0.5ML Pen Inject 5 mg into the skin once a week.   triamcinolone  cream (KENALOG ) 0.1 % Apply 1 Application topically 2 (two) times daily. To rash on left leg until resolved.   Vitamin D , Ergocalciferol , (DRISDOL ) 1.25 MG (50000 UNIT) CAPS capsule Take 1 capsule (50,000 Units total) by mouth every 7 (seven) days.   [DISCONTINUED] atorvastatin  (LIPITOR) 10 MG tablet Take 1 tablet by mouth once daily   [DISCONTINUED] FLUoxetine  (PROZAC ) 20 MG capsule Take 1 capsule by mouth once daily   [DISCONTINUED] lisinopril -hydrochlorothiazide  (ZESTORETIC ) 20-25 MG tablet TAKE 1 TABLET BY MOUTH ONCE DAILY IN THE MORNING   [DISCONTINUED] metFORMIN  (GLUCOPHAGE -XR) 500 MG 24 hr tablet Take 1 tablet by mouth once daily with breakfast   [DISCONTINUED] tirzepatide  (MOUNJARO ) 7.5 MG/0.5ML Pen Inject 7.5 mg into the skin once a week.   [DISCONTINUED] triamcinolone  cream (KENALOG ) 0.1 % Apply 1 application. topically 2  (two) times daily. To affected area until resolved.   atorvastatin  (LIPITOR) 10 MG tablet Take 1 tablet (10 mg total) by mouth daily.   FLUoxetine  (PROZAC ) 20 MG capsule Take 1 capsule (20 mg total) by mouth daily.   lisinopril -hydrochlorothiazide  (ZESTORETIC ) 20-25 MG tablet Take 1 tablet by mouth every morning.   metFORMIN  (GLUCOPHAGE -XR) 500 MG 24 hr tablet Take 1 tablet (500 mg total) by mouth daily with breakfast.   No facility-administered encounter medications on file as of 07/12/2023.    Surgical History: Past Surgical History:  Procedure Laterality Date   BREAST BIOPSY Right 2014   benign   COLONOSCOPY     COLONOSCOPY WITH PROPOFOL  N/A 10/01/2017   Procedure: COLONOSCOPY WITH PROPOFOL ;  Surgeon: Irby Mannan, MD;  Location: [facility] ENDOSCOPY;  Service: Endoscopy;  Laterality: N/A;   ESOPHAGOGASTRODUODENOSCOPY N/A 02/26/2017   Procedure: ESOPHAGOGASTRODUODENOSCOPY (EGD);  Surgeon: Irby Mannan, MD;  Location: [facility] ENDOSCOPY;  Service: Endoscopy;  Laterality: N/A;   ESOPHAGOGASTRODUODENOSCOPY (EGD) WITH PROPOFOL  N/A 10/01/2017   Procedure: ESOPHAGOGASTRODUODENOSCOPY (EGD) WITH PROPOFOL ;  Surgeon: Irby Mannan, MD;  Location: [facility] ENDOSCOPY;  Service: Endoscopy;  Laterality: N/A;   UPPER GASTROINTESTINAL ENDOSCOPY      Medical History: Past Medical History:  Diagnosis Date   Acid reflux    Acute gastric ulcer with hemorrhage    Acute posthemorrhagic anemia    Anxiety disorder    Benign hypertensive renal disease    Chronic peptic ulcer of stomach    Colon polyp    Controlled type 2 diabetes  mellitus with renal manifestation (HCC)    Controlled type 2 diabetes mellitus with renal manifestation (HCC)    Frequent headaches    Heart murmur    Hyperlipidemia associated with type 2 diabetes mellitus (HCC) 03/26/2023   Hypertension    Iron deficiency anemia    Lower GI bleed 02/25/2017   Pre-diabetes    Stomach irritation     Family History: Family History   Problem Relation Age of Onset   Hypertension Mother    Hyperlipidemia Mother    Glaucoma Mother    Depression Mother    Liver disease Father    Cancer Father    Glaucoma Sister    Glaucoma Sister    Vision loss Sister    Dementia Maternal Grandmother    Hypertension Brother    Hypertension Brother    Kidney disease Brother    Vision loss Brother    Breast cancer Neg Hx     Social History   Socioeconomic History   Marital status: Single    Spouse name: Not on file   Number of children: Not on file   Years of education: Not on file   Highest education level: Not on file  Occupational History   Not on file  Tobacco Use   Smoking status: Never   Smokeless tobacco: Never  Vaping Use   Vaping status: Never Used  Substance and Sexual Activity   Alcohol use: Not Currently    Alcohol/week: 3.0 standard drinks of alcohol    Types: 3 Cans of beer per week    Comment: on the weekends   Drug use: No   Sexual activity: Not Currently    Birth control/protection: Abstinence  Other Topics Concern   Not on file  Social History Narrative   Not on file   Social Drivers of Health   Financial Resource Strain: Low Risk  (03/27/2021)   Received from [facility], [facility]   Overall Financial Resource Strain (CARDIA)    Difficulty of Paying Living Expenses: Not hard at all  Food Insecurity: No Food Insecurity (03/27/2021)   Received from [facility], [facility]   Hunger Vital Sign    Worried About Running Out of Food in the Last Year: Never true    Ran Out of Food in the Last Year: Never true  Transportation Needs: No Transportation Needs (03/27/2021)   Received from [facility], [facility]   PRAPARE - Transportation    Lack of Transportation (Medical): No    Lack of Transportation (Non-Medical): No  Physical Activity: Not on file  Stress: Not on file  Social Connections: Not on file  Intimate Partner Violence: Not on file      Review of Systems   Constitutional:  Negative for activity change, appetite change, chills, fatigue, fever and unexpected weight change.  HENT: Negative.  Negative for congestion, ear pain, rhinorrhea, sore throat and trouble swallowing.   Eyes: Negative.   Respiratory:  Negative for cough, chest tightness, shortness of breath and wheezing.   Cardiovascular: Negative.  Negative for chest pain.  Gastrointestinal: Negative.  Negative for abdominal pain, blood in stool, constipation, diarrhea, nausea and vomiting.  Endocrine: Negative.   Genitourinary: Negative.  Negative for difficulty urinating, dysuria, frequency, hematuria and urgency.  Musculoskeletal: Negative.  Negative for arthralgias, back pain, joint swelling, myalgias and neck pain.  Skin: Negative.  Negative for rash and wound.  Allergic/Immunologic: Negative.  Negative for immunocompromised state.  Neurological: Negative.  Negative for dizziness, seizures, numbness and headaches.  Hematological: Negative.   Psychiatric/Behavioral: Negative.  Negative for behavioral problems, self-injury and suicidal ideas. The patient is not nervous/anxious.     Vital Signs: BP 128/86   Pulse 80   Temp 98.1 F (36.7 C)   Resp 16   Ht 5\' 5"  (1.651 m)   Wt 192 lb 12.8 oz (87.5 kg)   LMP 09/01/2014 (Approximate)   SpO2 98%   BMI 32.08 kg/m    Physical Exam Vitals reviewed.  Constitutional:      General: She is not in acute distress.    Appearance: Normal appearance. She is well-developed. She is obese. She is not ill-appearing or diaphoretic.  HENT:     Head: Normocephalic and atraumatic.     Right Ear: Tympanic membrane, ear canal and external ear normal.     Left Ear: Tympanic membrane and external ear normal.     Nose: Nose normal. No congestion or rhinorrhea.     Mouth/Throat:     Mouth: Mucous membranes are moist.     Pharynx: Oropharynx is clear. No oropharyngeal exudate or posterior oropharyngeal erythema.  Eyes:     General: No scleral  icterus.       Right eye: No discharge.        Left eye: No discharge.     Extraocular Movements: Extraocular movements intact.     Conjunctiva/sclera: Conjunctivae normal.     Pupils: Pupils are equal, round, and reactive to light.  Neck:     Thyroid: No thyromegaly.     Vascular: No JVD.     Trachea: No tracheal deviation.  Cardiovascular:     Rate and Rhythm: Normal rate and regular rhythm.     Pulses:          Dorsalis pedis pulses are 2+ on the right side and 2+ on the left side.       Posterior tibial pulses are 2+ on the right side and 2+ on the left side.     Heart sounds: Normal heart sounds. No murmur heard.    No friction rub. No gallop.  Pulmonary:     Effort: Pulmonary effort is normal. No respiratory distress.     Breath sounds: Normal breath sounds. No stridor. No wheezing or rales.  Chest:     Chest wall: No tenderness.  Abdominal:     General: Bowel sounds are normal. There is no distension.     Palpations: Abdomen is soft. There is no mass.     Tenderness: There is no abdominal tenderness. There is no guarding or rebound.  Musculoskeletal:        General: No tenderness or deformity. Normal range of motion.     Cervical back: Normal range of motion and neck supple.     Right foot: Normal range of motion. No deformity, bunion, Charcot foot, foot drop or prominent metatarsal heads.     Left foot: Normal range of motion. No deformity, bunion, Charcot foot, foot drop or prominent metatarsal heads.  Feet:     Right foot:     Protective Sensation: 6 sites tested.  6 sites sensed.     Skin integrity: Skin integrity normal.     Toenail Condition: Right toenails are normal.     Left foot:     Protective Sensation: 6 sites tested.  6 sites sensed.     Skin integrity: Skin integrity normal.     Toenail Condition: Left toenails are normal.  Lymphadenopathy:  Cervical: No cervical adenopathy.  Skin:    General: Skin is warm and dry.     Capillary Refill: Capillary  refill takes less than 2 seconds.     Coloration: Skin is not pale.     Findings: No erythema or rash.  Neurological:     Mental Status: She is alert and oriented to person, place, and time.     Cranial Nerves: No cranial nerve deficit.     Motor: No abnormal muscle tone.     Coordination: Coordination normal.     Deep Tendon Reflexes: Reflexes are normal and symmetric.  Psychiatric:        Mood and Affect: Mood normal.        Behavior: Behavior normal.        Thought Content: Thought content normal.        Judgment: Judgment normal.        Assessment/Plan: 1. Encounter for routine adult health examination with abnormal findings (Primary) Age-appropriate preventive screenings and vaccinations discussed, annual physical exam completed. Routine labs for health maintenance ordered, see below. PHM updated.   - CBC with Differential/Platelet - CMP14+EGFR - Lipid Profile - Vitamin D  (25 hydroxy)  2. Type 2 diabetes mellitus with other specified complication, without long-term current use of insulin (HCC) A1c is elevated but patient was having trouble getting her mounjaro  approved. Routine labs ordered. Continue medications as prescribed.  - POCT glycosylated hemoglobin (Hb A1C) - tirzepatide  (MOUNJARO ) 5 MG/0.5ML Pen; Inject 5 mg into the skin once a week.  Dispense: 2 mL; Refill: 5 - metFORMIN  (GLUCOPHAGE -XR) 500 MG 24 hr tablet; Take 1 tablet (500 mg total) by mouth daily with breakfast.  Dispense: 90 tablet; Refill: 1 - CBC with Differential/Platelet - CMP14+EGFR - Lipid Profile  3. Hypertension associated with diabetes (HCC) Continue lisinopril -hydrochlorothiazide  as prescribed. Routine labs ordered  - lisinopril -hydrochlorothiazide  (ZESTORETIC ) 20-25 MG tablet; Take 1 tablet by mouth every morning.  Dispense: 90 tablet; Refill: 0 - CBC with Differential/Platelet - CMP14+EGFR - Lipid Profile  4. Hyperlipidemia associated with type 2 diabetes mellitus (HCC) Continue  atorvastatin  as prescribed. Routine labs ordered - atorvastatin  (LIPITOR) 10 MG tablet; Take 1 tablet (10 mg total) by mouth daily.  Dispense: 90 tablet; Refill: 1 - CBC with Differential/Platelet - CMP14+EGFR - Lipid Profile  5. Vitamin D  deficiency Routine lab ordered - Vitamin D  (25 hydroxy)  6. Pruritic rash Topical triamcinolone  prescribed  - triamcinolone  cream (KENALOG ) 0.1 %; Apply 1 Application topically 2 (two) times daily. To rash on left leg until resolved.  Dispense: 30 g; Refill: 1  7. Encounter for screening mammogram for malignant neoplasm of breast Routine mammogram ordered  - MM 3D SCREENING MAMMOGRAM BILATERAL BREAST; Future  8. Generalized anxiety disorder Continue fluoxetine  as prescribed.  - FLUoxetine  (PROZAC ) 20 MG capsule; Take 1 capsule (20 mg total) by mouth daily.  Dispense: 90 capsule; Refill: 1      General Counseling: Tabitha Evans verbalizes understanding of the findings of todays visit and agrees with plan of treatment. I have discussed any further diagnostic evaluation that may be needed or ordered today. We also reviewed her medications today. she has been encouraged to call the office with any questions or concerns that should arise related to todays visit.    Orders Placed This Encounter  Procedures   MM 3D SCREENING MAMMOGRAM BILATERAL BREAST   CBC with Differential/Platelet   CMP14+EGFR   Lipid Profile   Vitamin D  (25 hydroxy)   POCT glycosylated hemoglobin (Hb A1C)  Meds ordered this encounter  Medications   tirzepatide  (MOUNJARO ) 5 MG/0.5ML Pen    Sig: Inject 5 mg into the skin once a week.    Dispense:  2 mL    Refill:  5    Dx code E11.65, fill new script today   metFORMIN  (GLUCOPHAGE -XR) 500 MG 24 hr tablet    Sig: Take 1 tablet (500 mg total) by mouth daily with breakfast.    Dispense:  90 tablet    Refill:  1   lisinopril -hydrochlorothiazide  (ZESTORETIC ) 20-25 MG tablet    Sig: Take 1 tablet by mouth every morning.     Dispense:  90 tablet    Refill:  0   atorvastatin  (LIPITOR) 10 MG tablet    Sig: Take 1 tablet (10 mg total) by mouth daily.    Dispense:  90 tablet    Refill:  1    For future refills   FLUoxetine  (PROZAC ) 20 MG capsule    Sig: Take 1 capsule (20 mg total) by mouth daily.    Dispense:  90 capsule    Refill:  1   triamcinolone  cream (KENALOG ) 0.1 %    Sig: Apply 1 Application topically 2 (two) times daily. To rash on left leg until resolved.    Dispense:  30 g    Refill:  1    Return in about 1 month (around 08/11/2023) for F/U, Cyntha Brickman PCP, Labs.   Total time spent:30 Minutes Time spent includes review of chart, medications, test results, and follow up plan with the patient.   [place] Controlled Substance Database was reviewed by me.  This patient was seen by Laurence Pons, FNP-C in collaboration with Dr. Verneta Gone as a part of collaborative care agreement.  Veola Cafaro R. Bobbi Burow, MSN, FNP-C Internal medicine

## 2023-07-13 ENCOUNTER — Encounter: Payer: Self-pay | Admitting: Nurse Practitioner

## 2023-07-13 LAB — CMP14+EGFR
ALT: 33 IU/L — ABNORMAL HIGH (ref 0–32)
AST: 28 IU/L (ref 0–40)
Albumin: 4.7 g/dL (ref 3.8–4.9)
Alkaline Phosphatase: 165 IU/L — ABNORMAL HIGH (ref 44–121)
BUN/Creatinine Ratio: 14 (ref 12–28)
BUN: 10 mg/dL (ref 8–27)
Bilirubin Total: 0.3 mg/dL (ref 0.0–1.2)
CO2: 23 mmol/L (ref 20–29)
Calcium: 10.2 mg/dL (ref 8.7–10.3)
Chloride: 96 mmol/L (ref 96–106)
Creatinine, Ser: 0.74 mg/dL (ref 0.57–1.00)
Globulin, Total: 3.3 g/dL (ref 1.5–4.5)
Glucose: 176 mg/dL — ABNORMAL HIGH (ref 70–99)
Potassium: 4.5 mmol/L (ref 3.5–5.2)
Sodium: 139 mmol/L (ref 134–144)
Total Protein: 8 g/dL (ref 6.0–8.5)
eGFR: 93 mL/min/{1.73_m2} (ref >59)

## 2023-07-13 LAB — CBC WITH DIFFERENTIAL/PLATELET
Basophils Absolute: 0.1 10*3/uL (ref 0.0–0.2)
Basos: 1 %
EOS (ABSOLUTE): 0.3 10*3/uL (ref 0.0–0.4)
Eos: 3 %
Hematocrit: 42.3 % (ref 34.0–46.6)
Hemoglobin: 13.6 g/dL (ref 11.1–15.9)
Immature Grans (Abs): 0 10*3/uL (ref 0.0–0.1)
Immature Granulocytes: 0 %
Lymphocytes Absolute: 2.9 10*3/uL (ref 0.7–3.1)
Lymphs: 36 %
MCH: 27.8 pg (ref 26.6–33.0)
MCHC: 32.2 g/dL (ref 31.5–35.7)
MCV: 86 fL (ref 79–97)
Monocytes Absolute: 0.6 10*3/uL (ref 0.1–0.9)
Monocytes: 8 %
Neutrophils Absolute: 4.3 10*3/uL (ref 1.4–7.0)
Neutrophils: 52 %
Platelets: 349 10*3/uL (ref 150–450)
RBC: 4.9 x10E6/uL (ref 3.77–5.28)
RDW: 12.3 % (ref 11.7–15.4)
WBC: 8.1 10*3/uL (ref 3.4–10.8)

## 2023-07-13 LAB — LIPID PANEL
Chol/HDL Ratio: 3.8 ratio (ref 0.0–4.4)
Cholesterol, Total: 153 mg/dL (ref 100–199)
HDL: 40 mg/dL (ref >39)
LDL Chol Calc (NIH): 91 mg/dL (ref 0–99)
Triglycerides: 122 mg/dL (ref 0–149)
VLDL Cholesterol Cal: 22 mg/dL (ref 5–40)

## 2023-07-13 LAB — VITAMIN D 25 HYDROXY (VIT D DEFICIENCY, FRACTURES): Vit D, 25-Hydroxy: 36.9 ng/mL (ref 30.0–100.0)

## 2023-07-19 ENCOUNTER — Encounter: Payer: Self-pay | Admitting: Nurse Practitioner

## 2023-07-21 ENCOUNTER — Encounter: Payer: Self-pay | Admitting: Nurse Practitioner

## 2023-07-21 ENCOUNTER — Other Ambulatory Visit: Payer: Self-pay | Admitting: Nurse Practitioner

## 2023-07-21 ENCOUNTER — Telehealth: Payer: Self-pay

## 2023-07-21 DIAGNOSIS — Z1231 Encounter for screening mammogram for malignant neoplasm of breast: Secondary | ICD-10-CM

## 2023-07-21 NOTE — Telephone Encounter (Signed)
 Pt advised we sent  paperwork

## 2023-07-21 NOTE — Telephone Encounter (Signed)
 Faxed Rx benefit for PA for Mounjaro  [id number]

## 2023-07-22 ENCOUNTER — Telehealth: Payer: Self-pay

## 2023-07-22 NOTE — Telephone Encounter (Signed)
 Called patient to let her know that her Mounjaro  was approved.

## 2023-07-23 ENCOUNTER — Ambulatory Visit: Payer: PRIVATE HEALTH INSURANCE | Admitting: Nurse Practitioner

## 2023-07-23 ENCOUNTER — Ambulatory Visit
Admission: RE | Admit: 2023-07-23 | Discharge: 2023-07-23 | Disposition: A | Discharge status: Home / Self Care | Source: Ambulatory Visit

## 2023-07-23 DIAGNOSIS — Z1231 Encounter for screening mammogram for malignant neoplasm of breast: Secondary | ICD-10-CM

## 2023-08-20 ENCOUNTER — Encounter: Payer: Self-pay | Admitting: Nurse Practitioner

## 2023-08-20 ENCOUNTER — Ambulatory Visit (INDEPENDENT_AMBULATORY_CARE_PROVIDER_SITE_OTHER): Payer: PRIVATE HEALTH INSURANCE | Admitting: Nurse Practitioner

## 2023-08-20 VITALS — BP 130/78 | HR 82 | Temp 98.1°F | Resp 16 | Ht 65.0 in | Wt 188.2 lb

## 2023-08-20 DIAGNOSIS — E559 Vitamin D deficiency, unspecified: Secondary | ICD-10-CM | POA: Diagnosis not present

## 2023-08-20 DIAGNOSIS — E1159 Type 2 diabetes mellitus with other circulatory complications: Secondary | ICD-10-CM | POA: Diagnosis not present

## 2023-08-20 DIAGNOSIS — E66811 Obesity, class 1: Secondary | ICD-10-CM

## 2023-08-20 DIAGNOSIS — E785 Hyperlipidemia, unspecified: Secondary | ICD-10-CM

## 2023-08-20 DIAGNOSIS — Z6831 Body mass index (BMI) 31.0-31.9, adult: Secondary | ICD-10-CM

## 2023-08-20 DIAGNOSIS — I152 Hypertension secondary to endocrine disorders: Secondary | ICD-10-CM

## 2023-08-20 DIAGNOSIS — E1169 Type 2 diabetes mellitus with other specified complication: Secondary | ICD-10-CM

## 2023-08-20 DIAGNOSIS — E6609 Other obesity due to excess calories: Secondary | ICD-10-CM

## 2023-08-20 NOTE — Progress Notes (Signed)
 [facility] [address] [place], [place] [address]  Internal MEDICINE  Office Visit Note  Patient Name: Tabitha Evans  [id number]  [id number]  Date of Service: 08/20/2023  Chief Complaint  Patient presents with   Diabetes   Gastroesophageal Reflux   Hypertension   Hyperlipidemia   Follow-up    HPI Tabitha Evans presents for a follow-up visit for diabetes, high cholesterol, elevated liver enzymes, and vitamin D  deficiency.  Diabetes -- last A1c was elevated in June at 9.8. patient has been able to get back on her medication since it has been approved by her insurance.  High cholesterol -- labs were normal Slightly elevated liver enzymes -- will be improved next time now that she has restarted the mounjaro .  Vitamin D  is now in normal range.     Current Medication: Outpatient Encounter Medications as of 08/20/2023  Medication Sig   atorvastatin  (LIPITOR) 10 MG tablet Take 1 tablet (10 mg total) by mouth daily.   FLUoxetine  (PROZAC ) 20 MG capsule Take 1 capsule (20 mg total) by mouth daily.   lisinopril -hydrochlorothiazide  (ZESTORETIC ) 20-25 MG tablet Take 1 tablet by mouth every morning.   metFORMIN  (GLUCOPHAGE -XR) 500 MG 24 hr tablet Take 1 tablet (500 mg total) by mouth daily with breakfast.   omeprazole  (PRILOSEC) 20 MG capsule Take 20 mg by mouth daily.   tirzepatide  (MOUNJARO ) 5 MG/0.5ML Pen Inject 5 mg into the skin once a week.   triamcinolone  cream (KENALOG ) 0.1 % Apply 1 Application topically 2 (two) times daily. To rash on left leg until resolved.   Vitamin D , Ergocalciferol , (DRISDOL ) 1.25 MG (50000 UNIT) CAPS capsule Take 1 capsule (50,000 Units total) by mouth every 7 (seven) days.   No facility-administered encounter medications on file as of 08/20/2023.    Surgical History: Past Surgical History:  Procedure Laterality Date   BREAST BIOPSY Right 2014   benign   COLONOSCOPY     COLONOSCOPY WITH PROPOFOL  N/A 10/01/2017   Procedure: COLONOSCOPY WITH  PROPOFOL ;  Surgeon: Janalyn Keene NOVAK, MD;  Location: [facility] ENDOSCOPY;  Service: Endoscopy;  Laterality: N/A;   ESOPHAGOGASTRODUODENOSCOPY N/A 02/26/2017   Procedure: ESOPHAGOGASTRODUODENOSCOPY (EGD);  Surgeon: Janalyn Keene NOVAK, MD;  Location: [facility] ENDOSCOPY;  Service: Endoscopy;  Laterality: N/A;   ESOPHAGOGASTRODUODENOSCOPY (EGD) WITH PROPOFOL  N/A 10/01/2017   Procedure: ESOPHAGOGASTRODUODENOSCOPY (EGD) WITH PROPOFOL ;  Surgeon: Janalyn Keene NOVAK, MD;  Location: [facility] ENDOSCOPY;  Service: Endoscopy;  Laterality: N/A;   UPPER GASTROINTESTINAL ENDOSCOPY      Medical History: Past Medical History:  Diagnosis Date   Acid reflux    Acute gastric ulcer with hemorrhage    Acute posthemorrhagic anemia    Anxiety disorder    Benign hypertensive renal disease    Chronic peptic ulcer of stomach    Colon polyp    Controlled type 2 diabetes mellitus with renal manifestation (HCC)    Controlled type 2 diabetes mellitus with renal manifestation (HCC)    Frequent headaches    Heart murmur    Hyperlipidemia associated with type 2 diabetes mellitus (HCC) 03/26/2023   Hypertension    Iron deficiency anemia    Lower GI bleed 02/25/2017   Pre-diabetes    Stomach irritation     Family History: Family History  Problem Relation Age of Onset   Hypertension Mother    Hyperlipidemia Mother    Glaucoma Mother    Depression Mother    Liver disease Father    Cancer Father    Glaucoma Sister  Glaucoma Sister    Vision loss Sister    Dementia Maternal Grandmother    Hypertension Brother    Hypertension Brother    Kidney disease Brother    Vision loss Brother    Breast cancer Neg Hx     Social History   Socioeconomic History   Marital status: Single    Spouse name: Not on file   Number of children: Not on file   Years of education: Not on file   Highest education level: Not on file  Occupational History   Not on file  Tobacco Use   Smoking status: Never   Smokeless tobacco: Never   Vaping Use   Vaping status: Never Used  Substance and Sexual Activity   Alcohol use: Not Currently    Alcohol/week: 3.0 standard drinks of alcohol    Types: 3 Cans of beer per week    Comment: on the weekends   Drug use: No   Sexual activity: Not Currently    Birth control/protection: Abstinence  Other Topics Concern   Not on file  Social History Narrative   Not on file   Social Drivers of Health   Financial Resource Strain: Low Risk  (03/27/2021)   Received from [facility]   Overall Financial Resource Strain (CARDIA)    Difficulty of Paying Living Expenses: Not hard at all  Food Insecurity: No Food Insecurity (03/27/2021)   Received from [facility]   Hunger Vital Sign    Within the past 12 months, you worried that your food would run out before you got the money to buy more.: Never true    Within the past 12 months, the food you bought just didn't last and you didn't have money to get more.: Never true  Transportation Needs: No Transportation Needs (03/27/2021)   Received from [facility]   PRAPARE - Transportation    Lack of Transportation (Medical): No    Lack of Transportation (Non-Medical): No  Physical Activity: Not on file  Stress: Not on file  Social Connections: Not on file  Intimate Partner Violence: Not on file      Review of Systems  Constitutional:  Negative for chills, fatigue and unexpected weight change.  HENT:  Positive for postnasal drip. Negative for congestion, ear pain, rhinorrhea, sneezing and sore throat.   Eyes:  Negative for redness.  Respiratory:  Negative for cough, chest tightness, shortness of breath and wheezing.   Cardiovascular: Negative.  Negative for chest pain and palpitations.  Gastrointestinal: Negative.  Negative for abdominal pain, constipation, diarrhea, nausea and vomiting.  Genitourinary:  Negative for dysuria and frequency.  Musculoskeletal:  Negative for arthralgias, back pain, joint swelling and neck pain.   Skin:  Negative for rash.  Neurological: Negative.  Negative for tremors, numbness and headaches.  Hematological:  Negative for adenopathy. Does not bruise/bleed easily.  Psychiatric/Behavioral:  Negative for behavioral problems (Depression), self-injury, sleep disturbance and suicidal ideas. The patient is nervous/anxious (takes fluoxetine ).     Vital Signs: BP 130/78   Pulse 82   Temp 98.1 F (36.7 C)   Resp 16   Ht 5' 5 (1.651 m)   Wt 188 lb 3.2 oz (85.4 kg)   LMP 09/01/2014 (Approximate)   SpO2 97%   BMI 31.32 kg/m    Physical Exam Vitals reviewed.  Constitutional:      General: She is not in acute distress.    Appearance: Normal appearance. She is obese. She is not ill-appearing.  HENT:  Head: Normocephalic and atraumatic.  Eyes:     Pupils: Pupils are equal, round, and reactive to light.  Cardiovascular:     Rate and Rhythm: Normal rate and regular rhythm.  Pulmonary:     Effort: Pulmonary effort is normal. No respiratory distress.  Neurological:     Mental Status: She is alert and oriented to person, place, and time.  Psychiatric:        Mood and Affect: Mood normal.        Behavior: Behavior normal.        Assessment/Plan: 1. Type 2 diabetes mellitus with other specified complication, without long-term current use of insulin (HCC) (Primary) Continue mounjaro  and metformin  as prescribed. Repeat A1c in 2-3 months.   2. Hypertension associated with diabetes (HCC) Stable, continue lisinopril -hydrochlorothiazide  as prescribed.   3. Hyperlipidemia associated with type 2 diabetes mellitus (HCC) Cholesterol panel is normal, continue atorvastatin  as prescribed.   4. Vitamin D  deficiency Continue vitamin D  supplement.   5. Class 1 obesity due to excess calories with serious comorbidity and body mass index (BMI) of 31.0 to 31.9 in adult Continue mounjaro  as prescribed.    General Counseling: Tabitha Evans understanding of the findings of todays  visit and agrees with plan of treatment. I have discussed any further diagnostic evaluation that may be needed or ordered today. We also reviewed her medications today. she has been encouraged to call the office with any questions or concerns that should arise related to todays visit.    No orders of the defined types were placed in this encounter.   No orders of the defined types were placed in this encounter.   Return in about 3 months (around 11/20/2023) for F/U, Recheck A1C, Tabitha Evans PCP.   Total time spent:30 Minutes Time spent includes review of chart, medications, test results, and follow up plan with the patient.   [place] Controlled Substance Database was reviewed by me.  This patient was seen by Tabitha Maxin, FNP-C in collaboration with Dr. Sigrid Evans as a part of collaborative care agreement.   Tabitha Blizard R. Maxin, MSN, FNP-C Internal medicine

## 2023-09-19 ENCOUNTER — Encounter: Payer: Self-pay | Admitting: Nurse Practitioner

## 2023-09-19 DIAGNOSIS — E1159 Type 2 diabetes mellitus with other circulatory complications: Secondary | ICD-10-CM | POA: Insufficient documentation

## 2023-09-19 DIAGNOSIS — E559 Vitamin D deficiency, unspecified: Secondary | ICD-10-CM | POA: Insufficient documentation

## 2023-11-26 ENCOUNTER — Encounter: Payer: Self-pay | Admitting: Internal Medicine

## 2023-11-26 ENCOUNTER — Ambulatory Visit: Payer: PRIVATE HEALTH INSURANCE | Admitting: Internal Medicine

## 2023-11-26 VITALS — BP 118/80 | HR 76 | Temp 96.6°F | Resp 16 | Ht 65.0 in | Wt 180.2 lb

## 2023-11-26 DIAGNOSIS — E785 Hyperlipidemia, unspecified: Secondary | ICD-10-CM | POA: Diagnosis not present

## 2023-11-26 DIAGNOSIS — Z23 Encounter for immunization: Secondary | ICD-10-CM | POA: Diagnosis not present

## 2023-11-26 DIAGNOSIS — E1169 Type 2 diabetes mellitus with other specified complication: Secondary | ICD-10-CM

## 2023-11-26 LAB — POCT GLYCOSYLATED HEMOGLOBIN (HGB A1C): Hemoglobin A1C: 6 % — AB (ref 4.0–5.6)

## 2023-11-26 MED ORDER — BLOOD GLUCOSE TEST VI STRP: 1.0000 | ORAL_STRIP | 0 refills | Status: AC

## 2023-11-26 MED ORDER — BLOOD GLUCOSE MONITORING SUPPL DEVI: 1.0000 | 0 refills | Status: AC

## 2023-11-26 MED ORDER — LANCETS MISC: 1.0000 | 0 refills | Status: AC

## 2023-11-26 MED ORDER — LANCET DEVICE MISC: 1.0000 | 0 refills | Status: AC

## 2023-11-26 NOTE — Progress Notes (Unsigned)
 [facility] [address] [place], [place] [address]  Internal MEDICINE  Office Visit Note  Patient Name: Tabitha Evans  [id number]  [id number]  Date of Service: 11/29/2023  Chief Complaint  Patient presents with   Diabetes   Gastroesophageal Reflux   Hyperlipidemia   Hypertension   Follow-up    HPI Patient is seen for routine follow-up visit 1.  Blood pressure is well-controlled, she is on lisinopril  and hydrochlorothiazide  2.  Patient was started on Mounjaro  on 5 mg once a week, her hemoglobin A1c is drastically improved 3.  Patient is able to lose 8 pounds in the last 3 months    Current Medication: Outpatient Encounter Medications as of 11/26/2023  Medication Sig   Blood Glucose Monitoring Suppl DEVI 1 each by Does not apply route as directed. Dispense based on patient and insurance preference. Use up to four times daily as directed. (FOR ICD-10 E10.9, E11.9).   Glucose Blood (BLOOD GLUCOSE TEST STRIPS) STRP 1 each by Does not apply route as directed. Dispense based on patient and insurance preference. Use up to four times daily as directed. (FOR ICD-10 E10.9, E11.9).   Lancet Device MISC 1 each by Does not apply route as directed. Dispense based on patient and insurance preference. Use up to four times daily as directed. (FOR ICD-10 E10.9, E11.9).   Lancets MISC 1 each by Does not apply route as directed. Dispense based on patient and insurance preference. Use up to four times daily as directed. (FOR ICD-10 E10.9, E11.9).   atorvastatin  (LIPITOR) 10 MG tablet Take 1 tablet (10 mg total) by mouth daily.   FLUoxetine  (PROZAC ) 20 MG capsule Take 1 capsule (20 mg total) by mouth daily.   lisinopril -hydrochlorothiazide  (ZESTORETIC ) 20-25 MG tablet Take 1 tablet by mouth every morning.   metFORMIN  (GLUCOPHAGE -XR) 500 MG 24 hr tablet Take 1 tablet (500 mg total) by mouth daily with breakfast.   omeprazole  (PRILOSEC) 20 MG capsule Take 20 mg by mouth daily.    tirzepatide  (MOUNJARO ) 5 MG/0.5ML Pen Inject 5 mg into the skin once a week.   triamcinolone  cream (KENALOG ) 0.1 % Apply 1 Application topically 2 (two) times daily. To rash on left leg until resolved. (Patient not taking: Reported on 11/26/2023)   Vitamin D , Ergocalciferol , (DRISDOL ) 1.25 MG (50000 UNIT) CAPS capsule Take 1 capsule (50,000 Units total) by mouth every 7 (seven) days. (Patient not taking: Reported on 11/26/2023)   No facility-administered encounter medications on file as of 11/26/2023.    Surgical History: Past Surgical History:  Procedure Laterality Date   BREAST BIOPSY Right 2014   benign   COLONOSCOPY     COLONOSCOPY WITH PROPOFOL  N/A 10/01/2017   Procedure: COLONOSCOPY WITH PROPOFOL ;  Surgeon: Janalyn Keene NOVAK, MD;  Location: [facility] ENDOSCOPY;  Service: Endoscopy;  Laterality: N/A;   ESOPHAGOGASTRODUODENOSCOPY N/A 02/26/2017   Procedure: ESOPHAGOGASTRODUODENOSCOPY (EGD);  Surgeon: Janalyn Keene NOVAK, MD;  Location: [facility] ENDOSCOPY;  Service: Endoscopy;  Laterality: N/A;   ESOPHAGOGASTRODUODENOSCOPY (EGD) WITH PROPOFOL  N/A 10/01/2017   Procedure: ESOPHAGOGASTRODUODENOSCOPY (EGD) WITH PROPOFOL ;  Surgeon: Janalyn Keene NOVAK, MD;  Location: [facility] ENDOSCOPY;  Service: Endoscopy;  Laterality: N/A;   UPPER GASTROINTESTINAL ENDOSCOPY      Medical History: Past Medical History:  Diagnosis Date   Acid reflux    Acute gastric ulcer with hemorrhage    Acute posthemorrhagic anemia    Anxiety disorder    Benign hypertensive renal disease    Chronic peptic ulcer of stomach    Colon polyp  Controlled type 2 diabetes mellitus with renal manifestation (HCC)    Controlled type 2 diabetes mellitus with renal manifestation (HCC)    Frequent headaches    Heart murmur    Hyperlipidemia associated with type 2 diabetes mellitus (HCC) 03/26/2023   Hypertension    Iron deficiency anemia    Lower GI bleed 02/25/2017   Pre-diabetes    Stomach irritation     Family History: Family  History  Problem Relation Age of Onset   Hypertension Mother    Hyperlipidemia Mother    Glaucoma Mother    Depression Mother    Liver disease Father    Cancer Father    Glaucoma Sister    Glaucoma Sister    Vision loss Sister    Dementia Maternal Grandmother    Hypertension Brother    Hypertension Brother    Kidney disease Brother    Vision loss Brother    Breast cancer Neg Hx     Social History   Socioeconomic History   Marital status: Single    Spouse name: Not on file   Number of children: Not on file   Years of education: Not on file   Highest education level: Not on file  Occupational History   Not on file  Tobacco Use   Smoking status: Never   Smokeless tobacco: Never  Vaping Use   Vaping status: Never Used  Substance and Sexual Activity   Alcohol use: Not Currently    Alcohol/week: 3.0 standard drinks of alcohol    Types: 3 Cans of beer per week    Comment: on the weekends   Drug use: No   Sexual activity: Not Currently    Birth control/protection: Abstinence  Other Topics Concern   Not on file  Social History Narrative   Not on file   Social Drivers of Health   Financial Resource Strain: Low Risk  (03/27/2021)   Received from [facility]   Overall Financial Resource Strain (CARDIA)    Difficulty of Paying Living Expenses: Not hard at all  Food Insecurity: No Food Insecurity (03/27/2021)   Received from [facility]   Hunger Vital Sign    Within the past 12 months, you worried that your food would run out before you got the money to buy more.: Never true    Within the past 12 months, the food you bought just didn't last and you didn't have money to get more.: Never true  Transportation Needs: No Transportation Needs (03/27/2021)   Received from [facility]   PRAPARE - Transportation    Lack of Transportation (Medical): No    Lack of Transportation (Non-Medical): No  Physical Activity: Not on file  Stress: Not on file  Social  Connections: Not on file  Intimate Partner Violence: Not on file      Review of Systems  Constitutional:  Negative for chills, fatigue and unexpected weight change.  HENT:  Positive for postnasal drip. Negative for congestion, rhinorrhea, sneezing and sore throat.   Eyes:  Negative for redness.  Respiratory:  Negative for cough, chest tightness and shortness of breath.   Cardiovascular:  Negative for chest pain and palpitations.  Gastrointestinal:  Negative for abdominal pain, constipation, diarrhea, nausea and vomiting.  Genitourinary:  Negative for dysuria and frequency.  Musculoskeletal:  Negative for arthralgias, back pain, joint swelling and neck pain.  Skin:  Negative for rash.  Neurological: Negative.  Negative for tremors and numbness.  Hematological:  Negative for adenopathy.  Does not bruise/bleed easily.  Psychiatric/Behavioral:  Negative for behavioral problems (Depression), sleep disturbance and suicidal ideas. The patient is not nervous/anxious.     Vital Signs: BP 118/80   Pulse 76   Temp (!) 96.6 F (35.9 C)   Resp 16   Ht 5' 5 (1.651 m)   Wt 180 lb 3.2 oz (81.7 kg)   LMP 09/01/2014 (Approximate)   SpO2 97%   BMI 29.99 kg/m    Physical Exam Constitutional:      Appearance: Normal appearance.  HENT:     Head: Normocephalic and atraumatic.     Nose: Nose normal.     Mouth/Throat:     Mouth: Mucous membranes are moist.     Pharynx: No posterior oropharyngeal erythema.  Eyes:     Extraocular Movements: Extraocular movements intact.     Pupils: Pupils are equal, round, and reactive to light.  Cardiovascular:     Pulses: Normal pulses.     Heart sounds: Normal heart sounds.  Pulmonary:     Effort: Pulmonary effort is normal.     Breath sounds: Normal breath sounds.  Neurological:     General: No focal deficit present.     Mental Status: She is alert.  Psychiatric:        Mood and Affect: Mood normal.        Behavior: Behavior normal.         Assessment/Plan: 1. Hyperlipidemia associated with type 2 diabetes mellitus (HCC) Will continue her Lipitor as before  2. Type 2 diabetes mellitus with other specified complication, without long-term current use of insulin (HCC) (Primary) Patient is to continue on Mounjaro  - POCT glycosylated hemoglobin (Hb A1C)  3. Need for shingles vaccine This is ordered to the pharmacy - Zoster, Recombinant (Shingrix)  4. Need for vaccination against Streptococcus pneumoniae Ordered through the pharmacy - Pneumococcal conjugate vaccine 20-valent (PCV20)  5. Needs flu shot This is here in the office - Influenza, MDCK, trivalent, PF(Flucelvax egg-free)   General Counseling: Olivia oakland understanding of the findings of todays visit and agrees with plan of treatment. I have discussed any further diagnostic evaluation that may be needed or ordered today. We also reviewed her medications today. she has been encouraged to call the office with any questions or concerns that should arise related to todays visit.    Orders Placed This Encounter  Procedures   Zoster, Recombinant (Shingrix)   Pneumococcal conjugate vaccine 20-valent (PCV20)   Influenza, MDCK, trivalent, PF(Flucelvax egg-free)   POCT glycosylated hemoglobin (Hb A1C)    Meds ordered this encounter  Medications   Blood Glucose Monitoring Suppl DEVI    Sig: 1 each by Does not apply route as directed. Dispense based on patient and insurance preference. Use up to four times daily as directed. (FOR ICD-10 E10.9, E11.9).    Dispense:  1 each    Refill:  0   Glucose Blood (BLOOD GLUCOSE TEST STRIPS) STRP    Sig: 1 each by Does not apply route as directed. Dispense based on patient and insurance preference. Use up to four times daily as directed. (FOR ICD-10 E10.9, E11.9).    Dispense:  100 strip    Refill:  0   Lancet Device MISC    Sig: 1 each by Does not apply route as directed. Dispense based on patient and insurance  preference. Use up to four times daily as directed. (FOR ICD-10 E10.9, E11.9).    Dispense:  1 each    Refill:  0   Lancets MISC    Sig: 1 each by Does not apply route as directed. Dispense based on patient and insurance preference. Use up to four times daily as directed. (FOR ICD-10 E10.9, E11.9).    Dispense:  100 each    Refill:  0    Total time spent:30 Minutes Time spent includes review of chart, medications, test results, and follow up plan with the patient.   [place] Controlled Substance Database was reviewed by me.   Dr Khole Arterburn M Marnee Sherrard Internal medicine

## 2023-12-20 ENCOUNTER — Encounter: Payer: Self-pay | Admitting: Nurse Practitioner

## 2023-12-27 ENCOUNTER — Other Ambulatory Visit: Payer: Self-pay | Admitting: Nurse Practitioner

## 2023-12-27 DIAGNOSIS — E1169 Type 2 diabetes mellitus with other specified complication: Secondary | ICD-10-CM

## 2024-01-25 ENCOUNTER — Other Ambulatory Visit: Payer: Self-pay | Admitting: Nurse Practitioner

## 2024-01-25 DIAGNOSIS — E1169 Type 2 diabetes mellitus with other specified complication: Secondary | ICD-10-CM

## 2024-01-28 ENCOUNTER — Ambulatory Visit (INDEPENDENT_AMBULATORY_CARE_PROVIDER_SITE_OTHER): Admitting: Nurse Practitioner

## 2024-01-28 ENCOUNTER — Encounter: Payer: Self-pay | Admitting: Nurse Practitioner

## 2024-01-28 VITALS — BP 122/78 | HR 86 | Temp 96.5°F | Resp 16 | Ht 65.0 in | Wt 176.4 lb

## 2024-01-28 DIAGNOSIS — E785 Hyperlipidemia, unspecified: Secondary | ICD-10-CM | POA: Diagnosis not present

## 2024-01-28 DIAGNOSIS — E1169 Type 2 diabetes mellitus with other specified complication: Secondary | ICD-10-CM | POA: Diagnosis not present

## 2024-01-28 DIAGNOSIS — I152 Hypertension secondary to endocrine disorders: Secondary | ICD-10-CM | POA: Diagnosis not present

## 2024-01-28 DIAGNOSIS — E1159 Type 2 diabetes mellitus with other circulatory complications: Secondary | ICD-10-CM

## 2024-01-28 MED ORDER — TIRZEPATIDE 7.5 MG/0.5ML ~~LOC~~ SOAJ: 7.5000 mg | SUBCUTANEOUS | 5 refills | Status: AC

## 2024-01-28 NOTE — Progress Notes (Signed)
 [facility] [address] [place], [place] [address]  Internal MEDICINE  Office Visit Note  Patient Name: Tabitha Evans  [id number]  [id number]  Date of Service: 01/28/2024  Chief Complaint  Patient presents with   Diabetes   Gastroesophageal Reflux   Hypertension   Hyperlipidemia   Follow-up    HPI Tabitha Evans presents for a follow-up visit for diabetes, hypertension, high cholesterol, weight loss.  Diabetes -- A1c is stable at 6.0, on metformin  and mounjaro   Hypertension -- controlled on current medications.  High cholesterol -- on statin therapy  Weight loss -- lost 4 more lbs since last office visit, patient is ready to increase mounjaro  dose.     Current Medication: Outpatient Encounter Medications as of 01/28/2024  Medication Sig   tirzepatide  (MOUNJARO ) 7.5 MG/0.5ML Pen Inject 7.5 mg into the skin once a week.   atorvastatin  (LIPITOR) 10 MG tablet Take 1 tablet (10 mg total) by mouth daily.   Blood Glucose Monitoring Suppl DEVI 1 each by Does not apply route as directed. Dispense based on patient and insurance preference. Use up to four times daily as directed. (FOR ICD-10 E10.9, E11.9).   FLUoxetine  (PROZAC ) 20 MG capsule Take 1 capsule (20 mg total) by mouth daily.   Glucose Blood (BLOOD GLUCOSE TEST STRIPS) STRP 1 each by Does not apply route as directed. Dispense based on patient and insurance preference. Use up to four times daily as directed. (FOR ICD-10 E10.9, E11.9).   Lancet Device MISC 1 each by Does not apply route as directed. Dispense based on patient and insurance preference. Use up to four times daily as directed. (FOR ICD-10 E10.9, E11.9).   Lancets MISC 1 each by Does not apply route as directed. Dispense based on patient and insurance preference. Use up to four times daily as directed. (FOR ICD-10 E10.9, E11.9).   lisinopril -hydrochlorothiazide  (ZESTORETIC ) 20-25 MG tablet Take 1 tablet by mouth every morning.   metFORMIN  (GLUCOPHAGE -XR) 500  MG 24 hr tablet Take 1 tablet (500 mg total) by mouth daily with breakfast.   omeprazole  (PRILOSEC) 20 MG capsule Take 20 mg by mouth daily.   triamcinolone  cream (KENALOG ) 0.1 % Apply 1 Application topically 2 (two) times daily. To rash on left leg until resolved. (Patient not taking: Reported on 11/26/2023)   Vitamin D , Ergocalciferol , (DRISDOL ) 1.25 MG (50000 UNIT) CAPS capsule Take 1 capsule (50,000 Units total) by mouth every 7 (seven) days. (Patient not taking: Reported on 11/26/2023)   [DISCONTINUED] MOUNJARO  5 MG/0.5ML Pen INJECT 5MG  SUBCUTANEOUSLY ONCE A WEEK   No facility-administered encounter medications on file as of 01/28/2024.    Surgical History: Past Surgical History:  Procedure Laterality Date   BREAST BIOPSY Right 2014   benign   COLONOSCOPY     COLONOSCOPY WITH PROPOFOL  N/A 10/01/2017   Procedure: COLONOSCOPY WITH PROPOFOL ;  Surgeon: Janalyn Keene NOVAK, MD;  Location: [facility] ENDOSCOPY;  Service: Endoscopy;  Laterality: N/A;   ESOPHAGOGASTRODUODENOSCOPY N/A 02/26/2017   Procedure: ESOPHAGOGASTRODUODENOSCOPY (EGD);  Surgeon: Janalyn Keene NOVAK, MD;  Location: [facility] ENDOSCOPY;  Service: Endoscopy;  Laterality: N/A;   ESOPHAGOGASTRODUODENOSCOPY (EGD) WITH PROPOFOL  N/A 10/01/2017   Procedure: ESOPHAGOGASTRODUODENOSCOPY (EGD) WITH PROPOFOL ;  Surgeon: Janalyn Keene NOVAK, MD;  Location: [facility] ENDOSCOPY;  Service: Endoscopy;  Laterality: N/A;   UPPER GASTROINTESTINAL ENDOSCOPY      Medical History: Past Medical History:  Diagnosis Date   Acid reflux    Acute gastric ulcer with hemorrhage    Acute posthemorrhagic anemia    Anxiety disorder  Benign hypertensive renal disease    Chronic peptic ulcer of stomach    Colon polyp    Controlled type 2 diabetes mellitus with renal manifestation (HCC)    Controlled type 2 diabetes mellitus with renal manifestation (HCC)    Frequent headaches    Heart murmur    Hyperlipidemia associated with type 2 diabetes mellitus (HCC)  03/26/2023   Hypertension    Iron deficiency anemia    Lower GI bleed 02/25/2017   Pre-diabetes    Stomach irritation     Family History: Family History  Problem Relation Age of Onset   Hypertension Mother    Hyperlipidemia Mother    Glaucoma Mother    Depression Mother    Liver disease Father    Cancer Father    Glaucoma Sister    Glaucoma Sister    Vision loss Sister    Dementia Maternal Grandmother    Hypertension Brother    Hypertension Brother    Kidney disease Brother    Vision loss Brother    Breast cancer Neg Hx     Social History   Socioeconomic History   Marital status: Single    Spouse name: Not on file   Number of children: Not on file   Years of education: Not on file   Highest education level: Not on file  Occupational History   Not on file  Tobacco Use   Smoking status: Never   Smokeless tobacco: Never  Vaping Use   Vaping status: Never Used  Substance and Sexual Activity   Alcohol use: Not Currently    Alcohol/week: 3.0 standard drinks of alcohol    Types: 3 Cans of beer per week    Comment: on the weekends   Drug use: No   Sexual activity: Not Currently    Birth control/protection: Abstinence  Other Topics Concern   Not on file  Social History Narrative   Not on file   Social Drivers of Health   Tobacco Use: Low Risk (01/28/2024)   Patient History    Smoking Tobacco Use: Never    Smokeless Tobacco Use: Never    Passive Exposure: Not on file  Financial Resource Strain: Low Risk (03/27/2021)   Received from [facility]   Overall Financial Resource Strain (CARDIA)    Difficulty of Paying Living Expenses: Not hard at all  Food Insecurity: No Food Insecurity (03/27/2021)   Received from [facility]   Epic    Within the past 12 months, you worried that your food would run out before you got the money to buy more.: Never true    Within the past 12 months, the food you bought just didn't last and you didn't have money to get more.:  Never true  Transportation Needs: No Transportation Needs (03/27/2021)   [facility]   PRAPARE - Transportation    Lack of Transportation (Medical): No    Lack of Transportation (Non-Medical): No  Physical Activity: Not on file  Stress: Not on file  Social Connections: Not on file  Intimate Partner Violence: Not on file  Depression (PHQ2-9): Low Risk (07/12/2023)   Depression (PHQ2-9)    PHQ-2 Score: 0  Alcohol Screen: Low Risk (09/04/2021)   Alcohol Screen    Last Alcohol Screening Score (AUDIT): 0  Housing: Not on file  Utilities: Not on file  Health Literacy: Not on file      Review of Systems  Constitutional:  Positive for unexpected weight change. Negative  for chills and fatigue.  HENT:  Positive for postnasal drip. Negative for congestion, ear pain, rhinorrhea, sneezing and sore throat.   Eyes:  Negative for redness.  Respiratory:  Negative for cough, chest tightness, shortness of breath and wheezing.   Cardiovascular: Negative.  Negative for chest pain and palpitations.  Gastrointestinal: Negative.  Negative for abdominal pain, constipation, diarrhea, nausea and vomiting.  Genitourinary:  Negative for dysuria and frequency.  Musculoskeletal:  Negative for arthralgias, back pain, joint swelling and neck pain.  Skin:  Negative for rash.  Neurological: Negative.  Negative for tremors, numbness and headaches.  Hematological:  Negative for adenopathy. Does not bruise/bleed easily.  Psychiatric/Behavioral:  Negative for behavioral problems (Depression), self-injury, sleep disturbance and suicidal ideas. The patient is nervous/anxious (takes fluoxetine ).     Vital Signs: BP 122/78   Pulse 86   Temp (!) 96.5 F (35.8 C)   Resp 16   Ht 5' 5 (1.651 m)   Wt 176 lb 6.4 oz (80 kg)   LMP 09/01/2014   SpO2 98%   BMI 29.35 kg/m    Physical Exam Vitals reviewed.  Constitutional:      General: She is not in acute distress.    Appearance: Normal appearance.  She is obese. She is not ill-appearing.  HENT:     Head: Normocephalic and atraumatic.  Eyes:     Pupils: Pupils are equal, round, and reactive to light.  Cardiovascular:     Rate and Rhythm: Normal rate and regular rhythm.  Pulmonary:     Effort: Pulmonary effort is normal. No respiratory distress.  Neurological:     Mental Status: She is alert and oriented to person, place, and time.  Psychiatric:        Mood and Affect: Mood normal.        Behavior: Behavior normal.        Assessment/Plan: 1. Type 2 diabetes mellitus with other specified complication, without long-term current use of insulin (HCC) (Primary) Mounjaro  dose increased. A1c is stable. Continue medications as prescribed.  - tirzepatide  (MOUNJARO ) 7.5 MG/0.5ML Pen; Inject 7.5 mg into the skin once a week.  Dispense: 2 mL; Refill: 5  2. Hypertension associated with diabetes (HCC) Stable, continue medications as prescribed.   3. Hyperlipidemia associated with type 2 diabetes mellitus (HCC) Continue atorvastatin  as prescribed.    General Counseling: Tabitha Evans understanding of the findings of todays visit and agrees with plan of treatment. I have discussed any further diagnostic evaluation that may be needed or ordered today. We also reviewed her medications today. she has been encouraged to call the office with any questions or concerns that should arise related to todays visit.    No orders of the defined types were placed in this encounter.   Meds ordered this encounter  Medications   tirzepatide  (MOUNJARO ) 7.5 MG/0.5ML Pen    Sig: Inject 7.5 mg into the skin once a week.    Dispense:  2 mL    Refill:  5    Note increase in dose, please fill new script today. Discontinue 5 mg dose.    Return in about 2 months (around 03/30/2024) for F/U, Recheck A1C, Tabitha Evans PCP.   Total time spent:30 Minutes Time spent includes review of chart, medications, test results, and follow up plan with the patient.    [place] Controlled Substance Database was reviewed by me.  This patient was seen by Mardy Maxin, FNP-C in collaboration with Dr. Sigrid Bathe as a part of collaborative care  agreement.   Konrad Hoak R. Liana, MSN, FNP-C Internal medicine

## 2024-02-11 ENCOUNTER — Encounter: Payer: Self-pay | Admitting: Nurse Practitioner

## 2024-03-12 ENCOUNTER — Other Ambulatory Visit: Payer: Self-pay | Admitting: Nurse Practitioner

## 2024-03-12 DIAGNOSIS — E1159 Type 2 diabetes mellitus with other circulatory complications: Secondary | ICD-10-CM

## 2024-03-31 ENCOUNTER — Ambulatory Visit: Admitting: Nurse Practitioner

## 2024-07-12 ENCOUNTER — Encounter: Payer: PRIVATE HEALTH INSURANCE | Admitting: Nurse Practitioner
